# Patient Record
Sex: Female | Born: 1965 | Race: Black or African American | Hispanic: No | Marital: Married | Smoking: Never smoker
Health system: Southern US, Community
[De-identification: ages and names within clinical notes are randomized; demographics above are authoritative.]

## PROBLEM LIST (undated history)

## (undated) DIAGNOSIS — E7800 Pure hypercholesterolemia, unspecified: Secondary | ICD-10-CM

## (undated) DIAGNOSIS — I10 Essential (primary) hypertension: Secondary | ICD-10-CM

## (undated) DIAGNOSIS — E119 Type 2 diabetes mellitus without complications: Secondary | ICD-10-CM

## (undated) HISTORY — PX: TUBAL LIGATION: SHX77

## (undated) HISTORY — DX: Essential (primary) hypertension: I10

---
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
## 2005-11-16 ENCOUNTER — Ambulatory Visit: Payer: Self-pay | Admitting: Unknown Physician Specialty

## 2005-11-24 ENCOUNTER — Other Ambulatory Visit: Payer: Self-pay

## 2005-11-24 ENCOUNTER — Emergency Department: Payer: Self-pay | Admitting: Emergency Medicine

## 2006-01-23 ENCOUNTER — Ambulatory Visit: Payer: Self-pay | Admitting: Family Medicine

## 2008-03-24 ENCOUNTER — Ambulatory Visit: Payer: Self-pay | Admitting: Family Medicine

## 2008-04-27 ENCOUNTER — Ambulatory Visit: Payer: Self-pay | Admitting: Gastroenterology

## 2008-11-25 ENCOUNTER — Ambulatory Visit: Payer: Self-pay | Admitting: Family Medicine

## 2009-06-03 ENCOUNTER — Emergency Department: Payer: Self-pay | Admitting: Emergency Medicine

## 2009-09-16 ENCOUNTER — Ambulatory Visit: Payer: Self-pay | Admitting: Unknown Physician Specialty

## 2010-03-11 ENCOUNTER — Ambulatory Visit: Payer: Self-pay | Admitting: Emergency Medicine

## 2010-03-16 ENCOUNTER — Ambulatory Visit: Payer: Self-pay | Admitting: Emergency Medicine

## 2010-12-08 ENCOUNTER — Ambulatory Visit: Payer: Self-pay | Admitting: Family Medicine

## 2011-04-19 ENCOUNTER — Emergency Department: Payer: Self-pay | Admitting: Emergency Medicine

## 2011-05-01 ENCOUNTER — Ambulatory Visit: Payer: Self-pay | Admitting: Internal Medicine

## 2012-06-09 ENCOUNTER — Emergency Department: Payer: Self-pay | Admitting: Internal Medicine

## 2012-06-12 HISTORY — PX: BREAST CYST ASPIRATION: SHX578

## 2012-07-16 ENCOUNTER — Ambulatory Visit: Payer: Self-pay | Admitting: Physician Assistant

## 2012-07-30 ENCOUNTER — Ambulatory Visit: Payer: Self-pay | Admitting: Internal Medicine

## 2012-08-10 ENCOUNTER — Ambulatory Visit: Payer: Self-pay | Admitting: Internal Medicine

## 2012-09-10 ENCOUNTER — Ambulatory Visit: Payer: Self-pay | Admitting: Internal Medicine

## 2012-09-16 ENCOUNTER — Emergency Department: Payer: Self-pay | Admitting: Emergency Medicine

## 2013-08-28 ENCOUNTER — Ambulatory Visit: Payer: Self-pay

## 2014-01-14 ENCOUNTER — Emergency Department: Payer: Self-pay | Admitting: Emergency Medicine

## 2014-03-15 ENCOUNTER — Emergency Department: Payer: Self-pay | Admitting: Emergency Medicine

## 2014-11-17 ENCOUNTER — Other Ambulatory Visit: Payer: Self-pay | Admitting: Nurse Practitioner

## 2014-11-17 DIAGNOSIS — Z1231 Encounter for screening mammogram for malignant neoplasm of breast: Secondary | ICD-10-CM

## 2014-12-07 ENCOUNTER — Ambulatory Visit: Payer: Self-pay

## 2014-12-10 ENCOUNTER — Ambulatory Visit: Payer: Self-pay | Attending: Nurse Practitioner

## 2015-03-25 ENCOUNTER — Ambulatory Visit
Admission: RE | Admit: 2015-03-25 | Discharge: 2015-03-25 | Disposition: A | Discharge status: Home / Self Care | Payer: 59 | Source: Ambulatory Visit | Attending: Nurse Practitioner | Admitting: Nurse Practitioner

## 2015-03-25 DIAGNOSIS — Z1231 Encounter for screening mammogram for malignant neoplasm of breast: Secondary | ICD-10-CM

## 2015-06-30 ENCOUNTER — Ambulatory Visit
Admission: EM | Admit: 2015-06-30 | Discharge: 2015-06-30 | Disposition: A | Discharge status: Home / Self Care | Payer: 59 | Attending: Family Medicine | Admitting: Family Medicine

## 2015-06-30 DIAGNOSIS — H6593 Unspecified nonsuppurative otitis media, bilateral: Secondary | ICD-10-CM

## 2015-06-30 DIAGNOSIS — B349 Viral infection, unspecified: Secondary | ICD-10-CM | POA: Diagnosis not present

## 2015-06-30 DIAGNOSIS — J0100 Acute maxillary sinusitis, unspecified: Secondary | ICD-10-CM

## 2015-06-30 LAB — RAPID INFLUENZA A&B ANTIGENS
Influenza A (ARMC): NOT DETECTED
Influenza B (ARMC): NOT DETECTED

## 2015-06-30 MED ORDER — SALINE SPRAY 0.65 % NA SOLN: 2.0000 | NASAL | Status: DC

## 2015-06-30 MED ORDER — FLUTICASONE PROPIONATE 50 MCG/ACT NA SUSP: 1.0000 | Freq: Two times a day (BID) | NASAL | Status: DC

## 2015-06-30 NOTE — ED Notes (Signed)
And dry cough, general malaise x 1 week. Afebrile. Pt requesting flu test today.

## 2015-06-30 NOTE — Discharge Instructions (Signed)
Sinusitis, Adult °Sinusitis is redness, soreness, and inflammation of the paranasal sinuses. Paranasal sinuses are air pockets within the bones of your face. They are located beneath your eyes, in the middle of your forehead, and above your eyes. In healthy paranasal sinuses, mucus is able to drain out, and air is able to circulate through them by way of your nose. However, when your paranasal sinuses are inflamed, mucus and air can become trapped. This can allow bacteria and other germs to grow and cause infection. °Sinusitis can develop quickly and last only a short time (acute) or continue over a long period (chronic). Sinusitis that lasts for more than 12 weeks is considered chronic. °CAUSES °Causes of sinusitis include: °· Allergies. °· Structural abnormalities, such as displacement of the cartilage that separates your nostrils (deviated septum), which can decrease the air flow through your nose and sinuses and affect sinus drainage. °· Functional abnormalities, such as when the small hairs (cilia) that line your sinuses and help remove mucus do not work properly or are not present. °SIGNS AND SYMPTOMS °Symptoms of acute and chronic sinusitis are the same. The primary symptoms are pain and pressure around the affected sinuses. Other symptoms include: °· Upper toothache. °· Earache. °· Headache. °· Bad breath. °· Decreased sense of smell and taste. °· A cough, which worsens when you are lying flat. °· Fatigue. °· Fever. °· Thick drainage from your nose, which often is green and may contain pus (purulent). °· Swelling and warmth over the affected sinuses. °DIAGNOSIS °Your health care provider will perform a physical exam. During your exam, your health care provider may perform any of the following to help determine if you have acute sinusitis or chronic sinusitis: °· Look in your nose for signs of abnormal growths in your nostrils (nasal polyps). °· Tap over the affected sinus to check for signs of  infection. °· View the inside of your sinuses using an imaging device that has a light attached (endoscope). °If your health care provider suspects that you have chronic sinusitis, one or more of the following tests may be recommended: °· Allergy tests. °· Nasal culture. A sample of mucus is taken from your nose, sent to a lab, and screened for bacteria. °· Nasal cytology. A sample of mucus is taken from your nose and examined by your health care provider to determine if your sinusitis is related to an allergy. °TREATMENT °Most cases of acute sinusitis are related to a viral infection and will resolve on their own within 10 days. Sometimes, medicines are prescribed to help relieve symptoms of both acute and chronic sinusitis. These may include pain medicines, decongestants, nasal steroid sprays, or saline sprays. °However, for sinusitis related to a bacterial infection, your health care provider will prescribe antibiotic medicines. These are medicines that will help kill the bacteria causing the infection. °Rarely, sinusitis is caused by a fungal infection. In these cases, your health care provider will prescribe antifungal medicine. °For some cases of chronic sinusitis, surgery is needed. Generally, these are cases in which sinusitis recurs more than 3 times per year, despite other treatments. °HOME CARE INSTRUCTIONS °· Drink plenty of water. Water helps thin the mucus so your sinuses can drain more easily. °· Use a humidifier. °· Inhale steam 3-4 times a day (for example, sit in the bathroom with the shower running). °· Apply a warm, moist washcloth to your face 3-4 times a day, or as directed by your health care provider. °· Use saline nasal sprays to help   moisten and clean your sinuses.  Take medicines only as directed by your health care provider.  If you were prescribed either an antibiotic or antifungal medicine, finish it all even if you start to feel better. SEEK IMMEDIATE MEDICAL CARE IF:  You have  increasing pain or severe headaches.  You have nausea, vomiting, or drowsiness.  You have swelling around your face.  You have vision problems.  You have a stiff neck.  You have difficulty breathing.   This information is not intended to replace advice given to you by your health care provider. Make sure you discuss any questions you have with your health care provider.   Document Released: 05/29/2005 Document Revised: 06/19/2014 Document Reviewed: 06/13/2011 Elsevier Interactive Patient Education 2016 [employer]. Otitis Media With Effusion Otitis media with effusion is the presence of fluid in the middle ear. This is a common problem in children, which often follows ear infections. It may be present for weeks or longer after the infection. Unlike an acute ear infection, otitis media with effusion refers only to fluid behind the ear drum and not infection. Children with repeated ear and sinus infections and allergy problems are the most likely to get otitis media with effusion. CAUSES  The most frequent cause of the fluid buildup is dysfunction of the eustachian tubes. These are the tubes that drain fluid in the ears to the back of the nose (nasopharynx). SYMPTOMS   The main symptom of this condition is hearing loss. As a result, you or your child may:  Listen to the TV at a loud volume.  Not respond to questions.  Ask "what" often when spoken to.  Mistake or confuse one sound or word for another.  There may be a sensation of fullness or pressure but usually not pain. DIAGNOSIS   Your health care provider will diagnose this condition by examining you or your child's ears.  Your health care provider may test the pressure in you or your child's ear with a tympanometer.  A hearing test may be conducted if the problem persists. TREATMENT   Treatment depends on the duration and the effects of the effusion.  Antibiotics, decongestants, nose drops, and cortisone-type drugs  (tablets or nasal spray) may not be helpful.  Children with persistent ear effusions may have delayed language or behavioral problems. Children at risk for developmental delays in hearing, learning, and speech may require referral to a specialist earlier than children not at risk.  You or your child's health care provider may suggest a referral to an ear, nose, and throat surgeon for treatment. The following may help restore normal hearing:  Drainage of fluid.  Placement of ear tubes (tympanostomy tubes).  Removal of adenoids (adenoidectomy). HOME CARE INSTRUCTIONS   Avoid secondhand smoke.  Infants who are breastfed are less likely to have this condition.  Avoid feeding infants while they are lying flat.  Avoid known environmental allergens.  Avoid people who are sick. SEEK MEDICAL CARE IF:   Hearing is not better in 3 months.  Hearing is worse.  Ear pain.  Drainage from the ear.  Dizziness. MAKE SURE YOU:   Understand these instructions.  Will watch your condition.  Will get help right away if you are not doing well or get worse.   This information is not intended to replace advice given to you by your health care provider. Make sure you discuss any questions you have with your health care provider.   Document Released: 07/06/2004 Document Revised: 06/19/2014  Document Reviewed: 12/24/2012 Elsevier Interactive Patient Education 2016 [employer]. Viral Gastroenteritis Viral gastroenteritis is also known as stomach flu. This condition affects the stomach and intestinal tract. It can cause sudden diarrhea and vomiting. The illness typically lasts 3 to 8 days. Most people develop an immune response that eventually gets rid of the virus. While this natural response develops, the virus can make you quite ill. CAUSES  Many different viruses can cause gastroenteritis, such as rotavirus or noroviruses. You can catch one of these viruses by consuming contaminated food or  water. You may also catch a virus by sharing utensils or other personal items with an infected person or by touching a contaminated surface. SYMPTOMS  The most common symptoms are diarrhea and vomiting. These problems can cause a severe loss of body fluids (dehydration) and a body salt (electrolyte) imbalance. Other symptoms may include:  Fever.  Headache.  Fatigue.  Abdominal pain. DIAGNOSIS  Your caregiver can usually diagnose viral gastroenteritis based on your symptoms and a physical exam. A stool sample may also be taken to test for the presence of viruses or other infections. TREATMENT  This illness typically goes away on its own. Treatments are aimed at rehydration. The most serious cases of viral gastroenteritis involve vomiting so severely that you are not able to keep fluids down. In these cases, fluids must be given through an intravenous line (IV). HOME CARE INSTRUCTIONS   Drink enough fluids to keep your urine clear or pale yellow. Drink small amounts of fluids frequently and increase the amounts as tolerated.  Ask your caregiver for specific rehydration instructions.  Avoid:  Foods high in sugar.  Alcohol.  Carbonated drinks.  Tobacco.  Juice.  Caffeine drinks.  Extremely hot or cold fluids.  Fatty, greasy foods.  Too much intake of anything at one time.  Dairy products until 24 to 48 hours after diarrhea stops.  You may consume probiotics. Probiotics are active cultures of beneficial bacteria. They may lessen the amount and number of diarrheal stools in adults. Probiotics can be found in yogurt with active cultures and in supplements.  Wash your hands well to avoid spreading the virus.  Only take over-the-counter or prescription medicines for pain, discomfort, or fever as directed by your caregiver. Do not give aspirin to children. Antidiarrheal medicines are not recommended.  Ask your caregiver if you should continue to take your regular prescribed and  over-the-counter medicines.  Keep all follow-up appointments as directed by your caregiver. SEEK IMMEDIATE MEDICAL CARE IF:   You are unable to keep fluids down.  You do not urinate at least once every 6 to 8 hours.  You develop shortness of breath.  You notice blood in your stool or vomit. This may look like coffee grounds.  You have abdominal pain that increases or is concentrated in one small area (localized).  You have persistent vomiting or diarrhea.  You have a fever.  The patient is a child younger than 3 months, and he or she has a fever.  The patient is a child older than 3 months, and he or she has a fever and persistent symptoms.  The patient is a child older than 3 months, and he or she has a fever and symptoms suddenly get worse.  The patient is a baby, and he or she has no tears when crying. MAKE SURE YOU:   Understand these instructions.  Will watch your condition.  Will get help right away if you are not doing well or  get worse.   This information is not intended to replace advice given to you by your health care provider. Make sure you discuss any questions you have with your health care provider.   Document Released: 05/29/2005 Document Revised: 08/21/2011 Document Reviewed: 03/15/2011 Elsevier Interactive Patient Education 2016 [employer] Viral Infections A virus is a type of germ. Viruses can cause:  Minor sore throats.  Aches and pains.  Headaches.  Runny nose.  Rashes.  Watery eyes.  Tiredness.  Coughs.  Loss of appetite.  Feeling sick to your stomach (nausea).  Throwing up (vomiting).  Watery poop (diarrhea). HOME CARE   Only take medicines as told by your doctor.  Drink enough water and fluids to keep your pee (urine) clear or pale yellow. Sports drinks are a good choice.  Get plenty of rest and eat healthy. Soups and broths with crackers or rice are fine. GET HELP RIGHT AWAY IF:   You have a very bad  headache.  You have shortness of breath.  You have chest pain or neck pain.  You have an unusual rash.  You cannot stop throwing up.  You have watery poop that does not stop.  You cannot keep fluids down.  You or your child has a temperature by mouth above 102 F (38.9 C), not controlled by medicine.  Your baby is older than 3 months with a rectal temperature of 102 F (38.9 C) or higher.  Your baby is 283 months old or younger with a rectal temperature of 100.4 F (38 C) or higher. MAKE SURE YOU:   Understand these instructions.  Will watch this condition.  Will get help right away if you are not doing well or get worse.   This information is not intended to replace advice given to you by your health care provider. Make sure you discuss any questions you have with your health care provider.   Document Released: 05/11/2008 Document Revised: 08/21/2011 Document Reviewed: 11/04/2014 Elsevier Interactive Patient Education [employer]

## 2015-06-30 NOTE — ED Provider Notes (Signed)
CSN: [id number]     Arrival date & time 06/30/15  1901 History   First MD Initiated Contact with Patient 06/30/15 1941     Chief Complaint  Patient presents with  . Generalized Body Aches   (Consider location/radiation/quality/duration/timing/severity/associated sxs/prior Treatment) HPI Comments: Married african [place] female here for evaluation of nonproductive cough, body aches, frontal headache x 1 week.  Has tried motrin.  [facility] with [facility] infection started on amoxicillin 500mg  po BID ear pain and teeth pain resolved last dose tomorrow.  Has noticed bad taste in her mouth.  Emesis x 1 over the weekend.  Wants flu testing.  Worked today but feeling bad.  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  FHx Hypertension  PMHx diabetes PSHx denied  The history is provided by the patient.    No past medical history on file. Past Surgical History  Procedure Laterality Date  . Breast biopsy Right 1983    neg  . Breast cyst aspiration Right 2014    neg   No family history on file. Social History  Substance Use Topics  . Smoking status: Never Smoker   . Smokeless tobacco: None  . Alcohol Use: Yes     Comment: occasional   OB History    No data available     Review of Systems  Constitutional: Positive for fatigue. Negative for fever, chills, diaphoresis, activity change, appetite change and unexpected weight change.  HENT: Positive for congestion, ear pain, postnasal drip and sinus pressure. Negative for dental problem, drooling, ear discharge, facial swelling, hearing loss, mouth sores, nosebleeds, rhinorrhea, sneezing, sore throat, tinnitus, trouble swallowing and voice change.   Eyes: Negative for photophobia, pain, discharge, redness, itching and visual disturbance.  Respiratory: Positive for cough. Negative for choking, chest tightness, shortness of breath, wheezing and stridor.   Cardiovascular: Negative for chest pain, palpitations and leg  swelling.  Gastrointestinal: Positive for vomiting. Negative for nausea, abdominal pain, diarrhea, constipation, blood in stool and abdominal distention.  Endocrine: Negative for cold intolerance and heat intolerance.  Genitourinary: Negative for dysuria, hematuria and difficulty urinating.  Musculoskeletal: Positive for myalgias. Negative for back pain, joint swelling, arthralgias, gait problem, neck pain and neck stiffness.  Skin: Negative for color change, pallor, rash and wound.  Allergic/Immunologic: Positive for environmental allergies. Negative for food allergies.  Neurological: Positive for headaches. Negative for dizziness, tremors, seizures, syncope, facial asymmetry, speech difficulty, weakness, light-headedness and numbness.  Hematological: Negative for adenopathy. Does not bruise/bleed easily.  Psychiatric/Behavioral: Negative for behavioral problems, confusion, sleep disturbance and agitation.    Allergies  Review of patient's allergies indicates no known allergies.  Home Medications   Prior to Admission medications   Medication Sig Start Date End Date Taking? Authorizing Provider  atorvastatin (LIPITOR) 10 MG tablet Take 10 mg by mouth daily.   Yes Historical Provider, MD  fluticasone (FLONASE) 50 MCG/ACT nasal spray Place 1 spray into both nostrils 2 (two) times daily. 06/30/15 07/31/15  Barbaraann Barthel, NP  sodium chloride (OCEAN) 0.65 % SOLN nasal spray Place 2 sprays into both nostrils every 2 (two) hours while awake. 06/30/15   Barbaraann Barthel, NP   Meds Ordered and Administered this Visit  Medications - No data to display  BP 136/75 mmHg  Pulse 70  Temp(Src) 96.8 F (36 C)  Resp 16  Ht 5\' 6"  (1.676 m)  Wt 235 lb (106.595 kg)  BMI 37.95 kg/m2  SpO2 100%  LMP 06/17/2015 No data  found.   Physical Exam  Constitutional: She is oriented to person, place, and time. She appears well-developed and well-nourished. She is active and cooperative.  Non-toxic  appearance. She does not have a sickly appearance. She appears ill. No distress.  HENT:  Head: Normocephalic and atraumatic.  Right Ear: Hearing, external ear and ear canal normal. A middle ear effusion is present.  Left Ear: Hearing, external ear and ear canal normal. A middle ear effusion is present.  Nose: Mucosal edema and rhinorrhea present. No nose lacerations, sinus tenderness, nasal deformity, septal deviation or nasal septal hematoma. No epistaxis.  No foreign bodies. Right sinus exhibits maxillary sinus tenderness. Right sinus exhibits no frontal sinus tenderness. Left sinus exhibits maxillary sinus tenderness. Left sinus exhibits no frontal sinus tenderness.  Mouth/Throat: Uvula is midline and mucous membranes are normal. Mucous membranes are not pale, not dry and not cyanotic. She does not have dentures. No oral lesions. No trismus in the jaw. Normal dentition. No dental abscesses, uvula swelling, lacerations or dental caries. Posterior oropharyngeal edema and posterior oropharyngeal erythema present. No oropharyngeal exudate or tonsillar abscesses.  Bilateral TMs with air fluid level clear; vasculature inflamed bilaterally external canal slight erythema; cobblestoning posterior pharynx; bilateral nasal turbinates with edema erythema clear discharge, nasal congestion hoarse voice  Eyes: Conjunctivae, EOM and lids are normal. Pupils are equal, round, and reactive to light. Right eye exhibits no chemosis, no discharge, no exudate and no hordeolum. No foreign body present in the right eye. Left eye exhibits no chemosis, no discharge, no exudate and no hordeolum. No foreign body present in the left eye. Right conjunctiva is not injected. Right conjunctiva has no hemorrhage. Left conjunctiva is not injected. Left conjunctiva has no hemorrhage. No scleral icterus. Right eye exhibits normal extraocular motion and no nystagmus. Left eye exhibits normal extraocular motion and no nystagmus. Right pupil is  round and reactive. Left pupil is round and reactive. Pupils are equal.  Neck: Trachea normal and normal range of motion. Neck supple. No tracheal tenderness, no spinous process tenderness and no muscular tenderness present. No rigidity. No tracheal deviation, no edema, no erythema and normal range of motion present. No thyroid mass and no thyromegaly present.  Cardiovascular: Normal rate, regular rhythm, S1 normal, S2 normal, normal heart sounds and intact distal pulses.  PMI is not displaced.  Exam reveals no gallop and no friction rub.   No murmur heard. Pulmonary/Chest: Effort normal and breath sounds normal. No accessory muscle usage or stridor. No respiratory distress. She has no decreased breath sounds. She has no wheezes. She has no rhonchi. She has no rales. She exhibits no tenderness.  Abdominal: Soft. Normal appearance. She exhibits no shifting dullness, no distension, no pulsatile liver, no fluid wave, no abdominal bruit, no ascites, no pulsatile midline mass and no mass. Bowel sounds are decreased. There is no hepatosplenomegaly. There is no tenderness. There is no rigidity, no rebound, no guarding, no tenderness at McBurney's point and negative Murphy's sign. Hernia confirmed negative in the ventral area.  Musculoskeletal: Normal range of motion. She exhibits no edema or tenderness.       Right shoulder: Normal.       Left shoulder: Normal.       Right hip: Normal.       Left hip: Normal.       Right knee: Normal.       Left knee: Normal.       Cervical back: Normal.       Right  hand: Normal.       Left hand: Normal.  Lymphadenopathy:       Head (right side): No submental, no submandibular, no tonsillar, no preauricular, no posterior auricular and no occipital adenopathy present.       Head (left side): No submental, no submandibular, no tonsillar, no preauricular, no posterior auricular and no occipital adenopathy present.    She has no cervical adenopathy.       Right cervical: No  superficial cervical, no deep cervical and no posterior cervical adenopathy present.      Left cervical: No superficial cervical, no deep cervical and no posterior cervical adenopathy present.  Neurological: She is alert and oriented to person, place, and time. She has normal strength. She is not disoriented. She displays no atrophy and no tremor. No cranial nerve deficit or sensory deficit. She exhibits normal muscle tone. She displays no seizure activity. Coordination and gait normal. GCS eye subscore is 4. GCS verbal subscore is 5. GCS motor subscore is 6.  Skin: Skin is warm, dry and intact. No abrasion, no bruising, no burn, no ecchymosis, no laceration, no lesion, no petechiae and no rash noted. She is not diaphoretic. No cyanosis or erythema. No pallor. Nails show no clubbing.  Psychiatric: She has a normal mood and affect. Her speech is normal and behavior is normal. Judgment and thought content normal. Cognition and memory are normal.  Nursing note and vitals reviewed.   ED Course  Procedures (including critical care time)  Labs Review Labs Reviewed  RAPID INFLUENZA A&B ANTIGENS [facility] ONLY)    Imaging Review No results found.  2015 Discussed with patient rapid flu testing negative.  Other viral illness common in community.  48 hours work excuse given to patient.  Supportive care.  Patient verbalized understanding of information/instructions, agreed with plan of care and had no further questions at this time.    MDM   1. Viral illness   2. Acute maxillary sinusitis, recurrence not specified   3. Otitis media with effusion, bilateral    Supportive treatment.   No evidence of invasive bacterial infection, non toxic and well hydrated.  This is most likely self limiting viral infection.  I do not see where any further testing or imaging is necessary at this time.   I will suggest supportive care, rest, good hygiene and encourage the patient to take adequate fluids.  The patient is to  return to clinic or EMERGENCY ROOM if symptoms worsen or change significantly e.g. ear pain, fever, purulent discharge from ears or bleeding.  Exitcare handout on otitis media with effusion given to patient.  Patient verbalized agreement and understanding of treatment plan.    Patient notified rapid strep negative.  Suspect Viral illness: no evidence of invasive bacterial infection, non toxic and well hydrated.  This is most likely self limiting viral infection.  I do not see where any further testing or imaging is necessary at this time.   I will suggest supportive care, rest, good hygiene and encourage the patient to take adequate fluids.  48 hour work excuse given.  Notified patient staff will call with culture results once available next 48+ hours.  Sudafed  po q4-6h prn max  per 24 hours discussed may cause drowsiness avoid alcohol and driving until side effects known; flonase 1 spray each nostril BID prn, nasal saline 1-2 sprays each nostril prn q2h, motrin  po TID prn.  Discussed honey with lemon and salt water gargles for comfort also.  The patient is to return to clinic or EMERGENCY ROOM if symptoms worsen or change significantly e.g. fever, lethargy, SOB, wheezing.  Exitcare handout on viral illness given to patient.  Patient verbalized agreement and understanding of treatment plan.    Start flonase and saline. Rx given. Work excuse x 48 hours.  No evidence of systemic bacterial infection, non toxic and well hydrated.  I do not see where any further testing or imaging is necessary at this time.   I will suggest supportive care, rest, good hygiene and encourage the patient to take adequate fluids.  The patient is to return to clinic or EMERGENCY ROOM if symptoms worsen or change significantly.  Exitcare handout on sinusitis given to patient.  Patient verbalized agreement and understanding of treatment plan and had no further questions at this time.   P2:  Hand washing and cover  cough    Barbaraann Barthel, NP 06/30/15 2056

## 2016-01-16 ENCOUNTER — Ambulatory Visit
Admission: EM | Admit: 2016-01-16 | Discharge: 2016-01-16 | Disposition: A | Discharge status: Home / Self Care | Payer: 59 | Attending: Family Medicine | Admitting: Family Medicine

## 2016-01-16 DIAGNOSIS — H6981 Other specified disorders of Eustachian tube, right ear: Secondary | ICD-10-CM | POA: Diagnosis not present

## 2016-01-16 DIAGNOSIS — H9201 Otalgia, right ear: Secondary | ICD-10-CM

## 2016-01-16 MED ORDER — AMOXICILLIN-POT CLAVULANATE 875-125 MG PO TABS: 1.0000 | ORAL_TABLET | Freq: Two times a day (BID) | ORAL | 0 refills | Status: AC

## 2016-01-16 MED ORDER — FLUTICASONE PROPIONATE 50 MCG/ACT NA SUSP: 2.0000 | Freq: Every day | NASAL | 0 refills | Status: DC

## 2016-01-16 MED ORDER — FEXOFENADINE-PSEUDOEPHED ER 180-240 MG PO TB24
1.0000 | ORAL_TABLET | Freq: Every day | ORAL | 0 refills | Status: DC
Start: 2016-01-16 — End: 2021-01-14

## 2016-01-16 NOTE — ED Triage Notes (Signed)
Pt reports sx started on Friday with ears hurting and sore, bilateral neck soreness, drainage in throat, and mild headache. Pain 3/10. No fever, no Sore throat. No known sick contacts.

## 2016-01-16 NOTE — ED Provider Notes (Signed)
[facility]    CSN: [id number] Arrival date & time: 01/16/16  [id number]  First Provider Contact:  First MD Initiated Contact with Patient 01/16/16 [phone]        History   Chief Complaint Chief Complaint  Patient presents with  . Otalgia    HPI Lindsey Cruz is a 50 y.o. female.   Patient's here because of pain in the left ear. She states the pain started Friday night she reports pressure behind the left ear and pain going down her throat as well. She denies any dizziness and nausea. No fever either.    She does not smoke. She does not take any chronic medication she's had breast biopsies before otherwise no surgeries. No pertinent family medical history taking to today's visit and no known drug allergies.   The history is provided by the patient. No language interpreter was used.  Otalgia  Location:  Left Behind ear:  Swelling Quality:  Aching and pressure Severity:  Moderate Onset quality:  Sudden Timing:  Constant Progression:  Worsening Chronicity:  New Context: not direct blow, not elevation change, not foreign body in ear, not loud noise, not recent URI and not water in ear   Relieved by:  Nothing Ineffective treatments:  None tried Associated symptoms: congestion and headaches   Associated symptoms: no fever, no rash, no rhinorrhea and no sore throat   Risk factors: no recent travel, no chronic ear infection and no prior ear surgery     History reviewed. No pertinent past medical history.  There are no active problems to display for this patient.   Past Surgical History:  Procedure Laterality Date  . BREAST BIOPSY Right 1983   neg  . BREAST CYST ASPIRATION Right 2014   neg    OB History    No data available       Home Medications    Prior to Admission medications   Medication Sig Start Date End Date Taking? Authorizing Provider  atorvastatin (LIPITOR) 10 MG tablet Take 10 mg by mouth daily.   Yes Historical Provider, MD    amoxicillin-clavulanate (AUGMENTIN) 875-125 MG tablet Take 1 tablet by mouth 2 (two) times daily. May fill on 8/9 if needed until 9/1 01/19/16 02/11/16  Hassan RowanEugene Sanae Willetts, MD  fexofenadine-pseudoephedrine (ALLEGRA-D ALLERGY & CONGESTION) 180-240 MG 24 hr tablet Take 1 tablet by mouth daily. 01/16/16   Hassan RowanEugene Daniell Mancinas, MD  fluticasone (FLONASE) 50 MCG/ACT nasal spray Place 1 spray into both nostrils 2 (two) times daily. 06/30/15 07/31/15  Barbaraann Barthelina A Betancourt, NP  fluticasone (FLONASE) 50 MCG/ACT nasal spray Place 2 sprays into both nostrils daily. 01/16/16   Hassan RowanEugene Jevaughn Degollado, MD  sodium chloride (OCEAN) 0.65 % SOLN nasal spray Place 2 sprays into both nostrils every 2 (two) hours while awake. 06/30/15   Barbaraann Barthelina A Betancourt, NP    Family History History reviewed. No pertinent family history.  Social History Social History  Substance Use Topics  . Smoking status: Never Smoker  . Smokeless tobacco: Never Used  . Alcohol use Yes     Comment: occasional     Allergies   Review of patient's allergies indicates no known allergies.   Review of Systems Review of Systems  Constitutional: Negative for fever.  HENT: Positive for congestion and ear pain. Negative for rhinorrhea and sore throat.   Skin: Negative for rash.  Neurological: Positive for headaches.  All other systems reviewed and are negative.    Physical Exam Triage Vital Signs ED Triage Vitals  Enc Vitals Group     BP 01/16/16 0905 140/69     Pulse Rate 01/16/16 0905 70     Resp 01/16/16 0905 20     Temp 01/16/16 0905 98.1 F (36.7 C)     Temp Source 01/16/16 0905 Oral     SpO2 01/16/16 0905 99 %     Weight 01/16/16 0905 240 lb (108.9 kg)     Height 01/16/16 0905  (1.676 m)     Head Circumference --      Peak Flow --      Pain Score 01/16/16 0909 3     Pain Loc --      Pain Edu? --      Excl. in GC? --    No data found.   Updated Vital Signs BP 140/69 (BP Location: Left Arm)   Pulse 70   Temp 98.1 F (36.7 C) (Oral)   Resp 20    Ht  (1.676 m)   Wt 240 lb (108.9 kg)   LMP 12/26/2015   SpO2 99%   BMI 38.74 kg/m   Visual Acuity Right Eye Distance:   Left Eye Distance:   Bilateral Distance:    Right Eye Near:   Left Eye Near:    Bilateral Near:     Physical Exam  Constitutional: She is oriented to person, place, and time. She appears well-developed and well-nourished.  HENT:  Head: Normocephalic and atraumatic.  Nose: Nose normal.  Mouth/Throat: Oropharynx is clear and moist.  Eyes: Pupils are equal, round, and reactive to light.  Neck: Normal range of motion.  Cardiovascular: Normal rate and regular rhythm.   Pulmonary/Chest: Effort normal.  Musculoskeletal: Normal range of motion.  Neurological: She is alert and oriented to person, place, and time.  Skin: Skin is warm.  Psychiatric: She has a normal mood and affect.     UC Treatments / Results  Labs (all labs ordered are listed, but only abnormal results are displayed) Labs Reviewed - No data to display  EKG  EKG Interpretation None       Radiology No results found.  Procedures Procedures (including critical care time)  Medications Ordered in UC Medications - No data to display   Initial Impression / Assessment and Plan / UC Course  I have reviewed the triage vital signs and the nursing notes.  Pertinent labs & imaging results that were available during my care of the patient were reviewed by me and considered in my medical decision making (see chart for details).  Clinical Course    Patient instructed to do the  the Valsalva maneuver and  had improvement of her pressure and pain behind the left ear. Feel this consistent with eustachian tube dysfunction. We'll place on Allegra-D 1 tablet day Flonase nasal spray 2 puffs each nostril daily. If she is not better by Wednesday she can start Augmentin 875 twice a day for 10 days. Work note was given for today and tomorrow case it was needed.  Final Clinical Impressions(s) / UC  Diagnoses   Final diagnoses:  Eustachian tube dysfunction, right  Otalgia of right ear    New Prescriptions Discharge Medication List as of 01/16/2016  9:47 AM    START taking these medications   Details  amoxicillin-clavulanate (AUGMENTIN) 875-125 MG tablet Take 1 tablet by mouth 2 (two) times daily. May fill on 8/9 if needed until 9/1, Starting Wed 01/19/2016, Until Fri 02/11/2016, Normal    fexofenadine-pseudoephedrine (ALLEGRA-D ALLERGY & CONGESTION) 180-240  MG 24 hr tablet Take 1 tablet by mouth daily., Starting Sun 01/16/2016, Normal        Note: This dictation was prepared with Dragon dictation along with smaller phrase technology. Any transcriptional errors that result from this process are unintentional.   Hassan Rowan, MD 01/16/16 1039

## 2016-02-29 ENCOUNTER — Other Ambulatory Visit: Payer: Self-pay | Admitting: Nurse Practitioner

## 2016-02-29 DIAGNOSIS — Z1231 Encounter for screening mammogram for malignant neoplasm of breast: Secondary | ICD-10-CM

## 2016-03-27 ENCOUNTER — Ambulatory Visit
Admission: RE | Admit: 2016-03-27 | Discharge: 2016-03-27 | Disposition: A | Discharge status: Home / Self Care | Payer: 59 | Source: Ambulatory Visit | Attending: Nurse Practitioner | Admitting: Nurse Practitioner

## 2016-03-27 DIAGNOSIS — Z1231 Encounter for screening mammogram for malignant neoplasm of breast: Secondary | ICD-10-CM | POA: Diagnosis not present

## 2016-05-01 ENCOUNTER — Other Ambulatory Visit: Payer: Self-pay | Admitting: Internal Medicine

## 2016-05-01 DIAGNOSIS — N63 Unspecified lump in unspecified breast: Secondary | ICD-10-CM

## 2016-05-19 ENCOUNTER — Ambulatory Visit
Admission: RE | Admit: 2016-05-19 | Discharge: 2016-05-19 | Disposition: A | Discharge status: Home / Self Care | Payer: 59 | Source: Ambulatory Visit | Attending: Internal Medicine | Admitting: Internal Medicine

## 2016-05-19 DIAGNOSIS — N63 Unspecified lump in unspecified breast: Secondary | ICD-10-CM

## 2016-05-19 DIAGNOSIS — N6322 Unspecified lump in the left breast, upper inner quadrant: Secondary | ICD-10-CM | POA: Diagnosis not present

## 2016-05-19 DIAGNOSIS — N6320 Unspecified lump in the left breast, unspecified quadrant: Secondary | ICD-10-CM | POA: Diagnosis present

## 2016-07-28 ENCOUNTER — Encounter: Payer: Self-pay | Admitting: Emergency Medicine

## 2016-07-28 ENCOUNTER — Ambulatory Visit
Admission: EM | Admit: 2016-07-28 | Discharge: 2016-07-28 | Disposition: A | Discharge status: Home / Self Care | Payer: 59 | Attending: Family Medicine | Admitting: Family Medicine

## 2016-07-28 DIAGNOSIS — R69 Illness, unspecified: Secondary | ICD-10-CM

## 2016-07-28 DIAGNOSIS — J111 Influenza due to unidentified influenza virus with other respiratory manifestations: Secondary | ICD-10-CM

## 2016-07-28 HISTORY — DX: Type 2 diabetes mellitus without complications: E11.9

## 2016-07-28 HISTORY — DX: Pure hypercholesterolemia, unspecified: E78.00

## 2016-07-28 MED ORDER — OSELTAMIVIR PHOSPHATE 75 MG PO CAPS: 75.0000 mg | ORAL_CAPSULE | Freq: Two times a day (BID) | ORAL | 0 refills | Status: DC

## 2016-07-28 NOTE — ED Provider Notes (Signed)
[facility]    CSN: [id number] Arrival date & time: 07/28/16  1608     History   Chief Complaint Chief Complaint  Patient presents with  . Cough  . Generalized Body Aches    HPI Lindsey Cruz is a 51 y.o. female.   The history is provided by the patient.  Cough  Associated symptoms: fever and myalgias   Associated symptoms: no wheezing   URI  Presenting symptoms: congestion, cough, fatigue and fever   Onset quality:  Sudden Duration:  2 days Timing:  Constant Progression:  Worsening Chronicity:  New Relieved by:  None tried Ineffective treatments:  None tried Associated symptoms: myalgias   Associated symptoms: no wheezing   Risk factors: diabetes mellitus   Risk factors: not elderly, no chronic cardiac disease, no chronic kidney disease, no chronic respiratory disease, no immunosuppression, no recent illness, no recent travel and no sick contacts     Past Medical History:  Diagnosis Date  . Diabetes mellitus without complication (HCC)   . Hypercholesteremia     There are no active problems to display for this patient.   Past Surgical History:  Procedure Laterality Date  . BREAST CYST ASPIRATION Right 2014   neg  . BREAST EXCISIONAL BIOPSY Right 1983   neg  . TUBAL LIGATION      OB History    No data available       Home Medications    Prior to Admission medications   Medication Sig Start Date End Date Taking? Authorizing Provider  hydrochlorothiazide (MICROZIDE) 12.5 MG capsule Take 12.5 mg by mouth daily.   Yes Historical Provider, MD  atorvastatin (LIPITOR) 10 MG tablet Take 10 mg by mouth daily.    Historical Provider, MD  fexofenadine-pseudoephedrine (ALLEGRA-D ALLERGY & CONGESTION) 180-240 MG 24 hr tablet Take 1 tablet by mouth daily. 01/16/16   Hassan RowanEugene Wade, MD  fluticasone (FLONASE) 50 MCG/ACT nasal spray Place 1 spray into both nostrils 2 (two) times daily. 06/30/15 07/31/15  Barbaraann Barthelina A Betancourt, NP  oseltamivir (TAMIFLU) 75 MG  capsule Take 1 capsule (75 mg total) by mouth 2 (two) times daily. 07/28/16   Payton Mccallumrlando Jayshaun Phillips, MD  sodium chloride (OCEAN) 0.65 % SOLN nasal spray Place 2 sprays into both nostrils every 2 (two) hours while awake. 06/30/15   Barbaraann Barthelina A Betancourt, NP    Family History Family History  Problem Relation Age of Onset  . Breast cancer Neg Hx     Social History Social History  Substance Use Topics  . Smoking status: Never Smoker  . Smokeless tobacco: Never Used  . Alcohol use Yes     Comment: occasional     Allergies   Patient has no known allergies.   Review of Systems Review of Systems  Constitutional: Positive for fatigue and fever.  HENT: Positive for congestion.   Respiratory: Positive for cough. Negative for wheezing.   Musculoskeletal: Positive for myalgias.     Physical Exam Triage Vital Signs ED Triage Vitals  Enc Vitals Group     BP 07/28/16 1745 (!) 147/63     Pulse Rate 07/28/16 1745 66     Resp 07/28/16 1745 16     Temp 07/28/16 1745 98.3 F (36.8 C)     Temp Source 07/28/16 1745 Oral     SpO2 07/28/16 1745 99 %     Weight 07/28/16 1743 240 lb (108.9 kg)     Height 07/28/16 1743 5\' 6"  (1.676 m)     Head  Circumference --      Peak Flow --      Pain Score 07/28/16 1745 7     Pain Loc --      Pain Edu? --      Excl. in GC? --    No data found.   Updated Vital Signs BP (!) 147/63 (BP Location: Left Arm)   Pulse 66   Temp 98.3 F (36.8 C) (Oral)   Resp 16   Ht 5\' 6"  (1.676 m)   Wt 240 lb (108.9 kg)   LMP 07/14/2016 (Approximate)   SpO2 99%   BMI 38.74 kg/m   Visual Acuity Right Eye Distance:   Left Eye Distance:   Bilateral Distance:    Right Eye Near:   Left Eye Near:    Bilateral Near:     Physical Exam  Constitutional: She appears well-developed and well-nourished. No distress.  HENT:  Head: Normocephalic and atraumatic.  Right Ear: Tympanic membrane, external ear and ear canal normal.  Left Ear: Tympanic membrane, external ear and ear  canal normal.  Nose: Mucosal edema and rhinorrhea present. No nose lacerations, sinus tenderness, nasal deformity, septal deviation or nasal septal hematoma. No epistaxis.  No foreign bodies. Right sinus exhibits maxillary sinus tenderness and frontal sinus tenderness. Left sinus exhibits maxillary sinus tenderness and frontal sinus tenderness.  Mouth/Throat: Uvula is midline, oropharynx is clear and moist and mucous membranes are normal. No oropharyngeal exudate.  Eyes: Conjunctivae and EOM are normal. Pupils are equal, round, and reactive to light. Right eye exhibits no discharge. Left eye exhibits no discharge. No scleral icterus.  Neck: Normal range of motion. Neck supple. No thyromegaly present.  Cardiovascular: Normal rate, regular rhythm and normal heart sounds.   Pulmonary/Chest: Effort normal and breath sounds normal. No respiratory distress. She has no wheezes. She has no rales.  Lymphadenopathy:    She has no cervical adenopathy.  Skin: She is not diaphoretic.  Nursing note and vitals reviewed.    UC Treatments / Results  Labs (all labs ordered are listed, but only abnormal results are displayed) Labs Reviewed - No data to display  EKG  EKG Interpretation None       Radiology No results found.  Procedures Procedures (including critical care time)  Medications Ordered in UC Medications - No data to display   Initial Impression / Assessment and Plan / UC Course  I have reviewed the triage vital signs and the nursing notes.  Pertinent labs & imaging results that were available during my care of the patient were reviewed by me and considered in my medical decision making (see chart for details).      Final Clinical Impressions(s) / UC Diagnoses   Final diagnoses:  Influenza-like illness    New Prescriptions New Prescriptions   OSELTAMIVIR (TAMIFLU) 75 MG CAPSULE    Take 1 capsule (75 mg total) by mouth 2 (two) times daily.   1. diagnosis reviewed with  patient 2. rx as per orders above; reviewed possible side effects, interactions, risks and benefits  3. Recommend supportive treatment with rest, fluids  4. Follow-up prn if symptoms worsen or don't improve   Payton Mccallum, MD 07/28/16 1906

## 2016-07-28 NOTE — ED Triage Notes (Signed)
Patient c/o cough, bodyaches, congestion, chills since yesterday.

## 2016-12-20 ENCOUNTER — Emergency Department: Payer: 59

## 2016-12-20 ENCOUNTER — Emergency Department
Admission: EM | Admit: 2016-12-20 | Discharge: 2016-12-21 | Disposition: A | Discharge status: Home / Self Care | Payer: 59 | Attending: Emergency Medicine | Admitting: Emergency Medicine

## 2016-12-20 DIAGNOSIS — G43809 Other migraine, not intractable, without status migrainosus: Secondary | ICD-10-CM | POA: Diagnosis not present

## 2016-12-20 DIAGNOSIS — M79601 Pain in right arm: Secondary | ICD-10-CM | POA: Diagnosis not present

## 2016-12-20 DIAGNOSIS — M79605 Pain in left leg: Secondary | ICD-10-CM | POA: Insufficient documentation

## 2016-12-20 DIAGNOSIS — R51 Headache: Secondary | ICD-10-CM | POA: Diagnosis present

## 2016-12-20 LAB — COMPREHENSIVE METABOLIC PANEL
ALBUMIN: 3.7 g/dL (ref 3.5–5.0)
ALK PHOS: 43 U/L (ref 38–126)
ALT: 17 U/L (ref 14–54)
ANION GAP: 9 (ref 5–15)
AST: 25 U/L (ref 15–41)
BILIRUBIN TOTAL: 0.5 mg/dL (ref 0.3–1.2)
BUN: 15 mg/dL (ref 6–20)
CALCIUM: 9 mg/dL (ref 8.9–10.3)
CO2: 24 mmol/L (ref 22–32)
CREATININE: 0.89 mg/dL (ref 0.44–1.00)
Chloride: 103 mmol/L (ref 101–111)
GFR calc Af Amer: >60 mL/min (ref >60)
GFR calc non Af Amer: >60 mL/min (ref >60)
Glucose, Bld: 132 mg/dL — ABNORMAL HIGH (ref 65–99)
Potassium: 3.2 mmol/L — ABNORMAL LOW (ref 3.5–5.1)
Sodium: 136 mmol/L (ref 135–145)
TOTAL PROTEIN: 7 g/dL (ref 6.5–8.1)

## 2016-12-20 LAB — CBC
HEMATOCRIT: 35.6 % (ref 35.0–47.0)
HEMOGLOBIN: 12.2 g/dL (ref 12.0–16.0)
MCH: 31.5 pg (ref 26.0–34.0)
MCHC: 34.2 g/dL (ref 32.0–36.0)
MCV: 92 fL (ref 80.0–100.0)
Platelets: 194 10*3/uL (ref 150–440)
RBC: 3.87 MIL/uL (ref 3.80–5.20)
RDW: 13.2 % (ref 11.5–14.5)
WBC: 5.5 10*3/uL (ref 3.6–11.0)

## 2016-12-20 LAB — TROPONIN I: Troponin I: <0.03 ng/mL (ref <0.03)

## 2016-12-20 MED ORDER — DIPHENHYDRAMINE HCL 50 MG/ML IJ SOLN
12.5000 mg | INTRAMUSCULAR | Status: AC
  Administered 2016-12-21: 12.5 mg via INTRAVENOUS
  Filled 2016-12-20: qty 1

## 2016-12-20 MED ORDER — KETOROLAC TROMETHAMINE 30 MG/ML IJ SOLN: 15.0000 mg | Freq: Once | INTRAMUSCULAR | Status: DC

## 2016-12-20 MED ORDER — DEXAMETHASONE SODIUM PHOSPHATE 10 MG/ML IJ SOLN
10.0000 mg | Freq: Once | INTRAMUSCULAR | Status: AC
  Administered 2016-12-21: 10 mg via INTRAVENOUS
  Filled 2016-12-20: qty 1

## 2016-12-20 MED ORDER — POTASSIUM CHLORIDE 20 MEQ PO PACK
40.0000 meq | PACK | ORAL | Status: AC
Start: 2016-12-20 — End: 2016-12-21
  Administered 2016-12-21: 40 meq via ORAL
  Filled 2016-12-20: qty 2

## 2016-12-20 MED ORDER — SODIUM CHLORIDE 0.9 % IV BOLUS (SEPSIS)
500.0000 mL | INTRAVENOUS | Status: AC
  Administered 2016-12-21: 500 mL via INTRAVENOUS

## 2016-12-20 MED ORDER — METOCLOPRAMIDE HCL 5 MG/ML IJ SOLN
10.0000 mg | INTRAMUSCULAR | Status: AC
  Administered 2016-12-21: 10 mg via INTRAVENOUS
  Filled 2016-12-20: qty 2

## 2016-12-20 NOTE — ED Triage Notes (Signed)
Pt in with co right sided pain from right arm radiated to right leg, and now entire right side. Now headache, denies any numbness or hx of the same. Pt also between scapula, no chest pain.

## 2016-12-20 NOTE — ED Notes (Signed)
Pt went to CT

## 2016-12-20 NOTE — ED Provider Notes (Signed)
[facility] Emergency Department Provider Note  ____________________________________________   First MD Initiated Contact with Patient 12/20/16 2303     (approximate)  I have reviewed the triage vital signs and the nursing notes.   HISTORY  Chief Complaint Headache    HPI Lindsey Cruz is a 51 y.o. female who presents for evaluation of a variety of seemingly neurological complaints that began about 4 hours ago.  The symptoms started at around 7:30 PM and began as a sensation of "tingling pain and heaviness" in her right arm.  Gradually this developed into a right-sided headache felt most obviously in the posterior part of her head and her neck.  She then developed the similar sensation of heaviness and tingling in her left leg but not the left arm.  She feels no weakness  In her face and has observed no facial droop.  She has had no word finding difficulties.  She has had no difficulty with ambulation.  In spite of the sensation of heaviness in her right arm and left leg she has not had any difficulty with coordination and has not felt that appreciable loss of strength.  The symptoms started gradually and have gradually worsened and she currently describes them as moderate to severe.  She initially told triage about pain between her scapula but she described it to me more that it seems to be in her head and neck and then skipping her left arm and back and going down into her left leg.  She denies recent illnesses.  She denies fever/chills, chest pain, shortness of breath, nausea, vomiting, abdominal pain, dysuria.  Denies visual changes and photophobia.  Past Medical History:  Diagnosis Date  . Diabetes mellitus without complication (HCC)   . Hypercholesteremia     There are no active problems to display for this patient.   Past Surgical History:  Procedure Laterality Date  . BREAST CYST ASPIRATION Right 2014   neg  . BREAST EXCISIONAL BIOPSY Right 1983   neg  . TUBAL LIGATION      Prior to Admission medications   Medication Sig Start Date End Date Taking? Authorizing Provider  atorvastatin (LIPITOR) 10 MG tablet Take 10 mg by mouth daily.    [provider]  fexofenadine-pseudoephedrine (ALLEGRA-D ALLERGY & CONGESTION) 180-240 MG 24 hr tablet Take 1 tablet by mouth daily. 01/16/16   Hassan Rowan, MD  fluticasone (FLONASE) 50 MCG/ACT nasal spray Place 1 spray into both nostrils 2 (two) times daily. 06/30/15 07/31/15  Betancourt, Jarold Song, NP  hydrochlorothiazide (MICROZIDE) 12.5 MG capsule Take 12.5 mg by mouth daily.    [provider]  oseltamivir (TAMIFLU) 75 MG capsule Take 1 capsule (75 mg total) by mouth 2 (two) times daily. 07/28/16   Payton Mccallum, MD  sodium chloride (OCEAN) 0.65 % SOLN nasal spray Place 2 sprays into both nostrils every 2 (two) hours while awake. 06/30/15   Betancourt, Jarold Song, NP    Allergies Patient has no known allergies.  Family History  Problem Relation Age of Onset  . Breast cancer Neg Hx     Social History Social History  Substance Use Topics  . Smoking status: Never Smoker  . Smokeless tobacco: Never Used  . Alcohol use Yes     Comment: occasional    Review of Systems Constitutional: No fever/chills Eyes: No visual changes. ENT: No sore throat. Cardiovascular: Denies chest pain. Respiratory: Denies shortness of breath. Gastrointestinal: No abdominal pain.  No nausea, no vomiting.  No diarrhea.  No constipation. Genitourinary: Negative for dysuria. Musculoskeletal: Negative for neck pain.  Negative for back pain. Integumentary: Negative for rash. Neurological: Heaviness and tingling in RUE & LLE.  right sided posterior headache radiating into right side of neck   ____________________________________________   PHYSICAL EXAM:  VITAL SIGNS: ED Triage Vitals  Enc Vitals Group     BP 12/20/16 2215 (!) 157/86     Pulse Rate 12/20/16 2215 90     Resp 12/20/16 2215 20     Temp  12/20/16 2215 99.1 F (37.3 C)     Temp Source 12/20/16 2215 Oral     SpO2 12/20/16 2215 100 %     Weight 12/20/16 2212 108.9 kg (240 lb)     Height 12/20/16 2212 1.676 m (5\' 6" )     Head Circumference --      Peak Flow --      Pain Score 12/20/16 2212 8     Pain Loc --      Pain Edu? --      Excl. in GC? --     Constitutional: Alert and oriented. Well appearing and in no acute distress. Eyes: Conjunctivae are normal. PERRL. EOMI. Patient has no evidence of ptosis or Horner syndrome.  No nystagmus Head: Atraumatic. Nose: No congestion/rhinnorhea. Neck: No stridor.  No meningeal signs.  No carotid bruit  Cardiovascular: Normal rate, regular rhythm. Good peripheral circulation. Grossly normal heart sounds. Respiratory: Normal respiratory effort.  No retractions. Lungs CTAB. Gastrointestinal: Soft and nontender. No distention.  Musculoskeletal: No lower extremity tenderness nor edema. No gross deformities of extremities. Neurologic:  Normal speech and language. No gross focal neurologic deficits are appreciated; the patient has normal and equal grip strength and strength in major muscle groups of arms and legs.  She has no dysmetria on finger to nose testing.  She has no gross deficits of her cranial nerves Skin:  Skin is warm, dry and intact. No rash noted. Psychiatric: Mood and affect are normal. Speech and behavior are normal.  ____________________________________________   LABS (all labs ordered are listed, but only abnormal results are displayed)  Labs Reviewed  COMPREHENSIVE METABOLIC PANEL - Abnormal; Notable for the following:       Result Value   Potassium 3.2 (*)    Glucose, Bld 132 (*)    All other components within normal limits  CBC  TROPONIN I   ____________________________________________  EKG  ED ECG REPORT I, Tesia Lybrand, the attending physician, personally viewed and interpreted this ECG.  Date: 12/20/2016 EKG Time: 22:!5 Rate: 84 Rhythm: normal  sinus rhythm QRS Axis: normal Intervals: normal ST/T Wave abnormalities: normal Narrative Interpretation: unremarkable  ____________________________________________  RADIOLOGY   Ct Angio Head W Or Wo Contrast  Result Date: 12/21/2016 CLINICAL DATA:  Initial evaluation for right-sided head and neck pain, headache. EXAM: CT ANGIOGRAPHY HEAD AND NECK TECHNIQUE: Multidetector CT imaging of the head and neck was performed using the standard protocol during bolus administration of intravenous contrast. Multiplanar CT image reconstructions and MIPs were obtained to evaluate the vascular anatomy. Carotid stenosis measurements (when applicable) are obtained utilizing NASCET criteria, using the distal internal carotid diameter as the denominator. CONTRAST:  75 cc of Isovue 370. COMPARISON:  Prior noncontrast CT from 12/20/2016. FINDINGS: CTA NECK FINDINGS Aortic arch: Visualized aortic arch of normal caliber with normal 3 vessel morphology. No flow-limiting stenosis about the origin of the great vessels. Visualized subclavian artery is widely patent. Right carotid system: Right common and internal  carotid arteries are widely patent without stenosis, dissection or occlusion. No significant atheromatous narrowing about the right carotid bifurcation. Left carotid system: Left common and internal carotid arteries are widely patent without stenosis, dissection, or occlusion. No significant atheromatous narrowing about the left carotid bifurcation. Vertebral arteries: Both of the vertebral artery is arise from the subclavian arteries. Vertebral arteries widely patent within the neck without stenosis, dissection, or occlusion. Skeleton: No acute osseous abnormality. No worrisome lytic or blastic osseous lesions. Other neck: Soft tissues of the neck demonstrate no acute abnormality. Salivary glands within normal limits. Thyroid normal. No adenopathy. Upper chest: Visualized upper chest demonstrates no acute abnormality.  Partially visualized lungs are clear. Review of the MIP images confirms the above findings CTA HEAD FINDINGS Anterior circulation: Petrous, cavernous, supraclinoid segments of the internal carotid arteries are widely patent without flow-limiting stenosis. ICA termini widely patent. A1 segments patent bilaterally. Anterior communicating artery normal. Anterior cerebral artery is widely patent to their distal aspects. M1 segments patent without stenosis or occlusion. MCA bifurcations normal. No proximal M2 occlusion. Distal MCA branches well opacified and symmetric. Posterior circulation: Vertebral artery's widely patent to the vertebrobasilar junction. Left vertebral artery slightly dominant. Patent left PICA. Right PICA not well visualized. Basilar artery widely patent to its distal aspect. Superior cerebral arteries patent bilaterally. Posterior cerebral artery is widely patent to their distal aspects. Small bilateral posterior communicating arteries noted. Venous sinuses: Patent. Anatomic variants: No significant anatomic variant. No aneurysm or vascular malformation. Delayed phase: No pathologic enhancement. Review of the MIP images confirms the above findings IMPRESSION: Normal CTA of the head and neck. No acute vascular abnormality identified. No significant atheromatous disease or stenosis identified. Electronically Signed   By: Rise MuBenjamin  McClintock M.D.   On: 12/21/2016 01:24   Ct Head Wo Contrast  Result Date: 12/20/2016 CLINICAL DATA:  Right-sided pain from arm to leg, headache EXAM: CT HEAD WITHOUT CONTRAST TECHNIQUE: Contiguous axial images were obtained from the base of the skull through the vertex without intravenous contrast. COMPARISON:  03/15/2014 FINDINGS: Brain: No evidence of acute infarction, hemorrhage, hydrocephalus, extra-axial collection or mass lesion/mass effect. Vascular: No hyperdense vessel or unexpected calcification. Skull: Normal. Negative for fracture or focal lesion.  Sinuses/Orbits: Mucosal thickening in the sphenoid and ethmoid sinuses. No acute orbital abnormality. Other: None IMPRESSION: No CT evidence for acute intracranial abnormality. Electronically Signed   By: Jasmine PangKim  Fujinaga M.D.   On: 12/20/2016 22:34   Ct Angio Neck W And/or Wo Contrast  Result Date: 12/21/2016 CLINICAL DATA:  Initial evaluation for right-sided head and neck pain, headache. EXAM: CT ANGIOGRAPHY HEAD AND NECK TECHNIQUE: Multidetector CT imaging of the head and neck was performed using the standard protocol during bolus administration of intravenous contrast. Multiplanar CT image reconstructions and MIPs were obtained to evaluate the vascular anatomy. Carotid stenosis measurements (when applicable) are obtained utilizing NASCET criteria, using the distal internal carotid diameter as the denominator. CONTRAST:  75 cc of Isovue 370. COMPARISON:  Prior noncontrast CT from 12/20/2016. FINDINGS: CTA NECK FINDINGS Aortic arch: Visualized aortic arch of normal caliber with normal 3 vessel morphology. No flow-limiting stenosis about the origin of the great vessels. Visualized subclavian artery is widely patent. Right carotid system: Right common and internal carotid arteries are widely patent without stenosis, dissection or occlusion. No significant atheromatous narrowing about the right carotid bifurcation. Left carotid system: Left common and internal carotid arteries are widely patent without stenosis, dissection, or occlusion. No significant atheromatous narrowing about the left carotid bifurcation.  Vertebral arteries: Both of the vertebral artery is arise from the subclavian arteries. Vertebral arteries widely patent within the neck without stenosis, dissection, or occlusion. Skeleton: No acute osseous abnormality. No worrisome lytic or blastic osseous lesions. Other neck: Soft tissues of the neck demonstrate no acute abnormality. Salivary glands within normal limits. Thyroid normal. No adenopathy. Upper  chest: Visualized upper chest demonstrates no acute abnormality. Partially visualized lungs are clear. Review of the MIP images confirms the above findings CTA HEAD FINDINGS Anterior circulation: Petrous, cavernous, supraclinoid segments of the internal carotid arteries are widely patent without flow-limiting stenosis. ICA termini widely patent. A1 segments patent bilaterally. Anterior communicating artery normal. Anterior cerebral artery is widely patent to their distal aspects. M1 segments patent without stenosis or occlusion. MCA bifurcations normal. No proximal M2 occlusion. Distal MCA branches well opacified and symmetric. Posterior circulation: Vertebral artery's widely patent to the vertebrobasilar junction. Left vertebral artery slightly dominant. Patent left PICA. Right PICA not well visualized. Basilar artery widely patent to its distal aspect. Superior cerebral arteries patent bilaterally. Posterior cerebral artery is widely patent to their distal aspects. Small bilateral posterior communicating arteries noted. Venous sinuses: Patent. Anatomic variants: No significant anatomic variant. No aneurysm or vascular malformation. Delayed phase: No pathologic enhancement. Review of the MIP images confirms the above findings IMPRESSION: Normal CTA of the head and neck. No acute vascular abnormality identified. No significant atheromatous disease or stenosis identified. Electronically Signed   By: Rise Mu M.D.   On: 12/21/2016 01:24    ____________________________________________   PROCEDURES  Critical Care performed: No   Procedure(s) performed:   Procedures   ____________________________________________   INITIAL IMPRESSION / ASSESSMENT AND PLAN / ED COURSE  Pertinent labs & imaging results that were available during my care of the patient were reviewed by me and considered in my medical decision making (see chart for details).  The patient had a noncontrast CT scan of her  head before I arrived and it is normal.  Her neurological exam and physical exam in general is very reassuring, but as I told her and her family, her symptoms may be the result of a complicated migraine but it would be unusual for the symptoms to start in her right upper extremity, then moved to the head and neck, then end up in her left leg but without the involvement of her left arm.  Aneurysm, carotid dissection, migraine, and all usual and customary causes of emergency department presentations for headache as be considered.  We decided to treat the patient as a migraine but also obtain CTA head and neck to rule out to the best of my ability any acute intracranial and carotid pathology.  I will then reassess and we will decide how else to proceed.  I considered MRI but I feel that CTA is higher yield particularly for the carotid issue.  It would be unusual for a CVA to present  with "positive symptoms" such as pain and tingling, as opposed to "negative symptoms" such as weakness and numbness.  I will reassess after imaging and the patient and family are comfortable with this plan.   Clinical Course as of Dec 22 451  Thu Dec 21, 2016  0244 The patient's CT angiogram of head and neck were both reassuring and negative for acute abnormalities.  She has been sleeping comfortably after migraine treatment.  I awoke her and she says she feels much better with only some slight residual head pressure but that it is very manageable and  she is ready to go home.  We discussed the results of her imaging and encouraged close outpatient follow-up.  I gave my usual and customary return precautions.  She understands and agrees with the plan.  [CF]    Clinical Course User Index [CF] Loleta Rose, MD    ____________________________________________  FINAL CLINICAL IMPRESSION(S) / ED DIAGNOSES  Final diagnoses:  Other migraine without status migrainosus, not intractable  Right arm pain  Left leg pain      MEDICATIONS GIVEN DURING THIS VISIT:  Medications  potassium chloride (KLOR-CON) packet 40 mEq (40 mEq Oral Given 12/21/16 0046)  metoCLOPramide (REGLAN) injection 10 mg (10 mg Intravenous Given 12/21/16 0045)  dexamethasone (DECADRON) injection 10 mg (10 mg Intravenous Given 12/21/16 0041)  diphenhydrAMINE (BENADRYL) injection 12.5 mg (12.5 mg Intravenous Given 12/21/16 0043)  sodium chloride 0.9 % bolus 500 mL (0 mLs Intravenous Stopped 12/21/16 0239)  iopamidol (ISOVUE-370) 76 % injection 75 mL (75 mLs Intravenous Contrast Given 12/21/16 0010)     NEW OUTPATIENT MEDICATIONS STARTED DURING THIS VISIT:  Discharge Medication List as of 12/21/2016  2:46 AM      Discharge Medication List as of 12/21/2016  2:46 AM      Discharge Medication List as of 12/21/2016  2:46 AM       Note:  This document was prepared using Dragon voice recognition software and may include unintentional dictation errors.    Loleta Rose, MD 12/21/16 [phone]

## 2016-12-21 ENCOUNTER — Encounter: Payer: Self-pay | Admitting: Radiology

## 2016-12-21 ENCOUNTER — Emergency Department: Payer: 59

## 2016-12-21 MED ORDER — IOPAMIDOL (ISOVUE-370) INJECTION 76%
75.0000 mL | Freq: Once | INTRAVENOUS | Status: AC | PRN
  Administered 2016-12-21: 75 mL via INTRAVENOUS

## 2016-12-21 NOTE — Discharge Instructions (Signed)
You have been seen in the Emergency Department (ED) for what we believe was a complicated migraine.  Your workup was reassuring, including CT angiograms (and CT head without contrast) of your head and neck.  Please use Tylenol or Motrin as needed for symptoms, but only as written on the box, and take any regular medications that have been prescribed for you.  As we have discussed, please follow up with your doctor as soon as possible regarding today?s ED visit and your headache symptoms.    Call your doctor or return to the Emergency Department (ED) if you have a worsening headache, sudden and severe headache, confusion, slurred speech, facial droop, weakness or numbness in any arm or leg, extreme fatigue, or other symptoms that concern you.

## 2017-03-27 ENCOUNTER — Other Ambulatory Visit: Payer: Self-pay | Admitting: Nurse Practitioner

## 2017-03-27 DIAGNOSIS — Z1231 Encounter for screening mammogram for malignant neoplasm of breast: Secondary | ICD-10-CM

## 2017-04-10 ENCOUNTER — Ambulatory Visit
Admission: RE | Admit: 2017-04-10 | Discharge: 2017-04-10 | Disposition: A | Discharge status: Home / Self Care | Payer: 59 | Source: Ambulatory Visit | Attending: Nurse Practitioner | Admitting: Nurse Practitioner

## 2017-04-10 DIAGNOSIS — Z1231 Encounter for screening mammogram for malignant neoplasm of breast: Secondary | ICD-10-CM | POA: Insufficient documentation

## 2017-07-20 ENCOUNTER — Encounter: Payer: Self-pay | Admitting: Nurse Practitioner

## 2017-07-20 DIAGNOSIS — Z20828 Contact with and (suspected) exposure to other viral communicable diseases: Secondary | ICD-10-CM | POA: Diagnosis not present

## 2017-07-20 DIAGNOSIS — R6889 Other general symptoms and signs: Secondary | ICD-10-CM | POA: Diagnosis not present

## 2017-08-23 ENCOUNTER — Encounter: Payer: Self-pay | Admitting: Nurse Practitioner

## 2017-08-23 ENCOUNTER — Other Ambulatory Visit: Payer: Self-pay

## 2017-08-23 MED ORDER — HYDROCHLOROTHIAZIDE 12.5 MG PO CAPS: 12.5000 mg | ORAL_CAPSULE | Freq: Every day | ORAL | 5 refills | Status: DC

## 2017-09-12 ENCOUNTER — Encounter: Payer: Self-pay | Admitting: Internal Medicine

## 2017-09-26 ENCOUNTER — Encounter: Payer: Self-pay | Admitting: Internal Medicine

## 2017-09-26 ENCOUNTER — Ambulatory Visit: Payer: BLUE CROSS/BLUE SHIELD | Admitting: Internal Medicine

## 2017-09-26 ENCOUNTER — Other Ambulatory Visit: Payer: Self-pay | Admitting: Internal Medicine

## 2017-09-26 VITALS — BP 134/75 | HR 85 | Resp 16 | Ht 66.0 in | Wt 248.8 lb

## 2017-09-26 DIAGNOSIS — R300 Dysuria: Secondary | ICD-10-CM

## 2017-09-26 DIAGNOSIS — E782 Mixed hyperlipidemia: Secondary | ICD-10-CM

## 2017-09-26 DIAGNOSIS — Z124 Encounter for screening for malignant neoplasm of cervix: Secondary | ICD-10-CM

## 2017-09-26 DIAGNOSIS — I10 Essential (primary) hypertension: Secondary | ICD-10-CM

## 2017-09-26 DIAGNOSIS — Z0001 Encounter for general adult medical examination with abnormal findings: Secondary | ICD-10-CM

## 2017-09-26 DIAGNOSIS — E1165 Type 2 diabetes mellitus with hyperglycemia: Secondary | ICD-10-CM

## 2017-09-26 LAB — POCT GLYCOSYLATED HEMOGLOBIN (HGB A1C): HEMOGLOBIN A1C: 6.9

## 2017-09-26 MED ORDER — OMEPRAZOLE 20 MG PO CPDR: 20.0000 mg | DELAYED_RELEASE_CAPSULE | Freq: Every day | ORAL | 3 refills | Status: DC

## 2017-09-26 MED ORDER — BISOPROLOL-HYDROCHLOROTHIAZIDE 2.5-6.25 MG PO TABS: 1.0000 | ORAL_TABLET | Freq: Every day | ORAL | 3 refills | Status: DC

## 2017-09-26 MED ORDER — ATORVASTATIN CALCIUM 10 MG PO TABS: 10.0000 mg | ORAL_TABLET | Freq: Every day | ORAL | 3 refills | Status: DC

## 2017-09-26 NOTE — Progress Notes (Signed)
[facility] [address] [place], [place] [address]  Internal MEDICINE  Office Visit Note  Patient Name: Lindsey Cruz  [id number]  [id number]  Date of Service: 09/26/2017  Chief Complaint  Patient presents with  . Annual Exam  . Diabetes  . Hypertension     HPI Pt is here for routine health maintenance examination  Current Medication: Outpatient Encounter Medications as of 09/26/2017  Medication Sig  . fexofenadine-pseudoephedrine (ALLEGRA-D ALLERGY & CONGESTION) 180-240 MG 24 hr tablet Take 1 tablet by mouth daily.  . hydrochlorothiazide (MICROZIDE) 12.5 MG capsule Take 1 capsule (12.5 mg total) by mouth daily.  . [DISCONTINUED] atorvastatin (LIPITOR) 10 MG tablet Take 10 mg by mouth daily.  Marland Kitchen atorvastatin (LIPITOR) 10 MG tablet Take 1 tablet (10 mg total) by mouth daily.  . bisoprolol-hydrochlorothiazide (ZIAC) 2.5-6.25 MG tablet Take 1 tablet by mouth daily.  . fluticasone (FLONASE) 50 MCG/ACT nasal spray Place 1 spray into both nostrils 2 (two) times daily.  Marland Kitchen omeprazole (PRILOSEC) 20 MG capsule Take 1 capsule (20 mg total) by mouth daily.  . sodium chloride (OCEAN) 0.65 % SOLN nasal spray Place 2 sprays into both nostrils every 2 (two) hours while awake.  . [DISCONTINUED] bisoprolol-hydrochlorothiazide (ZIAC) 2.5-6.25 MG tablet   . [DISCONTINUED] oseltamivir (TAMIFLU) 75 MG capsule Take 1 capsule (75 mg total) by mouth 2 (two) times daily. (Patient not taking: Reported on 09/26/2017)   No facility-administered encounter medications on file as of 09/26/2017.     Surgical History: Past Surgical History:  Procedure Laterality Date  . BREAST CYST ASPIRATION Right 2014   neg  . BREAST EXCISIONAL BIOPSY Right 1983   neg  . TUBAL LIGATION      Medical History: Past Medical History:  Diagnosis Date  . Diabetes mellitus without complication (HCC)   . Hypercholesteremia   . Hypertension     Family History: Family History  Problem Relation Age of Onset   . Hypertension Mother   . Hyperlipidemia Mother   . Diabetes Mother   . Diabetes Maternal Grandmother       Review of Systems  Constitutional: Negative for chills, fatigue and unexpected weight change.  HENT: Positive for postnasal drip. Negative for congestion, rhinorrhea, sneezing and sore throat.   Eyes: Negative for redness.  Respiratory: Negative for cough, chest tightness and shortness of breath.   Cardiovascular: Negative for chest pain and palpitations.  Gastrointestinal: Negative for abdominal pain, constipation, diarrhea, nausea and vomiting.  Genitourinary: Negative for dysuria and frequency.  Musculoskeletal: Negative for arthralgias, back pain, joint swelling and neck pain.  Skin: Negative for rash.  Neurological: Negative.  Negative for tremors and numbness.  Hematological: Negative for adenopathy. Does not bruise/bleed easily.  Psychiatric/Behavioral: Negative for behavioral problems (Depression), sleep disturbance and suicidal ideas. The patient is not nervous/anxious.    Vital Signs: BP 134/75   Pulse 85   Resp 16   Ht 5\' 6"  (1.676 m)   Wt 248 lb 12.8 oz (112.9 kg)   SpO2 97%   BMI 40.16 kg/m    Physical Exam  Constitutional: She appears well-developed and well-nourished. No distress.  HENT:  Head: Normocephalic and atraumatic.  Right Ear: External ear normal.  Left Ear: External ear normal.  Nose: Nose normal.  Mouth/Throat: Oropharynx is clear and moist. No oropharyngeal exudate.  Eyes: Pupils are equal, round, and reactive to light. Conjunctivae and EOM are normal. Right eye exhibits no discharge. Left eye exhibits no discharge. No scleral icterus.  Neck: Normal  range of motion. Neck supple. No JVD present. No tracheal deviation present. No thyromegaly present.  Cardiovascular: Normal rate, regular rhythm, normal heart sounds and intact distal pulses. Exam reveals no gallop and no friction rub.  No murmur heard. Pulmonary/Chest: Effort normal and  breath sounds normal. No stridor. No respiratory distress. She has no wheezes. She has no rales. She exhibits no tenderness.  Abdominal: Soft. Bowel sounds are normal. She exhibits no distension and no mass. There is no tenderness. There is no rebound and no guarding.  Musculoskeletal: Normal range of motion. She exhibits no edema, tenderness or deformity.  Lymphadenopathy:    She has no cervical adenopathy.  Neurological: She is alert. She has normal reflexes. No cranial nerve deficit. She exhibits normal muscle tone. Coordination normal.  Skin: Skin is warm and dry. No rash noted. She is not diaphoretic. No erythema. No pallor.  Psychiatric: She has a normal mood and affect. Her behavior is normal. Judgment and thought content normal.   LABS: Recent Results (from the past 2160 hour(s))  POCT HgB A1C     Status: None   Collection Time: 09/26/17 11:12 AM  Result Value Ref Range   Hemoglobin A1C 6.9    Assessment/Plan: 1. Encounter for general adult medical examination with abnormal findings - CBC with Differential/Platelet - FSH/LH  2. Routine cervical smear - Pap IG and HPV (high risk) DNA detection  3. Uncontrolled type 2 diabetes mellitus with hyperglycemia (HCC) - Continue to monitor  - POCT HgB A1C - TSH - T4, free  4. Essential hypertension, benign - bisoprolol-hydrochlorothiazide (ZIAC) 2.5-6.25 MG tablet; Take 1 tablet by mouth daily.  Dispense: 90 tablet; Refill: 3 - Comprehensive metabolic panel  5. Dysuria - Urinalysis, Routine w reflex microscopic/ urine for microalbumine  6. Mixed hyperlipidemia - Continue  - atorvastatin (LIPITOR) 10 MG tablet; Take 1 tablet (10 mg total) by mouth daily.  Dispense: 90 tablet; Refill: 3 - Lipid Panel With LDL/HDL Ratio  General Counseling: Lindsey Cruz verbalizes understanding of the findings of todays visit and agrees with plan of treatment. I have discussed any further diagnostic evaluation that may be needed or ordered today. We  also reviewed her medications today. she has been encouraged to call the office with any questions or concerns that should arise related to todays visit. Diabetes Counseling:  1. Addition of ACE inh/ ARB'S for nephroprotection. 2. Diabetic foot care, prevention of complications.  3.Exercise and lose weight.  4. Diabetic eye examination, 5. Monitor blood sugar closlely. nutrition counseling.  6.Sign and symptoms of hypoglycemia including shaking sweating,confusion and headaches.    Orders Placed This Encounter  Procedures  . Urinalysis, Routine w reflex microscopic  . CBC with Differential/Platelet  . Lipid Panel With LDL/HDL Ratio  . TSH  . T4, free  . Comprehensive metabolic panel  . POCT HgB A1C    Meds ordered this encounter  Medications  . bisoprolol-hydrochlorothiazide (ZIAC) 2.5-6.25 MG tablet    Sig: Take 1 tablet by mouth daily.    Dispense:  90 tablet    Refill:  3  . atorvastatin (LIPITOR) 10 MG tablet    Sig: Take 1 tablet (10 mg total) by mouth daily.    Dispense:  90 tablet    Refill:  3  . omeprazole (PRILOSEC) 20 MG capsule    Sig: Take 1 capsule (20 mg total) by mouth daily.    Dispense:  90 capsule    Refill:  3    Time spent:25 Minutes  Lavera Guise, MD  Internal Medicine

## 2017-09-27 LAB — URINALYSIS, ROUTINE W REFLEX MICROSCOPIC
Bilirubin, UA: NEGATIVE
GLUCOSE, UA: NEGATIVE
Leukocytes, UA: NEGATIVE
NITRITE UA: NEGATIVE
Protein, UA: NEGATIVE
RBC UA: NEGATIVE
SPEC GRAV UA: 1.024 (ref 1.005–1.030)
UUROB: 1 mg/dL (ref 0.2–1.0)
pH, UA: 6.5 (ref 5.0–7.5)

## 2017-09-29 LAB — PAP IG AND HPV HIGH-RISK
HPV, HIGH-RISK: NEGATIVE
PAP SMEAR COMMENT: 0

## 2017-10-12 ENCOUNTER — Other Ambulatory Visit: Payer: Self-pay

## 2017-10-12 MED ORDER — HYDROCHLOROTHIAZIDE 12.5 MG PO CAPS: 12.5000 mg | ORAL_CAPSULE | Freq: Every day | ORAL | 5 refills | Status: DC

## 2017-10-16 DIAGNOSIS — E782 Mixed hyperlipidemia: Secondary | ICD-10-CM | POA: Diagnosis not present

## 2017-10-16 DIAGNOSIS — E1165 Type 2 diabetes mellitus with hyperglycemia: Secondary | ICD-10-CM | POA: Diagnosis not present

## 2017-10-16 DIAGNOSIS — Z0001 Encounter for general adult medical examination with abnormal findings: Secondary | ICD-10-CM | POA: Diagnosis not present

## 2017-10-16 DIAGNOSIS — I10 Essential (primary) hypertension: Secondary | ICD-10-CM | POA: Diagnosis not present

## 2017-10-17 ENCOUNTER — Other Ambulatory Visit: Payer: Self-pay

## 2017-10-17 LAB — LIPID PANEL WITH LDL/HDL RATIO
CHOLESTEROL TOTAL: 160 mg/dL (ref 100–199)
HDL: 32 mg/dL — AB (ref >39)
LDL Calculated: 104 mg/dL — ABNORMAL HIGH (ref 0–99)
LDl/HDL Ratio: 3.3 ratio — ABNORMAL HIGH (ref 0.0–3.2)
Triglycerides: 120 mg/dL (ref 0–149)
VLDL CHOLESTEROL CAL: 24 mg/dL (ref 5–40)

## 2017-10-17 LAB — COMPREHENSIVE METABOLIC PANEL
A/G RATIO: 1.6 (ref 1.2–2.2)
ALT: 14 IU/L (ref 0–32)
AST: 12 IU/L (ref 0–40)
Albumin: 4.2 g/dL (ref 3.5–5.5)
Alkaline Phosphatase: 51 IU/L (ref 39–117)
BUN/Creatinine Ratio: 14 (ref 9–23)
BUN: 12 mg/dL (ref 6–24)
Bilirubin Total: 0.3 mg/dL (ref 0.0–1.2)
CALCIUM: 9.4 mg/dL (ref 8.7–10.2)
CO2: 24 mmol/L (ref 20–29)
CREATININE: 0.86 mg/dL (ref 0.57–1.00)
Chloride: 104 mmol/L (ref 96–106)
GFR, EST AFRICAN AMERICAN: 91 mL/min/{1.73_m2} (ref >59)
GFR, EST NON AFRICAN AMERICAN: 79 mL/min/{1.73_m2} (ref >59)
GLOBULIN, TOTAL: 2.7 g/dL (ref 1.5–4.5)
Glucose: 121 mg/dL — ABNORMAL HIGH (ref 65–99)
POTASSIUM: 4.7 mmol/L (ref 3.5–5.2)
Sodium: 141 mmol/L (ref 134–144)
TOTAL PROTEIN: 6.9 g/dL (ref 6.0–8.5)

## 2017-10-17 LAB — CBC WITH DIFFERENTIAL/PLATELET
BASOS ABS: 0 10*3/uL (ref 0.0–0.2)
Basos: 0 %
EOS (ABSOLUTE): 0.1 10*3/uL (ref 0.0–0.4)
Eos: 3 %
Hematocrit: 38 % (ref 34.0–46.6)
Hemoglobin: 12.6 g/dL (ref 11.1–15.9)
IMMATURE GRANS (ABS): 0 10*3/uL (ref 0.0–0.1)
IMMATURE GRANULOCYTES: 0 %
LYMPHS: 37 %
Lymphocytes Absolute: 2 10*3/uL (ref 0.7–3.1)
MCH: 30.3 pg (ref 26.6–33.0)
MCHC: 33.2 g/dL (ref 31.5–35.7)
MCV: 91 fL (ref 79–97)
Monocytes Absolute: 0.6 10*3/uL (ref 0.1–0.9)
Monocytes: 11 %
NEUTROS PCT: 49 %
Neutrophils Absolute: 2.7 10*3/uL (ref 1.4–7.0)
PLATELETS: 247 10*3/uL (ref 150–379)
RBC: 4.16 x10E6/uL (ref 3.77–5.28)
RDW: 13.5 % (ref 12.3–15.4)
WBC: 5.5 10*3/uL (ref 3.4–10.8)

## 2017-10-17 LAB — TSH: TSH: 1.05 u[IU]/mL (ref 0.450–4.500)

## 2017-10-17 LAB — FSH/LH
FSH: 7.5 m[IU]/mL
LH: 10.4 m[IU]/mL

## 2017-10-17 LAB — T4, FREE: Free T4: 1.27 ng/dL (ref 0.82–1.77)

## 2017-10-19 ENCOUNTER — Other Ambulatory Visit: Payer: Self-pay

## 2017-10-24 ENCOUNTER — Telehealth: Payer: Self-pay

## 2017-10-24 NOTE — Telephone Encounter (Signed)
Sherwin from [facility] called in regards to pt taking trulicity. I checked last visit note and both medication lists on here and IMS. Trulicity was not listed anywhere. He was notified of this.

## 2017-10-25 ENCOUNTER — Other Ambulatory Visit: Payer: Self-pay

## 2017-10-25 ENCOUNTER — Telehealth: Payer: Self-pay

## 2017-10-25 DIAGNOSIS — I10 Essential (primary) hypertension: Secondary | ICD-10-CM

## 2017-10-25 MED ORDER — BISOPROLOL-HYDROCHLOROTHIAZIDE 2.5-6.25 MG PO TABS: 1.0000 | ORAL_TABLET | Freq: Every day | ORAL | 3 refills | Status: DC

## 2017-10-25 MED ORDER — HYDROCHLOROTHIAZIDE 12.5 MG PO CAPS: 12.5000 mg | ORAL_CAPSULE | Freq: Every day | ORAL | 3 refills | Status: DC

## 2017-10-25 MED ORDER — DULAGLUTIDE 0.75 MG/0.5ML ~~LOC~~ SOAJ: 0.7500 mg | SUBCUTANEOUS | 1 refills | Status: DC

## 2017-10-25 MED ORDER — DULAGLUTIDE 0.75 MG/0.5ML ~~LOC~~ SOAJ: 0.7500 mg | SUBCUTANEOUS | 3 refills | Status: DC

## 2017-10-25 NOTE — Telephone Encounter (Signed)
Left vm on both pt's phone numbers.  Need clarification if pt is taking MICROZIDE 12.5 MG and the BISO FUM/HCT TAB 2.5-6.25MG  or just one.  dbs

## 2017-11-30 ENCOUNTER — Other Ambulatory Visit: Payer: Self-pay | Admitting: Internal Medicine

## 2017-12-03 ENCOUNTER — Ambulatory Visit
Admission: EM | Admit: 2017-12-03 | Discharge: 2017-12-03 | Disposition: A | Discharge status: Home / Self Care | Payer: BLUE CROSS/BLUE SHIELD | Attending: Internal Medicine | Admitting: Internal Medicine

## 2017-12-03 DIAGNOSIS — J069 Acute upper respiratory infection, unspecified: Secondary | ICD-10-CM

## 2017-12-03 MED ORDER — BENZONATATE 200 MG PO CAPS: 200.0000 mg | ORAL_CAPSULE | Freq: Three times a day (TID) | ORAL | 1 refills | Status: DC | PRN

## 2017-12-03 NOTE — ED Triage Notes (Addendum)
Pt here for productive cough, eyes itching, nasal drainage and lack of energy since Thursday. Has been taking mucinex without relief except breaking up the mucus. No fever reported. Also reports she think she has pulled a muscle in her lower abdomen from coughing.

## 2017-12-03 NOTE — Discharge Instructions (Addendum)
No danger signs on exam today.  Symptoms of cough, achiness, headache, seem most likely to be due to a summer virus.  Anticipate gradual improvement in cough and well being over the next several days.  Ibuprofen as needed for achiness.  Prescription for tessalon (benzonatate, for cough) was sent to the pharmacy; coricidin is also a good choice.  Heat for 5-10 minutes several times daily may help decrease abdominal wall pain from coughing.  Ice is ok too, see what feels the best.

## 2017-12-04 NOTE — ED Provider Notes (Signed)
[facility]    CSN: [id number] Arrival date & time: 12/03/17  1825     History   Chief Complaint Chief Complaint  Patient presents with  . Cough    HPI Lindsey Cruz is a 52 y.o. female. Had the onset of cough on 6/20 and malaise/achiness on 6/21.  Continued to cough, some headache starting 6/23.  Would like to be well before trip out of town on 6/28.  No fever.  Some runny/congested nose.  No GI upset.      HPI  Past Medical History:  Diagnosis Date  . Diabetes mellitus without complication (HCC)   . Hypercholesteremia   . Hypertension     There are no active problems to display for this patient.   Past Surgical History:  Procedure Laterality Date  . BREAST CYST ASPIRATION Right 2014   neg  . BREAST EXCISIONAL BIOPSY Right 1983   neg  . TUBAL LIGATION      OB History   None      Home Medications    Prior to Admission medications   Medication Sig Start Date End Date Taking? Authorizing Provider  atorvastatin (LIPITOR) 20 MG tablet TAKE 1 TABLET BY MOUTH  EVERY EVENING FOR  CHOLESTEROL 11/30/17  Yes Boscia, Heather E, NP  bisoprolol-hydrochlorothiazide (ZIAC) 2.5-6.25 MG tablet Take 1 tablet by mouth daily. 10/25/17  Yes Lyndon CodeKhan, Fozia M, MD  Dulaglutide (TRULICITY) 0.75 MG/0.5ML SOPN Inject 0.75 mg into the skin once a week. 10/25/17  Yes Lyndon CodeKhan, Fozia M, MD  fexofenadine-pseudoephedrine (ALLEGRA-D ALLERGY & CONGESTION) 180-240 MG 24 hr tablet Take 1 tablet by mouth daily. 01/16/16  Yes Hassan RowanWade, Eugene, MD  sodium chloride (OCEAN) 0.65 % SOLN nasal spray Place 2 sprays into both nostrils every 2 (two) hours while awake. 06/30/15  Yes Betancourt, Jarold Songina A, NP  Vitamin D, Ergocalciferol, (DRISDOL) 50000 units CAPS capsule  06/04/15  Yes [provider]  atorvastatin (LIPITOR) 10 MG tablet Take 1 tablet (10 mg total) by mouth daily. 09/26/17   Lyndon CodeKhan, Fozia M, MD  benzonatate (TESSALON) 200 MG capsule Take 1 capsule (200 mg total) by mouth 3 (three) times  daily as needed for cough. 12/03/17   Isa RankinMurray, Walterine Amodei Wilson, MD  fluticasone [facility](FLONASE) 50 MCG/ACT nasal spray Place 1 spray into both nostrils 2 (two) times daily. 06/30/15 07/31/15  Betancourt, Jarold Songina A, NP  hydrochlorothiazide (MICROZIDE) 12.5 MG capsule Take 1 capsule (12.5 mg total) by mouth daily. 10/25/17   Lyndon CodeKhan, Fozia M, MD  ibuprofen (IBU) 600 MG tablet Take by mouth. 12/25/16   [provider]  meloxicam (MOBIC) 15 MG tablet Take by mouth.    [provider]  omeprazole (PRILOSEC) 20 MG capsule Take 1 capsule (20 mg total) by mouth daily. 09/26/17   Lyndon CodeKhan, Fozia M, MD    Family History Family History  Problem Relation Age of Onset  . Hypertension Mother   . Hyperlipidemia Mother   . Diabetes Mother   . Diabetes Maternal Grandmother     Social History Social History   Tobacco Use  . Smoking status: Never Smoker  . Smokeless tobacco: Never Used  Substance Use Topics  . Alcohol use: Yes    Comment: occasional  . Drug use: Never     Allergies   Patient has no known allergies.   Review of Systems Review of Systems  All other systems reviewed and are negative.    Physical Exam Triage Vital Signs ED Triage Vitals  Enc Vitals Group  BP 12/03/17 1914 112/76     Pulse Rate 12/03/17 1914 84     Resp 12/03/17 1914 18     Temp 12/03/17 1914 99 F (37.2 C)     Temp Source 12/03/17 1914 Oral     SpO2 12/03/17 1914 98 %     Weight --      Height --      Pain Score 12/03/17 1913 0     Pain Loc --    Updated Vital Signs BP 112/76 (BP Location: Left Arm)   Pulse 84   Temp 99 F (37.2 C) (Oral)   Resp 18   LMP 11/19/2017 (Exact Date)   SpO2 98%  Physical Exam  Constitutional: She is oriented to person, place, and time. No distress.  Alert, nicely groomed  HENT:  Head: Atraumatic.  B TMs mod dull, no erythema Mild-mod nasal congestion bilat Throat slightly injected  Eyes:  Conjugate gaze, no eye redness/drainage  Neck: Neck supple.    Cardiovascular: Normal rate and regular rhythm.  Pulmonary/Chest: No respiratory distress. She has no wheezes. She has no rales.  Lungs clear, symmetric breath sounds  Abdominal: She exhibits no distension.  Musculoskeletal: Normal range of motion.  No leg swelling  Neurological: She is alert and oriented to person, place, and time.  Skin: Skin is warm and dry.  No cyanosis  Nursing note and vitals reviewed.    UC Treatments / Results  Labs (all labs ordered are listed, but only abnormal results are displayed) Labs Reviewed - No data to display  EKG None  Radiology No results found.  Procedures Procedures (including critical care time)  Medications Ordered in UC Medications - No data to display  Final Clinical Impressions(s) / UC Diagnoses   Final diagnoses:  Upper respiratory tract infection, unspecified type     Discharge Instructions     No danger signs on exam today.  Symptoms of cough, achiness, headache, seem most likely to be due to a summer virus.  Anticipate gradual improvement in cough and well being over the next several days.  Ibuprofen as needed for achiness.  Prescription for tessalon (benzonatate, for cough) was sent to the pharmacy; coricidin is also a good choice.  Heat for 5-10 minutes several times daily may help decrease abdominal wall pain from coughing.  Ice is ok too, see what feels the best.   ED Prescriptions    Medication Sig Dispense Auth. Provider   benzonatate (TESSALON) 200 MG capsule Take 1 capsule (200 mg total) by mouth 3 (three) times daily as needed for cough. 30 capsule Isa Rankin, MD       Isa Rankin, MD 12/04/17 1350

## 2017-12-20 ENCOUNTER — Ambulatory Visit: Payer: BLUE CROSS/BLUE SHIELD | Admitting: Adult Health

## 2017-12-20 ENCOUNTER — Encounter: Payer: Self-pay | Admitting: Adult Health

## 2017-12-20 VITALS — BP 136/99 | HR 85 | Temp 98.3°F | Resp 16 | Ht 66.0 in | Wt 248.0 lb

## 2017-12-20 DIAGNOSIS — J019 Acute sinusitis, unspecified: Secondary | ICD-10-CM

## 2017-12-20 DIAGNOSIS — J302 Other seasonal allergic rhinitis: Secondary | ICD-10-CM | POA: Diagnosis not present

## 2017-12-20 NOTE — Progress Notes (Signed)
[facility] [address] [place], [place] [address]  Internal MEDICINE  Office Visit Note  Patient Name: Lindsey Cruz  [id number]  [id number]  Date of Service: 12/24/2017  Chief Complaint  Patient presents with  . Nasal Congestion   HPI  Pt here reporting head/ears/sinus congestion.  Pt has recently completed steroid burst pack for cough. She denies antibiotics recently.  She reports the cough is better, and now her head is full. She reports all over headache/ pressure.    Current Medication: Outpatient Encounter Medications as of 12/20/2017  Medication Sig  . atorvastatin (LIPITOR) 10 MG tablet Take 1 tablet (10 mg total) by mouth daily.  Marland Kitchen atorvastatin (LIPITOR) 20 MG tablet TAKE 1 TABLET BY MOUTH  EVERY EVENING FOR  CHOLESTEROL  . benzonatate (TESSALON) 200 MG capsule Take 1 capsule (200 mg total) by mouth 3 (three) times daily as needed for cough.  . bisoprolol-hydrochlorothiazide (ZIAC) 2.5-6.25 MG tablet Take 1 tablet by mouth daily.  . Dulaglutide (TRULICITY) 0.75 MG/0.5ML SOPN Inject 0.75 mg into the skin once a week.  . fexofenadine-pseudoephedrine (ALLEGRA-D ALLERGY & CONGESTION) 180-240 MG 24 hr tablet Take 1 tablet by mouth daily.  . hydrochlorothiazide (MICROZIDE) 12.5 MG capsule Take 1 capsule (12.5 mg total) by mouth daily.  Marland Kitchen ibuprofen (IBU) 600 MG tablet Take by mouth.  . meloxicam (MOBIC) 15 MG tablet Take by mouth.  Marland Kitchen omeprazole (PRILOSEC) 20 MG capsule Take 1 capsule (20 mg total) by mouth daily.  . sodium chloride (OCEAN) 0.65 % SOLN nasal spray Place 2 sprays into both nostrils every 2 (two) hours while awake.  . Vitamin D, Ergocalciferol, (DRISDOL) 50000 units CAPS capsule   . fluticasone (FLONASE) 50 MCG/ACT nasal spray Place 1 spray into both nostrils 2 (two) times daily.   No facility-administered encounter medications on file as of 12/20/2017.     Surgical History: Past Surgical History:  Procedure Laterality Date  . BREAST CYST  ASPIRATION Right 2014   neg  . BREAST EXCISIONAL BIOPSY Right 1983   neg  . TUBAL LIGATION      Medical History: Past Medical History:  Diagnosis Date  . Diabetes mellitus without complication (HCC)   . Hypercholesteremia   . Hypertension     Family History: Family History  Problem Relation Age of Onset  . Hypertension Mother   . Hyperlipidemia Mother   . Diabetes Mother   . Diabetes Maternal Grandmother     Social History   Socioeconomic History  . Marital status: Married    Spouse name: Not on file  . Number of children: Not on file  . Years of education: Not on file  . Highest education level: Not on file  Occupational History  . Not on file  Social Needs  . Financial resource strain: Not on file  . Food insecurity:    Worry: Not on file    Inability: Not on file  . Transportation needs:    Medical: Not on file    Non-medical: Not on file  Tobacco Use  . Smoking status: Never Smoker  . Smokeless tobacco: Never Used  Substance and Sexual Activity  . Alcohol use: Yes    Comment: occasional  . Drug use: Never  . Sexual activity: Not on file  Lifestyle  . Physical activity:    Days per week: Not on file    Minutes per session: Not on file  . Stress: Not on file  Relationships  . Social connections:  Talks on phone: Not on file    Gets together: Not on file    Attends religious service: Not on file    Active member of club or organization: Not on file    Attends meetings of clubs or organizations: Not on file    Relationship status: Not on file  . Intimate partner violence:    Fear of current or ex partner: Not on file    Emotionally abused: Not on file    Physically abused: Not on file    Forced sexual activity: Not on file  Other Topics Concern  . Not on file  Social History Narrative  . Not on file      Review of Systems  Constitutional: Negative.   HENT: Positive for congestion, postnasal drip and sinus pressure.   Eyes: Positive for  redness.  Respiratory: Negative.   Cardiovascular: Negative.   Gastrointestinal: Negative.   Endocrine: Negative.   Genitourinary: Negative.   Musculoskeletal: Negative.   Skin: Negative.   Allergic/Immunologic: Positive for environmental allergies.  Neurological: Negative.   Hematological: Negative.   Psychiatric/Behavioral: Negative.    Vital Signs: BP (!) 136/99   Pulse 85   Temp 98.3 F (36.8 C)   Resp 16   Ht 5\' 6"  (1.676 m)   Wt 248 lb (112.5 kg)   SpO2 96%   BMI 40.03 kg/m   Physical Exam  Constitutional: She is oriented to person, place, and time. She appears well-developed and well-nourished.  HENT:  Head: Normocephalic.  Eyes: Pupils are equal, round, and reactive to light.  Neck: Normal range of motion. Neck supple.  Cardiovascular: Normal rate.  Pulmonary/Chest: Effort normal and breath sounds normal.  Musculoskeletal: Normal range of motion.  Neurological: She is alert and oriented to person, place, and time.  Skin: Skin is warm and dry.  Psychiatric: She has a normal mood and affect.  Nursing note and vitals reviewed.  Assessment/Plan: 1. Seasonal allergies Take RX Singulair 10mg  po daily for allergies. RX Flonase 2 sprays each nostril twice daily for 14 days. If symptoms continue might need allergy testting   2. Acute non-recurrent sinusitis, unspecified location Discussed antibiotics with patient. No signs of infection noted on exam.  Agreed to try medications and OTC decongestant for a few days.  Will send a message or call for antibiotics if signs of infection become present.    Monitor blood pressure since it is elevated today   General Counseling: Lindsey Cruz verbalizes understanding of the findings of todays visit and agrees with plan of treatment. I have discussed any further diagnostic evaluation that may be needed or ordered today. We also reviewed her medications today. she has been encouraged to call the office with any questions or concerns that  should arise related to todays visit. Pt was seen By Blima LedgerAdam Kairy Folsom FNP  Time spent:25 Minutes  Dr Lyndon CodeFozia M Khan Internal medicine

## 2017-12-26 ENCOUNTER — Encounter: Payer: Self-pay | Admitting: Adult Health

## 2017-12-26 ENCOUNTER — Other Ambulatory Visit: Payer: Self-pay | Admitting: Adult Health

## 2017-12-26 MED ORDER — AMOXICILLIN-POT CLAVULANATE 875-125 MG PO TABS: 1.0000 | ORAL_TABLET | Freq: Two times a day (BID) | ORAL | 0 refills | Status: AC

## 2017-12-26 NOTE — Telephone Encounter (Signed)
Pt called still having bad cough and congestion as per adam send augmentin

## 2017-12-27 ENCOUNTER — Ambulatory Visit: Payer: Self-pay | Admitting: Nurse Practitioner

## 2018-02-23 DIAGNOSIS — R002 Palpitations: Secondary | ICD-10-CM | POA: Diagnosis not present

## 2018-02-23 DIAGNOSIS — M79601 Pain in right arm: Secondary | ICD-10-CM | POA: Diagnosis not present

## 2018-02-27 ENCOUNTER — Ambulatory Visit: Payer: BLUE CROSS/BLUE SHIELD | Admitting: Adult Health

## 2018-02-27 ENCOUNTER — Encounter: Payer: Self-pay | Admitting: Adult Health

## 2018-02-27 VITALS — BP 125/78 | HR 73 | Temp 97.8°F | Resp 16 | Ht 66.0 in | Wt 249.0 lb

## 2018-02-27 DIAGNOSIS — I10 Essential (primary) hypertension: Secondary | ICD-10-CM | POA: Diagnosis not present

## 2018-02-27 DIAGNOSIS — R5383 Other fatigue: Secondary | ICD-10-CM

## 2018-02-27 DIAGNOSIS — E119 Type 2 diabetes mellitus without complications: Secondary | ICD-10-CM

## 2018-02-27 NOTE — Progress Notes (Signed)
[facility] [address] [place], [place] [address]  Internal MEDICINE  Office Visit Note  Patient Name: Lindsey Cruz  [id number]  [id number]  Date of Service: 03/05/2018  Chief Complaint  Patient presents with  . Hyperlipidemia  . Hypertension  . Diabetes  . Fatigue  . Chills  . Hot Flashes     HPI Pt is here for a sick visit. Last weekend, she reports feeling fatigued,and lightheaded. She went to urgent care and they did and EKG, which was normal.  No prescriptions were provided to her. She is also reporting intermittent Hot flashes.  She feels like it could be menopause.  She has a family history of anemia.  She works for [employer], and would like her labs checked for this.         Current Medication:  Outpatient Encounter Medications as of 02/27/2018  Medication Sig  . atorvastatin (LIPITOR) 10 MG tablet Take 1 tablet (10 mg total) by mouth daily.  Marland Kitchen. atorvastatin (LIPITOR) 20 MG tablet TAKE 1 TABLET BY MOUTH  EVERY EVENING FOR  CHOLESTEROL  . benzonatate (TESSALON) 200 MG capsule Take 1 capsule (200 mg total) by mouth 3 (three) times daily as needed for cough.  . bisoprolol-hydrochlorothiazide (ZIAC) 2.5-6.25 MG tablet Take 1 tablet by mouth daily.  . Dulaglutide (TRULICITY) 0.75 MG/0.5ML SOPN Inject 0.75 mg into the skin once a week.  . fexofenadine-pseudoephedrine (ALLEGRA-D ALLERGY & CONGESTION) 180-240 MG 24 hr tablet Take 1 tablet by mouth daily.  . hydrochlorothiazide (MICROZIDE) 12.5 MG capsule Take 1 capsule (12.5 mg total) by mouth daily.  Marland Kitchen. ibuprofen (IBU) 600 MG tablet Take by mouth.  . meloxicam (MOBIC) 15 MG tablet Take by mouth.  Marland Kitchen. omeprazole (PRILOSEC) 20 MG capsule Take 1 capsule (20 mg total) by mouth daily.  . sodium chloride (OCEAN) 0.65 % SOLN nasal spray Place 2 sprays into both nostrils every 2 (two) hours while awake.  . [DISCONTINUED] Vitamin D, Ergocalciferol, (DRISDOL) 50000 units CAPS capsule   . fluticasone (FLONASE) 50 MCG/ACT  nasal spray Place 1 spray into both nostrils 2 (two) times daily.   No facility-administered encounter medications on file as of 02/27/2018.       Medical History: Past Medical History:  Diagnosis Date  . Diabetes mellitus without complication (HCC)   . Hypercholesteremia   . Hypertension      Vital Signs: BP 125/78   Pulse 73   Temp 97.8 F (36.6 C)   Resp 16   Ht 5\' 6"  (1.676 m)   Wt 249 lb (112.9 kg)   SpO2 95%   BMI 40.19 kg/m    Review of Systems  Constitutional: Negative for chills, fatigue and unexpected weight change.  HENT: Negative for congestion, rhinorrhea, sneezing and sore throat.   Eyes: Negative for photophobia, pain and redness.  Respiratory: Negative for cough, chest tightness and shortness of breath.   Cardiovascular: Negative for chest pain and palpitations.  Gastrointestinal: Negative for abdominal pain, constipation, diarrhea, nausea and vomiting.  Endocrine: Negative.   Genitourinary: Negative for dysuria and frequency.  Musculoskeletal: Negative for arthralgias, back pain, joint swelling and neck pain.  Skin: Negative for rash.  Allergic/Immunologic: Negative.   Neurological: Negative for tremors and numbness.  Hematological: Negative for adenopathy. Does not bruise/bleed easily.  Psychiatric/Behavioral: Negative for behavioral problems and sleep disturbance. The patient is not nervous/anxious.     Physical Exam  Constitutional: She is oriented to person, place, and time. She appears well-developed and well-nourished. No distress.  HENT:  Head: Normocephalic and atraumatic.  Mouth/Throat: Oropharynx is clear and moist. No oropharyngeal exudate.  Eyes: Pupils are equal, round, and reactive to light. EOM are normal.  Neck: Normal range of motion. Neck supple. No JVD present. No tracheal deviation present. No thyromegaly present.  Cardiovascular: Normal rate, regular rhythm and normal heart sounds. Exam reveals no gallop and no friction rub.   No murmur heard. Pulmonary/Chest: Effort normal and breath sounds normal. No respiratory distress. She has no wheezes. She has no rales. She exhibits no tenderness.  Abdominal: Soft. There is no tenderness. There is no guarding.  Musculoskeletal: Normal range of motion.  Lymphadenopathy:    She has no cervical adenopathy.  Neurological: She is alert and oriented to person, place, and time. No cranial nerve deficit.  Skin: Skin is warm and dry. She is not diaphoretic.  Psychiatric: She has a normal mood and affect. Her behavior is normal. Judgment and thought content normal.  Nursing note and vitals reviewed.   Assessment/Plan: 1. Fatigue, unspecified type Fatigue lab workup, will follow up with results.  - Comprehensive metabolic panel - Magnesium - B12 and Folate Panel - Fe+TIBC+Fer - Vitamin D (25 hydroxy) - CBC w/Diff/Platelet  2. Essential hypertension Stable, continue to take medications as directed.   3. Diabetes mellitus without complication (HCC) Controlled, continue current medications.   General Counseling: aubrea meixner understanding of the findings of todays visit and agrees with plan of treatment. I have discussed any further diagnostic evaluation that may be needed or ordered today. We also reviewed her medications today. she has been encouraged to call the office with any questions or concerns that should arise related to todays visit.   Orders Placed This Encounter  Procedures  . Comprehensive metabolic panel  . Magnesium  . B12 and Folate Panel  . Fe+TIBC+Fer  . Vitamin D (25 hydroxy)  . CBC w/Diff/Platelet    No orders of the defined types were placed in this encounter.   Time spent: 25 Minutes  This patient was seen by Blima Ledger AGNP-C in Collaboration with Dr Lyndon Code as a part of collaborative care agreement

## 2018-02-28 LAB — COMPREHENSIVE METABOLIC PANEL
ALBUMIN: 4.6 g/dL (ref 3.5–5.5)
ALT: 12 IU/L (ref 0–32)
AST: 15 IU/L (ref 0–40)
Albumin/Globulin Ratio: 1.6 (ref 1.2–2.2)
Alkaline Phosphatase: 58 IU/L (ref 39–117)
BUN / CREAT RATIO: 15 (ref 9–23)
BUN: 14 mg/dL (ref 6–24)
Bilirubin Total: 0.3 mg/dL (ref 0.0–1.2)
CALCIUM: 9.8 mg/dL (ref 8.7–10.2)
CO2: 27 mmol/L (ref 20–29)
CREATININE: 0.91 mg/dL (ref 0.57–1.00)
Chloride: 98 mmol/L (ref 96–106)
GFR, EST AFRICAN AMERICAN: 85 mL/min/{1.73_m2} (ref >59)
GFR, EST NON AFRICAN AMERICAN: 74 mL/min/{1.73_m2} (ref >59)
GLUCOSE: 85 mg/dL (ref 65–99)
Globulin, Total: 2.9 g/dL (ref 1.5–4.5)
Potassium: 4.3 mmol/L (ref 3.5–5.2)
Sodium: 136 mmol/L (ref 134–144)
Total Protein: 7.5 g/dL (ref 6.0–8.5)

## 2018-02-28 LAB — MAGNESIUM: Magnesium: 2 mg/dL (ref 1.6–2.3)

## 2018-02-28 LAB — IRON,TIBC AND FERRITIN PANEL
FERRITIN: 300 ng/mL — AB (ref 15–150)
IRON SATURATION: 15 % (ref 15–55)
IRON: 53 ug/dL (ref 27–159)
TIBC: 345 ug/dL (ref 250–450)
UIBC: 292 ug/dL (ref 131–425)

## 2018-02-28 LAB — B12 AND FOLATE PANEL
FOLATE: 18.3 ng/mL (ref >3.0)
Vitamin B-12: 513 pg/mL (ref 232–1245)

## 2018-02-28 LAB — VITAMIN D 25 HYDROXY (VIT D DEFICIENCY, FRACTURES): Vit D, 25-Hydroxy: 18.6 ng/mL — ABNORMAL LOW (ref 30.0–100.0)

## 2018-03-01 ENCOUNTER — Telehealth: Payer: Self-pay

## 2018-03-01 ENCOUNTER — Other Ambulatory Visit: Payer: Self-pay

## 2018-03-01 MED ORDER — VITAMIN D (ERGOCALCIFEROL) 1.25 MG (50000 UNIT) PO CAPS: ORAL_CAPSULE | ORAL | 1 refills | Status: DC

## 2018-03-01 NOTE — Telephone Encounter (Signed)
Tell her Her Iron level is elevated.  And her Vit D level is low.  I am sending a once a week vitamin D supplement, that she will take for 4 months, and then she can take and over the counter Vit D supplement after that.  We will recheck it at her next physical.   As far as iron, make sure she isn't taking an iron supplement, and also that she does not eat a lot of red meat or organ meat like kidney, liver and such.  The rest of the labs look good, i'm still waiting for two things, but I anticipate they will be normal.

## 2018-03-04 ENCOUNTER — Telehealth: Payer: Self-pay

## 2018-03-04 NOTE — Telephone Encounter (Signed)
lmom several message to call us back for refills

## 2018-03-04 NOTE — Telephone Encounter (Signed)
Pt advised for labs see note

## 2018-03-13 ENCOUNTER — Ambulatory Visit: Payer: Self-pay | Admitting: Adult Health

## 2018-03-13 ENCOUNTER — Ambulatory Visit: Payer: BLUE CROSS/BLUE SHIELD | Admitting: Adult Health

## 2018-03-13 ENCOUNTER — Encounter: Payer: Self-pay | Admitting: Adult Health

## 2018-03-13 VITALS — BP 124/72 | HR 77 | Resp 16 | Ht 66.0 in | Wt 250.4 lb

## 2018-03-13 DIAGNOSIS — E1165 Type 2 diabetes mellitus with hyperglycemia: Secondary | ICD-10-CM | POA: Diagnosis not present

## 2018-03-13 DIAGNOSIS — R5383 Other fatigue: Secondary | ICD-10-CM

## 2018-03-13 DIAGNOSIS — I10 Essential (primary) hypertension: Secondary | ICD-10-CM | POA: Diagnosis not present

## 2018-03-13 NOTE — Progress Notes (Addendum)
[facility] [address] [place], [place] [address]  Internal MEDICINE  Office Visit Note  Patient Name: Lindsey Cruz  [id number]  [id number]  Date of Service: 03/18/2018  Chief Complaint  Patient presents with  . Labs Only     2wk follow up lab results    HPI Pt here for follow up on labs.  Her lab works shows Vit D deficiently. She has started taking her Vit D supplement weekly.  She is generally doing well. Denies further needs at this time.  We discussed her Iron level, and we will redraw it in a few months.   Current Medication: Outpatient Encounter Medications as of 03/13/2018  Medication Sig  . atorvastatin (LIPITOR) 10 MG tablet Take 1 tablet (10 mg total) by mouth daily.  Marland Kitchen atorvastatin (LIPITOR) 20 MG tablet TAKE 1 TABLET BY MOUTH  EVERY EVENING FOR  CHOLESTEROL  . benzonatate (TESSALON) 200 MG capsule Take 1 capsule (200 mg total) by mouth 3 (three) times daily as needed for cough.  . bisoprolol-hydrochlorothiazide (ZIAC) 2.5-6.25 MG tablet Take 1 tablet by mouth daily.  . Dulaglutide (TRULICITY) 0.75 MG/0.5ML SOPN Inject 0.75 mg into the skin once a week.  . fexofenadine-pseudoephedrine (ALLEGRA-D ALLERGY & CONGESTION) 180-240 MG 24 hr tablet Take 1 tablet by mouth daily.  . hydrochlorothiazide (MICROZIDE) 12.5 MG capsule Take 1 capsule (12.5 mg total) by mouth daily.  Marland Kitchen ibuprofen (IBU) 600 MG tablet Take by mouth.  . meloxicam (MOBIC) 15 MG tablet Take by mouth.  Marland Kitchen omeprazole (PRILOSEC) 20 MG capsule Take 1 capsule (20 mg total) by mouth daily.  . sodium chloride (OCEAN) 0.65 % SOLN nasal spray Place 2 sprays into both nostrils every 2 (two) hours while awake.  . Vitamin D, Ergocalciferol, (DRISDOL) 50000 units CAPS capsule Take 1 tab po once a week  . fluticasone (FLONASE) 50 MCG/ACT nasal spray Place 1 spray into both nostrils 2 (two) times daily.   No facility-administered encounter medications on file as of 03/13/2018.     Surgical History: Past  Surgical History:  Procedure Laterality Date  . BREAST CYST ASPIRATION Right 2014   neg  . BREAST EXCISIONAL BIOPSY Right 1983   neg  . TUBAL LIGATION      Medical History: Past Medical History:  Diagnosis Date  . Diabetes mellitus without complication (HCC)   . Hypercholesteremia   . Hypertension     Family History: Family History  Problem Relation Age of Onset  . Hypertension Mother   . Hyperlipidemia Mother   . Diabetes Mother   . Diabetes Maternal Grandmother     Social History   Socioeconomic History  . Marital status: Married    Spouse name: Not on file  . Number of children: Not on file  . Years of education: Not on file  . Highest education level: Not on file  Occupational History  . Not on file  Social Needs  . Financial resource strain: Not on file  . Food insecurity:    Worry: Not on file    Inability: Not on file  . Transportation needs:    Medical: Not on file    Non-medical: Not on file  Tobacco Use  . Smoking status: Never Smoker  . Smokeless tobacco: Never Used  Substance and Sexual Activity  . Alcohol use: Yes    Comment: occasional  . Drug use: Never  . Sexual activity: Not on file  Lifestyle  . Physical activity:    Days per week:  Not on file    Minutes per session: Not on file  . Stress: Not on file  Relationships  . Social connections:    Talks on phone: Not on file    Gets together: Not on file    Attends religious service: Not on file    Active member of club or organization: Not on file    Attends meetings of clubs or organizations: Not on file    Relationship status: Not on file  . Intimate partner violence:    Fear of current or ex partner: Not on file    Emotionally abused: Not on file    Physically abused: Not on file    Forced sexual activity: Not on file  Other Topics Concern  . Not on file  Social History Narrative  . Not on file      Review of Systems  Constitutional: Negative for chills, fatigue and  unexpected weight change.  HENT: Negative for congestion, rhinorrhea, sneezing and sore throat.   Eyes: Negative for photophobia, pain and redness.  Respiratory: Negative for cough, chest tightness and shortness of breath.   Cardiovascular: Negative for chest pain and palpitations.  Gastrointestinal: Negative for abdominal pain, constipation, diarrhea, nausea and vomiting.  Endocrine: Negative.   Genitourinary: Negative for dysuria and frequency.  Musculoskeletal: Negative for arthralgias, back pain, joint swelling and neck pain.  Skin: Negative for rash.  Allergic/Immunologic: Negative.   Neurological: Negative for tremors and numbness.  Hematological: Negative for adenopathy. Does not bruise/bleed easily.  Psychiatric/Behavioral: Negative for behavioral problems and sleep disturbance. The patient is not nervous/anxious.     Vital Signs: BP 124/72 (BP Location: Right Arm, Patient Position: Sitting, Cuff Size: Large)   Pulse 77   Resp 16   Ht 5\' 6"  (1.676 m)   Wt 250 lb 6.4 oz (113.6 kg)   SpO2 94%   BMI 40.42 kg/m    Physical Exam  Constitutional: She is oriented to person, place, and time. She appears well-developed and well-nourished. No distress.  HENT:  Head: Normocephalic and atraumatic.  Mouth/Throat: Oropharynx is clear and moist. No oropharyngeal exudate.  Eyes: Pupils are equal, round, and reactive to light. EOM are normal.  Neck: Normal range of motion. Neck supple. No JVD present. No tracheal deviation present. No thyromegaly present.  Cardiovascular: Normal rate, regular rhythm and normal heart sounds. Exam reveals no gallop and no friction rub.  No murmur heard. Pulmonary/Chest: Effort normal and breath sounds normal. No respiratory distress. She has no wheezes. She has no rales. She exhibits no tenderness.  Abdominal: Soft. There is no tenderness. There is no guarding.  Musculoskeletal: Normal range of motion.  Lymphadenopathy:    She has no cervical adenopathy.   Neurological: She is alert and oriented to person, place, and time. No cranial nerve deficit.  Skin: Skin is warm and dry. She is not diaphoretic.  Psychiatric: She has a normal mood and affect. Her behavior is normal. Judgment and thought content normal.  Nursing note and vitals reviewed.  Assessment/Plan: 1. Fatigue, unspecified type Appears to be resolving.  Will recheck labs after a few months with vit D supplementation.   2. Essential hypertension Controlled at this time. Continue current therapy.   3. Uncontrolled type 2 diabetes mellitus with hyperglycemia (HCC) Stable, continue current regimen.   General Counseling: gusta marksberry understanding of the findings of todays visit and agrees with plan of treatment. I have discussed any further diagnostic evaluation that may be needed or ordered today. We  also reviewed her medications today. she has been encouraged to call the office with any questions or concerns that should arise related to todays visit.   Time spent: 25 Minutes   This patient was seen by Blima Ledger AGNP-C in Collaboration with Dr Lyndon Code as a part of collaborative care agreement    Blima Ledger [facility] Internal medicine

## 2018-03-13 NOTE — Patient Instructions (Signed)

## 2018-03-22 ENCOUNTER — Encounter: Payer: Self-pay | Admitting: Nurse Practitioner

## 2018-03-22 NOTE — Progress Notes (Signed)
SCANNED IN SCREENING SUMMARY REPORTED ON 01/28/18.

## 2018-06-10 ENCOUNTER — Other Ambulatory Visit: Payer: Self-pay

## 2018-06-10 MED ORDER — ATORVASTATIN CALCIUM 20 MG PO TABS: 20.0000 mg | ORAL_TABLET | Freq: Every evening | ORAL | 1 refills | Status: DC

## 2018-07-18 ENCOUNTER — Ambulatory Visit: Payer: Self-pay | Admitting: Adult Health

## 2018-07-24 ENCOUNTER — Ambulatory Visit: Payer: Self-pay | Admitting: Adult Health

## 2018-07-29 ENCOUNTER — Encounter: Payer: Self-pay | Admitting: Adult Health

## 2018-07-29 ENCOUNTER — Other Ambulatory Visit: Payer: Self-pay

## 2018-07-29 ENCOUNTER — Ambulatory Visit: Payer: BLUE CROSS/BLUE SHIELD | Admitting: Adult Health

## 2018-07-29 VITALS — BP 142/101 | HR 61 | Resp 16 | Ht 66.0 in | Wt 255.0 lb

## 2018-07-29 DIAGNOSIS — E119 Type 2 diabetes mellitus without complications: Secondary | ICD-10-CM | POA: Diagnosis not present

## 2018-07-29 DIAGNOSIS — I10 Essential (primary) hypertension: Secondary | ICD-10-CM | POA: Diagnosis not present

## 2018-07-29 DIAGNOSIS — Z1211 Encounter for screening for malignant neoplasm of colon: Secondary | ICD-10-CM

## 2018-07-29 DIAGNOSIS — E559 Vitamin D deficiency, unspecified: Secondary | ICD-10-CM | POA: Diagnosis not present

## 2018-07-29 DIAGNOSIS — E66813 Obesity, class 3: Secondary | ICD-10-CM

## 2018-07-29 DIAGNOSIS — E1165 Type 2 diabetes mellitus with hyperglycemia: Secondary | ICD-10-CM

## 2018-07-29 DIAGNOSIS — Z6841 Body Mass Index (BMI) 40.0 and over, adult: Secondary | ICD-10-CM

## 2018-07-29 DIAGNOSIS — E611 Iron deficiency: Secondary | ICD-10-CM | POA: Diagnosis not present

## 2018-07-29 DIAGNOSIS — R5383 Other fatigue: Secondary | ICD-10-CM

## 2018-07-29 LAB — POCT GLYCOSYLATED HEMOGLOBIN (HGB A1C): Hemoglobin A1C: 6.9 % — AB (ref 4.0–5.6)

## 2018-07-29 NOTE — Patient Instructions (Signed)

## 2018-07-29 NOTE — Progress Notes (Signed)
[facility] [address] [place], [place] [address]  Internal MEDICINE  Office Visit Note  Patient Name: Lindsey Cruz  [id number]  [id number]  Date of Service: 07/29/2018  Chief Complaint  Patient presents with  . Fatigue    since vit d it is better   . Hypertension    bp elevated   . Diabetes  . Quality Metric Gaps    colonoscopy, foot exam     HPI   Patient is here for follow-up on her fatigue, hypertension, and diabetes.  Patient was her fatigue has improved since beginning her vitamin D supplement.  She would like a vitamin D level checked to see where they are at this time.  She denies any issues with her blood pressure however it was found to be high today at 142/101 initially.  We will recheck blood pressure at the end of visit.  Patient denies any headache, chest pain, shortness of breath or palpitations.  She reports that her diabetes has been doing well and she denies any issues with such at this time.  She is in need of a foot exam as well as colonoscopy to close her quality metric gaps.  Refer patient to podiatry at her request and to GI for colonoscopy.   Current Medication: Outpatient Encounter Medications as of 07/29/2018  Medication Sig  . atorvastatin (LIPITOR) 20 MG tablet Take 1 tablet (20 mg total) by mouth every evening. for cholesterol  . bisoprolol-hydrochlorothiazide (ZIAC) 2.5-6.25 MG tablet Take 1 tablet by mouth daily.  . cholecalciferol (VITAMIN D3) 25 MCG (1000 UT) tablet Take 1,000 Units by mouth daily.  . Dulaglutide (TRULICITY) 0.75 MG/0.5ML SOPN Inject 0.75 mg into the skin once a week.  . fexofenadine-pseudoephedrine (ALLEGRA-D ALLERGY & CONGESTION) 180-240 MG 24 hr tablet Take 1 tablet by mouth daily.  . hydrochlorothiazide (MICROZIDE) 12.5 MG capsule Take 1 capsule (12.5 mg total) by mouth daily.  Marland Kitchen. ibuprofen (IBU) 600 MG tablet Take by mouth.  Marland Kitchen. omeprazole (PRILOSEC) 20 MG capsule Take 1 capsule (20 mg total) by mouth daily.  .  sodium chloride (OCEAN) 0.65 % SOLN nasal spray Place 2 sprays into both nostrils every 2 (two) hours while awake.  . fluticasone (FLONASE) 50 MCG/ACT nasal spray Place 1 spray into both nostrils 2 (two) times daily.  . [DISCONTINUED] benzonatate (TESSALON) 200 MG capsule Take 1 capsule (200 mg total) by mouth 3 (three) times daily as needed for cough. (Patient not taking: Reported on 07/29/2018)  . [DISCONTINUED] meloxicam (MOBIC) 15 MG tablet Take by mouth.  . [DISCONTINUED] Vitamin D, Ergocalciferol, (DRISDOL) 50000 units CAPS capsule Take 1 tab po once a week (Patient not taking: Reported on 07/29/2018)   No facility-administered encounter medications on file as of 07/29/2018.     Surgical History: Past Surgical History:  Procedure Laterality Date  . BREAST CYST ASPIRATION Right 2014   neg  . BREAST EXCISIONAL BIOPSY Right 1983   neg  . TUBAL LIGATION      Medical History: Past Medical History:  Diagnosis Date  . Diabetes mellitus without complication (HCC)   . Hypercholesteremia   . Hypertension     Family History: Family History  Problem Relation Age of Onset  . Hypertension Mother   . Hyperlipidemia Mother   . Diabetes Mother   . Diabetes Maternal Grandmother     Social History   Socioeconomic History  . Marital status: Married    Spouse name: Not on file  . Number of children: Not  on file  . Years of education: Not on file  . Highest education level: Not on file  Occupational History  . Not on file  Social Needs  . Financial resource strain: Not on file  . Food insecurity:    Worry: Not on file    Inability: Not on file  . Transportation needs:    Medical: Not on file    Non-medical: Not on file  Tobacco Use  . Smoking status: Never Smoker  . Smokeless tobacco: Never Used  Substance and Sexual Activity  . Alcohol use: Yes    Comment: occasional  . Drug use: Never  . Sexual activity: Not on file  Lifestyle  . Physical activity:    Days per week: Not  on file    Minutes per session: Not on file  . Stress: Not on file  Relationships  . Social connections:    Talks on phone: Not on file    Gets together: Not on file    Attends religious service: Not on file    Active member of club or organization: Not on file    Attends meetings of clubs or organizations: Not on file    Relationship status: Not on file  . Intimate partner violence:    Fear of current or ex partner: Not on file    Emotionally abused: Not on file    Physically abused: Not on file    Forced sexual activity: Not on file  Other Topics Concern  . Not on file  Social History Narrative  . Not on file      Review of Systems  Constitutional: Negative for chills, fatigue and unexpected weight change.  HENT: Negative for congestion, rhinorrhea, sneezing and sore throat.   Eyes: Negative for photophobia, pain and redness.  Respiratory: Negative for cough, chest tightness and shortness of breath.   Cardiovascular: Negative for chest pain and palpitations.  Gastrointestinal: Negative for abdominal pain, constipation, diarrhea, nausea and vomiting.  Endocrine: Negative.   Genitourinary: Negative for dysuria and frequency.  Musculoskeletal: Negative for arthralgias, back pain, joint swelling and neck pain.  Skin: Negative for rash.  Allergic/Immunologic: Negative.   Neurological: Negative for tremors and numbness.  Hematological: Negative for adenopathy. Does not bruise/bleed easily.  Psychiatric/Behavioral: Negative for behavioral problems and sleep disturbance. The patient is not nervous/anxious.     Vital Signs: BP (!) 142/101 (BP Location: Left Arm, Patient Position: Sitting, Cuff Size: Normal)   Pulse 61   Resp 16   Ht 5\' 6"  (1.676 m)   Wt 255 lb (115.7 kg)   SpO2 97%   BMI 41.16 kg/m    Physical Exam Vitals signs and nursing note reviewed.  Constitutional:      General: She is not in acute distress.    Appearance: She is well-developed. She is not  diaphoretic.  HENT:     Head: Normocephalic and atraumatic.     Mouth/Throat:     Pharynx: No oropharyngeal exudate.  Eyes:     Pupils: Pupils are equal, round, and reactive to light.  Neck:     Musculoskeletal: Normal range of motion and neck supple.     Thyroid: No thyromegaly.     Vascular: No JVD.     Trachea: No tracheal deviation.  Cardiovascular:     Rate and Rhythm: Normal rate and regular rhythm.     Heart sounds: Normal heart sounds. No murmur. No friction rub. No gallop.   Pulmonary:     Effort: Pulmonary  effort is normal. No respiratory distress.     Breath sounds: Normal breath sounds. No wheezing or rales.  Chest:     Chest wall: No tenderness.  Abdominal:     Palpations: Abdomen is soft.     Tenderness: There is no abdominal tenderness. There is no guarding.  Musculoskeletal: Normal range of motion.  Lymphadenopathy:     Cervical: No cervical adenopathy.  Skin:    General: Skin is warm and dry.  Neurological:     Mental Status: She is alert and oriented to person, place, and time.     Cranial Nerves: No cranial nerve deficit.  Psychiatric:        Behavior: Behavior normal.        Thought Content: Thought content normal.        Judgment: Judgment normal.        Assessment/Plan: 1. Diabetes mellitus without complication (HCC) Patient's hemoglobin A1c remains at 6.9 at this visit.  She appears to be doing very well however she has gained 5 pounds and she is concerned about this.  We had a discussion about diet and eliminating any carbohydrates as possible.  She verbalized understanding and reports she will work on that before next visit. - POCT HgB A1C - Ambulatory referral to Podiatry  2. Essential hypertension Patient's blood pressure elevated today.  However once rechecked in room for to be 128/92.  I have encouraged patient to pressure at home over the next few days and call me with results.  If she is consistently getting a diastolic blood pressure  greater than 90 we discussed increasing her Ziac or adding another medication.  3. Vitamin D insufficiency Patient will continue to take vitamin D supplement and will recheck Monday level before next visit.  4. Iron deficiency Patient TIBC was low at last blood draw.  Fatigue has improved with vitamin D supplementation so we will recheck iron levels at this time also.  5. Class 3 severe obesity due to excess calories with serious comorbidity and body mass index (BMI) of 40.0 to 44.9 in adult [facility]) Obesity Counseling: Risk Assessment: An assessment of behavioral risk factors was made today and includes lack of exercise sedentary lifestyle, lack of portion control and poor dietary habits.  Risk Modification Advice: She was counseled on portion control guidelines. Restricting daily caloric intake to. . The detrimental long term effects of obesity on her health and ongoing poor compliance was also discussed with the patient.  6. Fatigue, unspecified type Patient reports improved fatigue since darting about the supplement.  Will monitor vitamin D level.   7. Screen for colon cancer - Ambulatory referral to Gastroenterology-colonoscopy  General Counseling: Lynnea Maizes understanding of the findings of todays visit and agrees with plan of treatment. I have discussed any further diagnostic evaluation that may be needed or ordered today. We also reviewed her medications today. she has been encouraged to call the office with any questions or concerns that should arise related to todays visit.    Orders Placed This Encounter  Procedures  . Ambulatory referral to Podiatry  . Ambulatory referral to Gastroenterology  . POCT HgB A1C    No orders of the defined types were placed in this encounter.   Time spent: 25 Minutes   This patient was seen by Blima Ledger AGNP-C in Collaboration with Dr Lyndon Code as a part of collaborative care agreement     Johnna Acosta AGNP-C Internal  medicine

## 2018-07-30 DIAGNOSIS — M791 Myalgia, unspecified site: Secondary | ICD-10-CM | POA: Diagnosis not present

## 2018-07-30 DIAGNOSIS — R5383 Other fatigue: Secondary | ICD-10-CM | POA: Diagnosis not present

## 2018-07-30 DIAGNOSIS — R6889 Other general symptoms and signs: Secondary | ICD-10-CM | POA: Diagnosis not present

## 2018-08-02 ENCOUNTER — Telehealth: Payer: Self-pay | Admitting: Gastroenterology

## 2018-08-02 NOTE — Telephone Encounter (Signed)
Patient called & L/M on V/M to reschedule colonoscopy with Dr Maximino Greenland on 08-09-2018.

## 2018-08-05 NOTE — Telephone Encounter (Signed)
Returned patient's call.  Her colonoscopy has been rescheduled due to other obligations from 08/09/18 with Tahiliani at [facility] to 08/23/18 with Tahiliani at [facility].  Trish in Endoscopy has been informed.  Referral has been updated.  Thanks [employer]

## 2018-08-16 ENCOUNTER — Telehealth: Payer: Self-pay

## 2018-08-16 NOTE — Telephone Encounter (Signed)
PT CALLED SAYING THAT THERE IS A COLON CANCER SCREENING TEST THROUGH HER JOB AT [facility] AND WANTED TO KNOW IF SHE COULD DO THAT INSTEAD OF HER COLONOSCOPY.   TALKED WITH ADAM AND HE SAID THAT SHE CAN DO A COLOGUARD, BUT IF SHE PREFERRED THE SCREENING THROUGH [facility] SHE CAN DO THAT.  CALLED PT AND LET HER KNOW OF THE ABOVE. ADVISED PT TO HAVE THE RESULTS SENT TO OUR OFFICE SO WE HAVE IT IN HER RECORDS.

## 2018-08-23 ENCOUNTER — Encounter: Admission: RE | Payer: Self-pay | Source: Home / Self Care

## 2018-08-23 ENCOUNTER — Ambulatory Visit
Admission: RE | Admit: 2018-08-23 | Payer: BLUE CROSS/BLUE SHIELD | Source: Home / Self Care | Admitting: Gastroenterology

## 2018-08-23 SURGERY — COLONOSCOPY WITH PROPOFOL
Anesthesia: General

## 2018-09-18 ENCOUNTER — Ambulatory Visit: Payer: Self-pay | Admitting: Podiatry

## 2018-09-19 ENCOUNTER — Encounter: Payer: Self-pay | Admitting: Nurse Practitioner

## 2018-09-19 ENCOUNTER — Ambulatory Visit: Payer: BLUE CROSS/BLUE SHIELD | Admitting: Nurse Practitioner

## 2018-09-19 ENCOUNTER — Other Ambulatory Visit: Payer: Self-pay

## 2018-09-19 ENCOUNTER — Other Ambulatory Visit: Payer: Self-pay | Admitting: Internal Medicine

## 2018-09-19 VITALS — BP 120/80 | HR 81 | Ht 66.0 in | Wt 255.0 lb

## 2018-09-19 DIAGNOSIS — B3731 Acute candidiasis of vulva and vagina: Secondary | ICD-10-CM

## 2018-09-19 DIAGNOSIS — B373 Candidiasis of vulva and vagina: Secondary | ICD-10-CM | POA: Diagnosis not present

## 2018-09-19 MED ORDER — NYSTATIN 100000 UNIT/GM EX OINT: 1.0000 "application " | TOPICAL_OINTMENT | Freq: Three times a day (TID) | CUTANEOUS | 1 refills | Status: DC

## 2018-09-19 MED ORDER — FLUCONAZOLE 150 MG PO TABS: ORAL_TABLET | ORAL | 0 refills | Status: DC

## 2018-09-19 NOTE — Progress Notes (Signed)
[facility] [address] [facility] [facility]  Telephone Visit  Patient Name: Lindsey Cruz  [id number]  [id number]  Date of Service: 09/22/2018  I connected with the patient at 3:52pm by webcam and verified the patients identity using two identifiers.   I discussed the limitations, risks, security and privacy concerns of performing an evaluation and management service by webcam and the availability of in person appointments. I also discussed with the patient that there may be a patient responsible charge related to the service.  The patient expressed understanding and agrees to proceed.    Chief Complaint  Patient presents with  . Telephone Assessment  . Telephone Screen  . Vaginitis    The patient has been contacted via webcam for follow up visit due to concerns for spread of novel coronavirus. Today, she is complaining of vaginal itching and irritation. She has had these symptoms for a little over a week. States that she has tried OTC AZO to help treat the yeast infection and this has really provided no relief. She states that she has little vaginal discharge and denies lower abdominal pain, nausea, vomiting, or diarrhea.       Current Medication: Outpatient Encounter Medications as of 09/19/2018  Medication Sig  . atorvastatin (LIPITOR) 20 MG tablet Take 1 tablet (20 mg total) by mouth every evening. for cholesterol  . bisoprolol-hydrochlorothiazide (ZIAC) 2.5-6.25 MG tablet Take 1 tablet by mouth daily.  . cholecalciferol (VITAMIN D3) 25 MCG (1000 UT) tablet Take 1,000 Units by mouth daily.  . Dulaglutide (TRULICITY) 0.75 MG/0.5ML SOPN Inject 0.75 mg into the skin once a week.  . fexofenadine-pseudoephedrine (ALLEGRA-D ALLERGY & CONGESTION) 180-240 MG 24 hr tablet Take 1 tablet by mouth daily.  . hydrochlorothiazide (MICROZIDE) 12.5 MG capsule Take 1 capsule (12.5 mg total) by mouth daily.  Marland Kitchen ibuprofen (IBU) 600 MG tablet Take by mouth.  Marland Kitchen  omeprazole (PRILOSEC) 20 MG capsule Take 1 capsule (20 mg total) by mouth daily.  . sodium chloride (OCEAN) 0.65 % SOLN nasal spray Place 2 sprays into both nostrils every 2 (two) hours while awake.  . fluconazole (DIFLUCAN) 150 MG tablet Take 1 tablet po once. May repeat dose in 3 days as needed for persistent symptoms.  . fluticasone (FLONASE) 50 MCG/ACT nasal spray Place 1 spray into both nostrils 2 (two) times daily.  Marland Kitchen nystatin ointment (MYCOSTATIN) Apply 1 application topically 3 (three) times daily.   No facility-administered encounter medications on file as of 09/19/2018.     Surgical History: Past Surgical History:  Procedure Laterality Date  . BREAST CYST ASPIRATION Right 2014   neg  . BREAST EXCISIONAL BIOPSY Right 1983   neg  . TUBAL LIGATION      Medical History: Past Medical History:  Diagnosis Date  . Diabetes mellitus without complication (HCC)   . Hypercholesteremia   . Hypertension     Family History: Family History  Problem Relation Age of Onset  . Hypertension Mother   . Hyperlipidemia Mother   . Diabetes Mother   . Diabetes Maternal Grandmother     Social History   Socioeconomic History  . Marital status: Married    Spouse name: Not on file  . Number of children: Not on file  . Years of education: Not on file  . Highest education level: Not on file  Occupational History  . Not on file  Social Needs  . Financial resource strain: Not on file  . Food insecurity:  Worry: Not on file    Inability: Not on file  . Transportation needs:    Medical: Not on file    Non-medical: Not on file  Tobacco Use  . Smoking status: Never Smoker  . Smokeless tobacco: Never Used  Substance and Sexual Activity  . Alcohol use: Yes    Comment: occasional  . Drug use: Never  . Sexual activity: Not on file  Lifestyle  . Physical activity:    Days per week: Not on file    Minutes per session: Not on file  . Stress: Not on file  Relationships  . Social  connections:    Talks on phone: Not on file    Gets together: Not on file    Attends religious service: Not on file    Active member of club or organization: Not on file    Attends meetings of clubs or organizations: Not on file    Relationship status: Not on file  . Intimate partner violence:    Fear of current or ex partner: Not on file    Emotionally abused: Not on file    Physically abused: Not on file    Forced sexual activity: Not on file  Other Topics Concern  . Not on file  Social History Narrative  . Not on file      Review of Systems  Constitutional: Negative for chills, fatigue and unexpected weight change.  HENT: Negative for congestion, postnasal drip, rhinorrhea, sneezing and sore throat.   Respiratory: Negative for cough, chest tightness, shortness of breath and wheezing.   Cardiovascular: Negative for chest pain and palpitations.  Gastrointestinal: Negative for abdominal pain, constipation, diarrhea, nausea and vomiting.  Genitourinary: Positive for vaginal discharge. Negative for dysuria, frequency and urgency.       Vaginal itching and irritation.   Musculoskeletal: Negative for arthralgias, back pain, joint swelling and neck pain.  Skin: Negative for rash.  Hematological: Negative for adenopathy. Does not bruise/bleed easily.  Psychiatric/Behavioral: Behavioral problem: Depression.    Today's Vitals   09/19/18 1542  BP: 120/80  Pulse: 81  Weight: 255 lb (115.7 kg)  Height: 5\' 6"  (1.676 m)   Body mass index is 41.16 kg/m.   Observation/Objective:  The patient is alert and oriented. She is in no acute distress.    Assessment/Plan:  1. Vaginal candidiasis Diflucan 150mg  may be taken once to treat yeast infection. May repeat dose in three days for persistent symptoms. Add nystatin ointment externally up to three times daily to reduce external vaginal symptoms.  - fluconazole (DIFLUCAN) 150 MG tablet; Take 1 tablet po once. May repeat dose in 3 days  as needed for persistent symptoms.  Dispense: 3 tablet; Refill: 0 - nystatin ointment (MYCOSTATIN); Apply 1 application topically 3 (three) times daily.  Dispense: 30 g; Refill: 1  General Counseling: Kaylonnie verbalizes understanding of the findings of today's phone visit and agrees with plan of treatment. I have discussed any further diagnostic evaluation that may be needed or ordered today. We also reviewed her medications today. she has been encouraged to call the office with any questions or concerns that should arise related to todays visit.  This patient was seen by Vincent Gros FNP Collaboration with Dr Lyndon Code as a part of collaborative care agreement  Meds ordered this encounter  Medications  . fluconazole (DIFLUCAN) 150 MG tablet    Sig: Take 1 tablet po once. May repeat dose in 3 days as needed for persistent symptoms.  Dispense:  3 tablet    Refill:  0    Order Specific Question:   Supervising Provider    Answer:   Lyndon CodeKHAN, FOZIA M [[id number]]  . nystatin ointment (MYCOSTATIN)    Sig: Apply 1 application topically 3 (three) times daily.    Dispense:  30 g    Refill:  1    Order Specific Question:   Supervising Provider    Answer:   Lyndon CodeKHAN, FOZIA M [[id number]]    Time spent: 7625 Minutes    Dr Lyndon CodeFozia M Khan Internal medicine

## 2018-09-22 DIAGNOSIS — B373 Candidiasis of vulva and vagina: Secondary | ICD-10-CM | POA: Insufficient documentation

## 2018-09-22 DIAGNOSIS — B3731 Acute candidiasis of vulva and vagina: Secondary | ICD-10-CM | POA: Insufficient documentation

## 2018-10-03 ENCOUNTER — Ambulatory Visit: Payer: BLUE CROSS/BLUE SHIELD | Admitting: Podiatry

## 2018-10-03 ENCOUNTER — Encounter: Payer: Self-pay | Admitting: Podiatry

## 2018-10-03 ENCOUNTER — Ambulatory Visit (INDEPENDENT_AMBULATORY_CARE_PROVIDER_SITE_OTHER): Payer: BLUE CROSS/BLUE SHIELD

## 2018-10-03 ENCOUNTER — Other Ambulatory Visit: Payer: Self-pay

## 2018-10-03 VITALS — Temp 97.3°F

## 2018-10-03 DIAGNOSIS — M722 Plantar fascial fibromatosis: Secondary | ICD-10-CM | POA: Diagnosis not present

## 2018-10-03 MED ORDER — METHYLPREDNISOLONE 4 MG PO TBPK: ORAL_TABLET | ORAL | 0 refills | Status: DC

## 2018-10-03 MED ORDER — MELOXICAM 15 MG PO TABS: 15.0000 mg | ORAL_TABLET | Freq: Every day | ORAL | 3 refills | Status: DC

## 2018-10-03 NOTE — Patient Instructions (Signed)

## 2018-10-03 NOTE — Progress Notes (Signed)
Subjective:  Patient ID: Lindsey Cruz, female    DOB: [birth year],  MRN: [id number] HPI Chief Complaint  Patient presents with  . Foot Pain    Plantar heel right - aching x few months, AM pain, [facility] evaluated-dispensed walking boot-"didn't help", tried OTC inserts-"helped some", now feels pulling in achilles area  . Diabetes    Last a1c was 6.9 - diet controlled  . New Patient (Initial Visit)    53 y.o. female presents with the above complaint.   ROS: Denies fever chills nausea vomiting muscle aches pains calf pain back pain chest pain shortness of breath  Past Medical History:  Diagnosis Date  . Diabetes mellitus without complication (HCC)   . Hypercholesteremia   . Hypertension    Past Surgical History:  Procedure Laterality Date  . BREAST CYST ASPIRATION Right 2014   neg  . BREAST EXCISIONAL BIOPSY Right 1983   neg  . TUBAL LIGATION      Current Outpatient Medications:  .  fluticasone (FLONASE) 50 MCG/ACT nasal spray, Place 1 spray into both nostrils 2 (two) times daily., Disp: 16 g, Rfl: 0 .  Vitamin D, Ergocalciferol, (DRISDOL) 1.25 MG (50000 UT) CAPS capsule, , Disp: , Rfl:  .  atorvastatin (LIPITOR) 20 MG tablet, Take 1 tablet (20 mg total) by mouth every evening. for cholesterol, Disp: 90 tablet, Rfl: 1 .  bisoprolol-hydrochlorothiazide (ZIAC) 2.5-6.25 MG tablet, Take 1 tablet by mouth daily., Disp: 90 tablet, Rfl: 3 .  cholecalciferol (VITAMIN D3) 25 MCG (1000 UT) tablet, Take 1,000 Units by mouth daily., Disp: , Rfl:  .  Dulaglutide (TRULICITY) 0.75 MG/0.5ML SOPN, Inject 0.75 mg into the skin once a week., Disp: 12 pen, Rfl: 3 .  fexofenadine-pseudoephedrine (ALLEGRA-D ALLERGY & CONGESTION) 180-240 MG 24 hr tablet, Take 1 tablet by mouth daily., Disp: 30 tablet, Rfl: 0 .  hydrochlorothiazide (MICROZIDE) 12.5 MG capsule, TAKE 1 CAPSULE BY MOUTH  DAILY, Disp: 90 capsule, Rfl: 3 .  ibuprofen (IBU) 600 MG tablet, Take by mouth., Disp: , Rfl:  .  meloxicam  (MOBIC) 15 MG tablet, Take 1 tablet (15 mg total) by mouth daily., Disp: 30 tablet, Rfl: 3 .  methylPREDNISolone (MEDROL DOSEPAK) 4 MG TBPK tablet, 6 day dose pack - take as directed, Disp: 21 tablet, Rfl: 0 .  nystatin ointment (MYCOSTATIN), Apply 1 application topically 3 (three) times daily., Disp: 30 g, Rfl: 1 .  omeprazole (PRILOSEC) 20 MG capsule, Take 1 capsule (20 mg total) by mouth daily., Disp: 90 capsule, Rfl: 3 .  sodium chloride (OCEAN) 0.65 % SOLN nasal spray, Place 2 sprays into both nostrils every 2 (two) hours while awake., Disp: , Rfl: 0  No Known Allergies Review of Systems Objective:   Vitals:   10/03/18 1054  Temp: (!) 97.3 F (36.3 C)    General: Well developed, nourished, in no acute distress, alert and oriented x3   Dermatological: Skin is warm, dry and supple bilateral. Nails x 10 are well maintained; remaining integument appears unremarkable at this time. There are no open sores, no preulcerative lesions, no rash or signs of infection present.  Vascular: Dorsalis Pedis artery and Posterior Tibial artery pedal pulses are 2/4 bilateral with immedate capillary fill time. Pedal hair growth present. No varicosities and no lower extremity edema present bilateral.   Neruologic: Grossly intact via light touch bilateral. Vibratory intact via tuning fork bilateral. Protective threshold with Semmes Wienstein monofilament intact to all pedal sites bilateral. Patellar and Achilles deep tendon reflexes 2+ bilateral.  No Babinski or clonus noted bilateral.   Musculoskeletal: No gross boney pedal deformities bilateral. No pain, crepitus, or limitation noted with foot and ankle range of motion bilateral. Muscular strength 5/5 in all groups tested bilateral.  She has pain on palpation medial calcaneal tubercle of the right heel.  No pain on medial and lateral compression of the calcaneus.  Gait: Unassisted, Nonantalgic.    Radiographs:  Radiographs bilateral foot demonstrates  osseous architecture appears to be normal other than mild pes planus.  Soft tissue increase in density plantar fashion calcaneal insertion site with small distally oriented calcaneal heel spurs.  Assessment & Plan:   Assessment: Plantar fasciitis with pes planus right greater than left.    Plan: Discussed etiology pathology conservative surgical therapy start her on Medrol Dosepak to be followed by meloxicam.  Also injected the right heel with 20 mg Kenalog 5 mg Marcaine after Betadine skin prep.  Tolerated procedure well without complications.  Patient plantar fascial brace and a night splint.  Discussed appropriate shoe gear stretching exercise ice therapy and sugar modifications.     Ernesta Trabert T. [place] HazletonHyatt, [place] DakotaDPM

## 2018-10-07 ENCOUNTER — Ambulatory Visit: Payer: Self-pay | Admitting: Podiatry

## 2018-10-29 ENCOUNTER — Encounter: Payer: Self-pay | Admitting: Internal Medicine

## 2018-11-06 ENCOUNTER — Other Ambulatory Visit: Payer: Self-pay | Admitting: Adult Health

## 2018-11-06 ENCOUNTER — Encounter: Payer: Self-pay | Admitting: Adult Health

## 2018-11-06 ENCOUNTER — Ambulatory Visit: Payer: BLUE CROSS/BLUE SHIELD | Admitting: Adult Health

## 2018-11-06 ENCOUNTER — Other Ambulatory Visit: Payer: Self-pay

## 2018-11-06 ENCOUNTER — Ambulatory Visit (INDEPENDENT_AMBULATORY_CARE_PROVIDER_SITE_OTHER): Payer: BLUE CROSS/BLUE SHIELD | Admitting: Podiatry

## 2018-11-06 ENCOUNTER — Encounter: Payer: Self-pay | Admitting: Podiatry

## 2018-11-06 VITALS — BP 138/86 | HR 70 | Resp 16 | Ht 66.0 in | Wt 251.0 lb

## 2018-11-06 VITALS — Temp 97.1°F

## 2018-11-06 DIAGNOSIS — R300 Dysuria: Secondary | ICD-10-CM | POA: Diagnosis not present

## 2018-11-06 DIAGNOSIS — R2231 Localized swelling, mass and lump, right upper limb: Secondary | ICD-10-CM

## 2018-11-06 DIAGNOSIS — E1165 Type 2 diabetes mellitus with hyperglycemia: Secondary | ICD-10-CM | POA: Diagnosis not present

## 2018-11-06 DIAGNOSIS — E559 Vitamin D deficiency, unspecified: Secondary | ICD-10-CM | POA: Diagnosis not present

## 2018-11-06 DIAGNOSIS — Z0001 Encounter for general adult medical examination with abnormal findings: Secondary | ICD-10-CM | POA: Diagnosis not present

## 2018-11-06 DIAGNOSIS — M722 Plantar fascial fibromatosis: Secondary | ICD-10-CM

## 2018-11-06 DIAGNOSIS — E611 Iron deficiency: Secondary | ICD-10-CM | POA: Diagnosis not present

## 2018-11-06 LAB — POCT GLYCOSYLATED HEMOGLOBIN (HGB A1C): Hemoglobin A1C: 7.6 % — AB (ref 4.0–5.6)

## 2018-11-06 MED ORDER — DULAGLUTIDE 1.5 MG/0.5ML ~~LOC~~ SOAJ: 1.5000 mg | SUBCUTANEOUS | 1 refills | Status: DC

## 2018-11-06 NOTE — Progress Notes (Signed)
She presents today states that her bilateral heels are doing so much better than they were.  The left one is starting to hurt a little bit.  Objective: Vital signs are stable she alert oriented x3.  Pulses are palpable.  She has pain on palpation medial calcaneal tubercle of the left but none on the right.  Assessment: Residual plantar fasciitis right is resolving and starting to develop on the left.  Plan: I injected left heel today with 20 mg Kenalog 5 mg Marcaine after Betadine skin prep tolerated procedure well without complications.  And dispensed another plantar fascial brace.

## 2018-11-06 NOTE — Progress Notes (Signed)
[facility] [address] [place], [place] [address]  Internal MEDICINE  Office Visit Note  Patient Name: Lindsey Cruz  [id number] 11, 2067  [id number]  Date of Service: 11/06/2018  Chief Complaint  Patient presents with  . Annual Exam  . Diabetes  . Hypertension  . Hyperlipidemia     HPI Pt is here for routine health maintenance examination.  She has a history of DM, HTn and HLD.  She had her labs drawn this morning for follow up.  She reports that her diabetes has been well controlled.  She denies any issues with hypertension or hyperlipidemia at this time.  She denies nicotine, alcohol, or illicit drug use.  Current Medication: Outpatient Encounter Medications as of 11/06/2018  Medication Sig  . atorvastatin (LIPITOR) 20 MG tablet Take 1 tablet (20 mg total) by mouth every evening. for cholesterol  . bisoprolol-hydrochlorothiazide (ZIAC) 2.5-6.25 MG tablet Take 1 tablet by mouth daily.  . Dulaglutide (TRULICITY) 0.75 MG/0.5ML SOPN Inject 0.75 mg into the skin once a week.  . fexofenadine-pseudoephedrine (ALLEGRA-D ALLERGY & CONGESTION) 180-240 MG 24 hr tablet Take 1 tablet by mouth daily.  . hydrochlorothiazide (MICROZIDE) 12.5 MG capsule TAKE 1 CAPSULE BY MOUTH  DAILY  . ibuprofen (IBU) 600 MG tablet Take by mouth.  . meloxicam (MOBIC) 15 MG tablet Take 1 tablet (15 mg total) by mouth daily.  . Multiple Vitamins-Minerals (EMERGEN-C VITAMIN D/CALCIUM PO) Take by mouth.  . nystatin ointment (MYCOSTATIN) Apply 1 application topically 3 (three) times daily.  . fluticasone (FLONASE) 50 MCG/ACT nasal spray Place 1 spray into both nostrils 2 (two) times daily.  . [DISCONTINUED] cholecalciferol (VITAMIN D3) 25 MCG (1000 UT) tablet Take 1,000 Units by mouth daily.  . [DISCONTINUED] omeprazole (PRILOSEC) 20 MG capsule Take 1 capsule (20 mg total) by mouth daily. (Patient not taking: Reported on 11/06/2018)  . [DISCONTINUED] sodium chloride (OCEAN) 0.65 % SOLN nasal spray Place 2  sprays into both nostrils every 2 (two) hours while awake. (Patient not taking: Reported on 11/06/2018)  . [DISCONTINUED] Vitamin D, Ergocalciferol, (DRISDOL) 1.25 MG (50000 UT) CAPS capsule    No facility-administered encounter medications on file as of 11/06/2018.     Surgical History: Past Surgical History:  Procedure Laterality Date  . BREAST CYST ASPIRATION Right 2014   neg  . BREAST EXCISIONAL BIOPSY Right 1983   neg  . TUBAL LIGATION      Medical History: Past Medical History:  Diagnosis Date  . Diabetes mellitus without complication (HCC)   . Hypercholesteremia   . Hypertension     Family History: Family History  Problem Relation Age of Onset  . Hypertension Mother   . Hyperlipidemia Mother   . Diabetes Mother   . Diabetes Maternal Grandmother       Review of Systems  Constitutional: Negative for chills, fatigue and unexpected weight change.  HENT: Negative for congestion, rhinorrhea, sneezing and sore throat.        Sensation of lump in throat.  Describes as "golfball"  Eyes: Negative for photophobia, pain and redness.  Respiratory: Negative for cough, chest tightness and shortness of breath.   Cardiovascular: Negative for chest pain and palpitations.  Gastrointestinal: Negative for abdominal pain, constipation, diarrhea, nausea and vomiting.  Endocrine: Negative.   Genitourinary: Negative for dysuria and frequency.  Musculoskeletal: Negative for arthralgias, back pain, joint swelling and neck pain.  Skin: Negative for rash.  Allergic/Immunologic: Negative.   Neurological: Negative for tremors and numbness.  Hematological: Negative for adenopathy. Does  not bruise/bleed easily.  Psychiatric/Behavioral: Negative for behavioral problems and sleep disturbance. The patient is not nervous/anxious.      Vital Signs: BP 138/86   Pulse 70   Resp 16   Ht 5\' 6"  (1.676 m)   Wt 251 lb (113.9 kg)   SpO2 98%   BMI 40.51 kg/m    Physical Exam Vitals signs and  nursing note reviewed.  Constitutional:      General: She is not in acute distress.    Appearance: She is well-developed. She is not diaphoretic.  HENT:     Head: Normocephalic and atraumatic.     Mouth/Throat:     Pharynx: No oropharyngeal exudate.  Eyes:     Pupils: Pupils are equal, round, and reactive to light.  Neck:     Musculoskeletal: Normal range of motion and neck supple.     Thyroid: No thyromegaly.     Vascular: No JVD.     Trachea: No tracheal deviation.  Cardiovascular:     Rate and Rhythm: Normal rate and regular rhythm.     Heart sounds: Normal heart sounds. No murmur. No friction rub. No gallop.   Pulmonary:     Effort: Pulmonary effort is normal. No respiratory distress.     Breath sounds: Normal breath sounds. No wheezing or rales.  Chest:     Chest wall: No tenderness.  Abdominal:     Palpations: Abdomen is soft.     Tenderness: There is no abdominal tenderness. There is no guarding.  Musculoskeletal: Normal range of motion.     Comments: Right axillary mass  Lymphadenopathy:     Cervical: No cervical adenopathy.  Skin:    General: Skin is warm and dry.  Neurological:     Mental Status: She is alert and oriented to person, place, and time.     Cranial Nerves: No cranial nerve deficit.  Psychiatric:        Behavior: Behavior normal.        Thought Content: Thought content normal.        Judgment: Judgment normal.      LABS: No results found for this or any previous visit (from the past 2160 hour(s)).   Assessment/Plan: 1. Encounter for general adult medical examination with abnormal findings Routine health maintenance is up-to-date at this time.  2. Uncontrolled type 2 diabetes mellitus with hyperglycemia (HCC) A1c 7.6 today.  We will continue to follow.  Refilled patient's Trulicity prescription at this time. - POCT HgB A1C - Dulaglutide (TRULICITY) 1.5 MG/0.5ML SOPN; Inject 1.5 mg into the skin once a week.  Dispense: 12 pen; Refill: 1  3.  Axillary mass, right Mammogram ordered for patient due to right axillary mass that is palpable on exam.  Also order ultrasound. - MM Digital Diagnostic Bilat; Future - US BREAST COMPLETE UNI RIGHT INC AXILLA; Future  4. Dysuria - UA/M w/rflx Culture, Routine  General Counseling: Wonder verbalizes understanding of the findings of todays visit and agrees with plan of treatment. I have discussed any further diagnostic evaluation that may be needed or ordered today. We also reviewed her medications today. she has been encouraged to call the office with any questions or concerns that should arise related to todays visit.   Orders Placed This Encounter  Procedures  . UA/M w/rflx Culture, Routine  . POCT HgB A1C    No orders of the defined types were placed in this encounter.   Time spent: 30 Minutes   This patient was seen  by Orson Gear AGNP-C in Collaboration with Dr Lavera Guise as a part of collaborative care agreement    Kendell Bane AGNP-C Internal Medicine

## 2018-11-07 LAB — MICROSCOPIC EXAMINATION: Casts: NONE SEEN /lpf

## 2018-11-07 LAB — UA/M W/RFLX CULTURE, ROUTINE
Bilirubin, UA: NEGATIVE
Glucose, UA: NEGATIVE
Ketones, UA: NEGATIVE
Leukocytes,UA: NEGATIVE
Nitrite, UA: NEGATIVE
Protein,UA: NEGATIVE
RBC, UA: NEGATIVE
Specific Gravity, UA: 1.018 (ref 1.005–1.030)
Urobilinogen, Ur: 0.2 mg/dL (ref 0.2–1.0)
pH, UA: 5 (ref 5.0–7.5)

## 2018-11-07 LAB — IRON AND TIBC
Iron Saturation: 23 % (ref 15–55)
Iron: 77 ug/dL (ref 27–159)
Total Iron Binding Capacity: 328 ug/dL (ref 250–450)
UIBC: 251 ug/dL (ref 131–425)

## 2018-11-07 LAB — VITAMIN D 25 HYDROXY (VIT D DEFICIENCY, FRACTURES): Vit D, 25-Hydroxy: 24.3 ng/mL — ABNORMAL LOW (ref 30.0–100.0)

## 2018-11-22 ENCOUNTER — Emergency Department: Payer: BC Managed Care – PPO

## 2018-11-22 ENCOUNTER — Emergency Department
Admission: EM | Admit: 2018-11-22 | Discharge: 2018-11-22 | Disposition: A | Discharge status: Home / Self Care | Payer: BC Managed Care – PPO | Attending: Emergency Medicine | Admitting: Emergency Medicine

## 2018-11-22 ENCOUNTER — Encounter: Payer: Self-pay | Admitting: Emergency Medicine

## 2018-11-22 DIAGNOSIS — I10 Essential (primary) hypertension: Secondary | ICD-10-CM | POA: Diagnosis not present

## 2018-11-22 DIAGNOSIS — Z79899 Other long term (current) drug therapy: Secondary | ICD-10-CM | POA: Diagnosis not present

## 2018-11-22 DIAGNOSIS — M940 Chondrocostal junction syndrome [Tietze]: Secondary | ICD-10-CM | POA: Diagnosis not present

## 2018-11-22 DIAGNOSIS — R0789 Other chest pain: Secondary | ICD-10-CM | POA: Diagnosis not present

## 2018-11-22 DIAGNOSIS — R079 Chest pain, unspecified: Secondary | ICD-10-CM | POA: Diagnosis not present

## 2018-11-22 DIAGNOSIS — E119 Type 2 diabetes mellitus without complications: Secondary | ICD-10-CM | POA: Diagnosis not present

## 2018-11-22 LAB — CBC
HCT: 39.9 % (ref 36.0–46.0)
Hemoglobin: 13.2 g/dL (ref 12.0–15.0)
MCH: 30.7 pg (ref 26.0–34.0)
MCHC: 33.1 g/dL (ref 30.0–36.0)
MCV: 92.8 fL (ref 80.0–100.0)
Platelets: 218 10*3/uL (ref 150–400)
RBC: 4.3 MIL/uL (ref 3.87–5.11)
RDW: 12.9 % (ref 11.5–15.5)
WBC: 8 10*3/uL (ref 4.0–10.5)
nRBC: 0 % (ref 0.0–0.2)

## 2018-11-22 LAB — BASIC METABOLIC PANEL
Anion gap: 6 (ref 5–15)
BUN: 12 mg/dL (ref 6–20)
CO2: 28 mmol/L (ref 22–32)
Calcium: 8.7 mg/dL — ABNORMAL LOW (ref 8.9–10.3)
Chloride: 103 mmol/L (ref 98–111)
Creatinine, Ser: 0.81 mg/dL (ref 0.44–1.00)
GFR calc Af Amer: >60 mL/min (ref >60)
GFR calc non Af Amer: >60 mL/min (ref >60)
Glucose, Bld: 135 mg/dL — ABNORMAL HIGH (ref 70–99)
Potassium: 3.2 mmol/L — ABNORMAL LOW (ref 3.5–5.1)
Sodium: 137 mmol/L (ref 135–145)

## 2018-11-22 LAB — FIBRIN DERIVATIVES D-DIMER (ARMC ONLY): Fibrin derivatives D-dimer (ARMC): 190.54 ng/mL (FEU) (ref 0.00–499.00)

## 2018-11-22 LAB — TROPONIN I
Troponin I: <0.03 ng/mL (ref <0.03)
Troponin I: <0.03 ng/mL (ref <0.03)

## 2018-11-22 MED ORDER — POTASSIUM CHLORIDE CRYS ER 20 MEQ PO TBCR
40.0000 meq | EXTENDED_RELEASE_TABLET | Freq: Once | ORAL | Status: AC
  Administered 2018-11-22: 40 meq via ORAL
  Filled 2018-11-22: qty 2

## 2018-11-22 MED ORDER — SODIUM CHLORIDE 0.9% FLUSH: 3.0000 mL | Freq: Once | INTRAVENOUS | Status: DC

## 2018-11-22 NOTE — ED Triage Notes (Signed)
Pt c/o left sided chest pain that started on Thursday, relieved by baby asprin. Pt sts pain started again this AM while trying to sleep. Pt denies cough and fever. Pain does not radiate. Pain is intermittent.

## 2018-11-22 NOTE — ED Provider Notes (Signed)
Repeat troponin is negative, she is cleared for outpatient follow-up.   Earleen Newport, MD 11/22/18 [phone]

## 2018-11-22 NOTE — ED Provider Notes (Signed)
[facility] Emergency Department Provider Note _   First MD Initiated Contact with Patient 11/22/18 [id number]     (approximate)  I have reviewed the triage vital signs and the nursing notes.   HISTORY  Chief Complaint Chest Pain    HPI Margaretmary Lombardnita E Vigilante is a 53 y.o. female with below list of previous medical conditions including hypertension diabetes and hypercholesterolemia resents to the emergency department secondary to acute onset of left-sided last central chest pain which patient first noted on Thursday which was completely resolved with aspirin but reoccurred tonight while at home going to sleep.  Patient states that the pain is worse with deep inspiration.  Patient denies any dyspnea.  Patient denies any diaphoresis         Past Medical History:  Diagnosis Date  . Diabetes mellitus without complication (HCC)   . Hypercholesteremia   . Hypertension     Patient Active Problem List   Diagnosis Date Noted  . Vaginal candidiasis 09/22/2018  . Diabetes mellitus without complication (HCC) 02/27/2018  . Essential hypertension 02/27/2018    Past Surgical History:  Procedure Laterality Date  . BREAST CYST ASPIRATION Right 2014   neg  . BREAST EXCISIONAL BIOPSY Right 1983   neg  . TUBAL LIGATION      Prior to Admission medications   Medication Sig Start Date End Date Taking? Authorizing Provider  atorvastatin (LIPITOR) 20 MG tablet Take 1 tablet (20 mg total) by mouth every evening. for cholesterol 06/10/18   Carlean JewsBoscia, Heather E, NP  bisoprolol-hydrochlorothiazide (ZIAC) 2.5-6.25 MG tablet Take 1 tablet by mouth daily. 10/25/17   Lyndon CodeKhan, Fozia M, MD  Dulaglutide (TRULICITY) 0.75 MG/0.5ML SOPN Inject 0.75 mg into the skin once a week. 10/25/17   Lyndon CodeKhan, Fozia M, MD  Dulaglutide (TRULICITY) 1.5 MG/0.5ML SOPN Inject 1.5 mg into the skin once a week. 11/06/18   Johnna AcostaScarboro, Adam J, NP  fexofenadine-pseudoephedrine (ALLEGRA-D ALLERGY & CONGESTION) 180-240 MG 24 hr  tablet Take 1 tablet by mouth daily. 01/16/16   Hassan RowanWade, Eugene, MD  fluticasone (FLONASE) 50 MCG/ACT nasal spray Place 1 spray into both nostrils 2 (two) times daily. 06/30/15 10/03/18  Betancourt, Jarold Songina A, NP  hydrochlorothiazide (MICROZIDE) 12.5 MG capsule TAKE 1 CAPSULE BY MOUTH  DAILY 09/23/18   Johnna AcostaScarboro, Adam J, NP  ibuprofen (IBU) 600 MG tablet Take by mouth. 12/25/16   [provider]  meloxicam (MOBIC) 15 MG tablet Take 1 tablet (15 mg total) by mouth daily. 10/03/18   Hyatt, Max T, DPM  Multiple Vitamins-Minerals (EMERGEN-C VITAMIN D/CALCIUM PO) Take by mouth.    [provider]  nystatin ointment (MYCOSTATIN) Apply 1 application topically 3 (three) times daily. 09/19/18   Carlean JewsBoscia, Heather E, NP    Allergies Patient has no known allergies.  Family History  Problem Relation Age of Onset  . Hypertension Mother   . Hyperlipidemia Mother   . Diabetes Mother   . Diabetes Maternal Grandmother     Social History Social History   Tobacco Use  . Smoking status: Never Smoker  . Smokeless tobacco: Never Used  Substance Use Topics  . Alcohol use: Yes    Comment: occasional  . Drug use: Never    Review of Systems Constitutional: No fever/chills Eyes: No visual changes. ENT: No sore throat. Cardiovascular: Denies chest pain. Respiratory: Denies shortness of breath. Gastrointestinal: No abdominal pain.  No nausea, no vomiting.  No diarrhea.  No constipation. Genitourinary: Negative for dysuria. Musculoskeletal: Negative for neck pain.  Negative for back pain. Integumentary: Negative for rash. Neurological: Negative for headaches, focal weakness or numbness.   ____________________________________________   PHYSICAL EXAM:  VITAL SIGNS: ED Triage Vitals [11/22/18 0302]  Enc Vitals Group     BP (!) 149/93     Pulse Rate 99     Resp 18     Temp 98.6 F (37 C)     Temp Source Oral     SpO2 100 %     Weight      Height      Head Circumference      Peak Flow       Pain Score      Pain Loc      Pain Edu?      Excl. in GC?     Constitutional: Alert and oriented. Well appearing and in no acute distress. Eyes: Conjunctivae are normal. PERRL. EOMI. Mouth/Throat: Mucous membranes are moist.  Oropharynx non-erythematous. Neck: No stridor.   Chest: Pain to palpation left costosternal area. Cardiovascular: Normal rate, regular rhythm. Good peripheral circulation. Grossly normal heart sounds. Respiratory: Normal respiratory effort.  No retractions. No audible wheezing. Gastrointestinal: Soft and nontender. No distention.  Musculoskeletal: No lower extremity tenderness nor edema. No gross deformities of extremities. Neurologic:  Normal speech and language. No gross focal neurologic deficits are appreciated.  Skin:  Skin is warm, dry and intact. No rash noted. Psychiatric: Mood and affect are normal. Speech and behavior are normal.  ____________________________________________   LABS (all labs ordered are listed, but only abnormal results are displayed)  Labs Reviewed  BASIC METABOLIC PANEL - Abnormal; Notable for the following components:      Result Value   Potassium 3.2 (*)    Glucose, Bld 135 (*)    Calcium 8.7 (*)    All other components within normal limits  CBC  TROPONIN I  FIBRIN DERIVATIVES D-DIMER ([facility] ONLY)  TROPONIN I  POC URINE PREG, ED   ____________________________________________  EKG  ED ECG REPORT I, [place] N BROWN, the attending physician, personally viewed and interpreted this ECG.   Date: 11/22/2018  EKG Time: 2:58 AM  Rate: 92  Rhythm: Normal sinus rhythm  Axis: Normal  Intervals: Normal  ST&T Change: None  ____________________________________________  RADIOLOGY I, [place] N BROWN, personally viewed and evaluated these images (plain radiographs) as part of my medical decision making, as well as reviewing the written report by the radiologist.  ED MD interpretation: Borderline cardiomegaly without failure  acute cardiopulmonary disease noted per radiologist  Official radiology report(s): Dg Chest 2 View  Result Date: 11/22/2018 CLINICAL DATA:  Left-sided chest pain beginning yesterday. Intermittent pain since. Initial encounter. EXAM: CHEST - 2 VIEW COMPARISON:  Two-view chest x-ray 04/19/2011 FINDINGS: The heart size is upper limits of normal. There is no edema or effusion. No focal airspace disease is present. The visualized soft tissues and bony thorax are unremarkable. IMPRESSION: 1. Borderline cardiomegaly without failure. 2. No acute cardiopulmonary disease. Electronically Signed   By: Marin Robertshristopher  Mattern M.D.   On: 11/22/2018 05:28     Procedures   ____________________________________________   INITIAL IMPRESSION / MDM / ASSESSMENT AND PLAN / ED COURSE  As part of my medical decision making, I reviewed the following data within the electronic MEDICAL RECORD NUMBER  53 year old female presenting with above-stated history and physical exam secondary to chest pain which is reproduced on exam.  Considered possibly of CAD/MI and as such EKG was performed which revealed no evidence of ischemia or infarction.  Troponin x1 is negative will obtain a second troponin.  Also consider possibility of PE and a low risk patient as such d-dimer was performed which was negative.  *JAMELLA GRAYER was evaluated in Emergency Department on 11/22/2018 for the symptoms described in the history of present illness. She was evaluated in the context of the global COVID-19 pandemic, which necessitated consideration that the patient might be at risk for infection with the SARS-CoV-2 virus that causes COVID-19. Institutional protocols and algorithms that pertain to the evaluation of patients at risk for COVID-19 are in a state of rapid change based on information released by regulatory bodies including the CDC and federal and state organizations. These policies and algorithms were followed during the patient's care in the ED.   Some ED evaluations and interventions may be delayed as a result of limited staffing during the pandemic.*    ____________________________________________  FINAL CLINICAL IMPRESSION(S) / ED DIAGNOSES  Final diagnoses:  Costochondritis, acute  Nonspecific chest pain     MEDICATIONS GIVEN DURING THIS VISIT:  Medications  sodium chloride flush (NS) 0.9 % injection 3 mL (has no administration in time range)  potassium chloride SA (K-DUR) CR tablet 40 mEq (40 mEq Oral Given 11/22/18 0548)     ED Discharge Orders    None       Note:  This document was prepared using Dragon voice recognition software and may include unintentional dictation errors.   Gregor Hams, MD 11/22/18 [id number]

## 2018-11-24 ENCOUNTER — Encounter: Payer: Self-pay | Admitting: Adult Health

## 2018-11-27 ENCOUNTER — Other Ambulatory Visit: Payer: Self-pay | Admitting: Internal Medicine

## 2018-11-27 DIAGNOSIS — I10 Essential (primary) hypertension: Secondary | ICD-10-CM

## 2018-11-28 ENCOUNTER — Other Ambulatory Visit: Payer: Self-pay | Admitting: Nurse Practitioner

## 2018-11-28 MED ORDER — ATORVASTATIN CALCIUM 20 MG PO TABS: 20.0000 mg | ORAL_TABLET | Freq: Every evening | ORAL | 1 refills | Status: DC

## 2018-12-04 ENCOUNTER — Ambulatory Visit: Payer: BC Managed Care – PPO | Admitting: Podiatry

## 2018-12-09 DIAGNOSIS — R070 Pain in throat: Secondary | ICD-10-CM | POA: Diagnosis not present

## 2018-12-09 DIAGNOSIS — K219 Gastro-esophageal reflux disease without esophagitis: Secondary | ICD-10-CM | POA: Diagnosis not present

## 2019-01-20 DIAGNOSIS — K219 Gastro-esophageal reflux disease without esophagitis: Secondary | ICD-10-CM | POA: Diagnosis not present

## 2019-01-24 DIAGNOSIS — R6889 Other general symptoms and signs: Secondary | ICD-10-CM | POA: Diagnosis not present

## 2019-01-25 ENCOUNTER — Other Ambulatory Visit: Payer: Self-pay

## 2019-01-25 DIAGNOSIS — Z20822 Contact with and (suspected) exposure to covid-19: Secondary | ICD-10-CM

## 2019-01-26 LAB — NOVEL CORONAVIRUS, NAA: SARS-CoV-2, NAA: NOT DETECTED

## 2019-02-05 ENCOUNTER — Encounter: Payer: Self-pay | Admitting: Adult Health

## 2019-02-12 ENCOUNTER — Ambulatory Visit: Payer: BC Managed Care – PPO | Admitting: Adult Health

## 2019-02-18 ENCOUNTER — Other Ambulatory Visit: Payer: Self-pay

## 2019-02-18 ENCOUNTER — Encounter: Payer: Self-pay | Admitting: Adult Health

## 2019-02-18 ENCOUNTER — Ambulatory Visit: Payer: BC Managed Care – PPO | Admitting: Adult Health

## 2019-02-18 VITALS — BP 132/89 | HR 80 | Resp 16 | Ht 66.0 in | Wt 254.0 lb

## 2019-02-18 DIAGNOSIS — E1165 Type 2 diabetes mellitus with hyperglycemia: Secondary | ICD-10-CM

## 2019-02-18 DIAGNOSIS — I10 Essential (primary) hypertension: Secondary | ICD-10-CM | POA: Diagnosis not present

## 2019-02-18 DIAGNOSIS — Z6841 Body Mass Index (BMI) 40.0 and over, adult: Secondary | ICD-10-CM | POA: Diagnosis not present

## 2019-02-18 LAB — POCT GLYCOSYLATED HEMOGLOBIN (HGB A1C): Hemoglobin A1C: 6.3 % — AB (ref 4.0–5.6)

## 2019-02-18 NOTE — Progress Notes (Signed)
[facility] [address] [place], [place] [address]  Internal MEDICINE  Office Visit Note  Patient Name: Lindsey Cruz  [id number]  [id number]  Date of Service: 02/18/2019  Chief Complaint  Patient presents with  . Medical Management of Chronic Issues    32month follow up labs .   Marland Kitchen Diabetes  . Hypertension    HPI  Pt is here for follow up on HTN and DM.  She reports she has recently increased her trulicity to 1.5mg  weekly.  She denies any side effects or issues taking this.  Her A1C on her employment labs on 01/27/2019 was 6.6, today in office she is 6.3.  Overall she is doing well, denies any issues. She has been taking her HTN meds, and denise any side effects or issues.  Denies Chest pain, Shortness of breath, palpitations, headache, or blurred vision.    Current Medication: Outpatient Encounter Medications as of 02/18/2019  Medication Sig  . atorvastatin (LIPITOR) 20 MG tablet Take 1 tablet (20 mg total) by mouth every evening. for cholesterol  . bisoprolol-hydrochlorothiazide (ZIAC) 2.5-6.25 MG tablet TAKE 1 TABLET BY MOUTH  DAILY  . Dulaglutide (TRULICITY) 1.5 OI/7.8MV SOPN Inject 1.5 mg into the skin once a week.  . fexofenadine-pseudoephedrine (ALLEGRA-D ALLERGY & CONGESTION) 180-240 MG 24 hr tablet Take 1 tablet by mouth daily.  . hydrochlorothiazide (MICROZIDE) 12.5 MG capsule TAKE 1 CAPSULE BY MOUTH  DAILY  . ibuprofen (IBU) 600 MG tablet Take by mouth.  . meloxicam (MOBIC) 15 MG tablet Take 1 tablet (15 mg total) by mouth daily.  . Multiple Vitamins-Minerals (EMERGEN-C VITAMIN D/CALCIUM PO) Take by mouth.  . nystatin ointment (MYCOSTATIN) Apply 1 application topically 3 (three) times daily.  . fluticasone (FLONASE) 50 MCG/ACT nasal spray Place 1 spray into both nostrils 2 (two) times daily.  . [DISCONTINUED] Dulaglutide (TRULICITY) 6.72 CN/4.7SJ SOPN Inject 0.75 mg into the skin once a week.   No facility-administered encounter medications on file as of  02/18/2019.     Surgical History: Past Surgical History:  Procedure Laterality Date  . BREAST CYST ASPIRATION Right 2014   neg  . BREAST EXCISIONAL BIOPSY Right 1983   neg  . TUBAL LIGATION      Medical History: Past Medical History:  Diagnosis Date  . Diabetes mellitus without complication ([place])   . Hypercholesteremia   . Hypertension     Family History: Family History  Problem Relation Age of Onset  . Hypertension Mother   . Hyperlipidemia Mother   . Diabetes Mother   . Diabetes Maternal Grandmother     Social History   Socioeconomic History  . Marital status: Married    Spouse name: Not on file  . Number of children: Not on file  . Years of education: Not on file  . Highest education level: Not on file  Occupational History  . Not on file  Social Needs  . Financial resource strain: Not on file  . Food insecurity    Worry: Not on file    Inability: Not on file  . Transportation needs    Medical: Not on file    Non-medical: Not on file  Tobacco Use  . Smoking status: Never Smoker  . Smokeless tobacco: Never Used  Substance and Sexual Activity  . Alcohol use: Yes    Comment: occasional  . Drug use: Never  . Sexual activity: Not on file  Lifestyle  . Physical activity    Days per week: Not on file  Minutes per session: Not on file  . Stress: Not on file  Relationships  . Social Musician on phone: Not on file    Gets together: Not on file    Attends religious service: Not on file    Active member of club or organization: Not on file    Attends meetings of clubs or organizations: Not on file    Relationship status: Not on file  . Intimate partner violence    Fear of current or ex partner: Not on file    Emotionally abused: Not on file    Physically abused: Not on file    Forced sexual activity: Not on file  Other Topics Concern  . Not on file  Social History Narrative  . Not on file      Review of Systems  Constitutional:  Negative for chills, fatigue and unexpected weight change.  HENT: Negative for congestion, rhinorrhea, sneezing and sore throat.   Eyes: Negative for photophobia, pain and redness.  Respiratory: Negative for cough, chest tightness and shortness of breath.   Cardiovascular: Negative for chest pain and palpitations.  Gastrointestinal: Negative for abdominal pain, constipation, diarrhea, nausea and vomiting.  Endocrine: Negative.   Genitourinary: Negative for dysuria and frequency.  Musculoskeletal: Negative for arthralgias, back pain, joint swelling and neck pain.  Skin: Negative for rash.  Allergic/Immunologic: Negative.   Neurological: Negative for tremors and numbness.  Hematological: Negative for adenopathy. Does not bruise/bleed easily.  Psychiatric/Behavioral: Negative for behavioral problems and sleep disturbance. The patient is not nervous/anxious.     Vital Signs: BP 132/89   Pulse 80   Resp 16   Ht 5\' 6"  (1.676 m)   Wt 254 lb (115.2 kg)   SpO2 98%   BMI 41.00 kg/m    Physical Exam Vitals signs and nursing note reviewed.  Constitutional:      General: She is not in acute distress.    Appearance: She is well-developed. She is not diaphoretic.  HENT:     Head: Normocephalic and atraumatic.     Mouth/Throat:     Pharynx: No oropharyngeal exudate.  Eyes:     Pupils: Pupils are equal, round, and reactive to light.  Neck:     Musculoskeletal: Normal range of motion and neck supple.     Thyroid: No thyromegaly.     Vascular: No JVD.     Trachea: No tracheal deviation.  Cardiovascular:     Rate and Rhythm: Normal rate and regular rhythm.     Heart sounds: Normal heart sounds. No murmur. No friction rub. No gallop.   Pulmonary:     Effort: Pulmonary effort is normal. No respiratory distress.     Breath sounds: Normal breath sounds. No wheezing or rales.  Chest:     Chest wall: No tenderness.  Abdominal:     Palpations: Abdomen is soft.     Tenderness: There is no  abdominal tenderness. There is no guarding.  Musculoskeletal: Normal range of motion.  Lymphadenopathy:     Cervical: No cervical adenopathy.  Skin:    General: Skin is warm and dry.  Neurological:     Mental Status: She is alert and oriented to person, place, and time.     Cranial Nerves: No cranial nerve deficit.  Psychiatric:        Behavior: Behavior normal.        Thought Content: Thought content normal.        Judgment: Judgment normal.  Assessment/Plan: 1. Uncontrolled type 2 diabetes mellitus with hyperglycemia (HCC) 6.3 today, doing well.  Continue Trulicity.  - POCT HgB A1C  2. Essential hypertension Stable, continue current medications.   3. Class 3 severe obesity due to excess calories with serious comorbidity and body mass index (BMI) of 40.0 to 44.9 in adult [facility](HCC) Obesity Counseling: Risk Assessment: An assessment of behavioral risk factors was made today and includes lack of exercise sedentary lifestyle, lack of portion control and poor dietary habits.  Risk Modification Advice: She was counseled on portion control guidelines. Restricting daily caloric intake to. . The detrimental long term effects of obesity on her health and ongoing poor compliance was also discussed with the patient.    General Counseling: Lindsey Cruz verbalizes understanding of the findings of todays visit and agrees with plan of treatment. I have discussed any further diagnostic evaluation that may be needed or ordered today. We also reviewed her medications today. she has been encouraged to call the office with any questions or concerns that should arise related to todays visit.    Orders Placed This Encounter  Procedures  . POCT HgB A1C    No orders of the defined types were placed in this encounter.   Time spent: 20 Minutes   This patient was seen by Blima LedgerAdam Karmah Potocki AGNP-C in Collaboration with Dr Lyndon CodeFozia M Khan as a part of collaborative care agreement     Johnna AcostaAdam J. Andriy Sherk  AGNP-C Internal medicine

## 2019-03-07 ENCOUNTER — Other Ambulatory Visit: Payer: Self-pay | Admitting: Adult Health

## 2019-03-07 DIAGNOSIS — E1165 Type 2 diabetes mellitus with hyperglycemia: Secondary | ICD-10-CM

## 2019-04-28 ENCOUNTER — Telehealth: Payer: Self-pay

## 2019-04-28 NOTE — Telephone Encounter (Signed)
Confirmed appointment with patient. klh °

## 2019-04-30 ENCOUNTER — Other Ambulatory Visit: Payer: Self-pay | Admitting: Adult Health

## 2019-04-30 ENCOUNTER — Ambulatory Visit (INDEPENDENT_AMBULATORY_CARE_PROVIDER_SITE_OTHER): Payer: BC Managed Care – PPO | Admitting: Adult Health

## 2019-04-30 ENCOUNTER — Encounter: Payer: Self-pay | Admitting: Adult Health

## 2019-04-30 ENCOUNTER — Other Ambulatory Visit: Payer: Self-pay

## 2019-04-30 VITALS — BP 108/70 | HR 83 | Temp 97.7°F | Resp 16 | Ht 66.0 in | Wt 252.0 lb

## 2019-04-30 DIAGNOSIS — E1165 Type 2 diabetes mellitus with hyperglycemia: Secondary | ICD-10-CM | POA: Diagnosis not present

## 2019-04-30 DIAGNOSIS — I10 Essential (primary) hypertension: Secondary | ICD-10-CM

## 2019-04-30 DIAGNOSIS — Z23 Encounter for immunization: Secondary | ICD-10-CM

## 2019-04-30 DIAGNOSIS — R300 Dysuria: Secondary | ICD-10-CM

## 2019-04-30 DIAGNOSIS — Z0001 Encounter for general adult medical examination with abnormal findings: Secondary | ICD-10-CM

## 2019-04-30 DIAGNOSIS — Z5320 Procedure and treatment not carried out because of patient's decision for unspecified reasons: Secondary | ICD-10-CM

## 2019-04-30 DIAGNOSIS — Z5329 Procedure and treatment not carried out because of patient's decision for other reasons: Secondary | ICD-10-CM | POA: Diagnosis not present

## 2019-04-30 DIAGNOSIS — Z1231 Encounter for screening mammogram for malignant neoplasm of breast: Secondary | ICD-10-CM

## 2019-04-30 NOTE — Progress Notes (Signed)
[facility] [place], [place] [address]  Internal MEDICINE  Office Visit Note  Patient Name: Lindsey Cruz  [id number]  [id number]  Date of Service: 05/18/2019  Chief Complaint  Patient presents with  . Annual Exam  . Diabetes  . Hyperlipidemia  . Quality Metric Gaps    does pt need to get shingles vaccine if she had chicken pox      HPI Pt is here for routine health maintenance examination with no acute issues to discuss today. Blood pressure is well managed on current therapy, denies chest pain, headache, blurred vision. Seasonal allergies are well maintained on current therapy, reports no issues. Diabetes well controlled through evidence of A1C values, denies neuropathic pain, increase in thirst or urination. Discussed checking blood sugar values at home but patient states not having the supplies and being unsure of which machine she has. Plan to find machine and call so strips may be ordered as well as lancets. Requesting flu vaccine this visit. Colon cancer screening done last year, normal findings. Pap smear normal in 2019, repeat in 3 years. Reports that she has stopped taking atorvastatin as she has seen too many people in her family have negative side effects from statin medications.  Current Medication: Outpatient Encounter Medications as of 04/30/2019  Medication Sig  . fexofenadine-pseudoephedrine (ALLEGRA-D ALLERGY & CONGESTION) 180-240 MG 24 hr tablet Take 1 tablet by mouth daily.  . hydrochlorothiazide (MICROZIDE) 12.5 MG capsule TAKE 1 CAPSULE BY MOUTH  DAILY  . ibuprofen (IBU) 600 MG tablet Take by mouth.  . meloxicam (MOBIC) 15 MG tablet Take 1 tablet (15 mg total) by mouth daily.  . Multiple Vitamins-Minerals (EMERGEN-C VITAMIN D/CALCIUM PO) Take by mouth.  . nystatin ointment (MYCOSTATIN) Apply 1 application topically 3 (three) times daily.  . TRULICITY 1.5 GN/5.6OZ SOPN INJECT SUBCUTANEOUSLY 1.5MG  ONCE WEEKLY  . [DISCONTINUED]  atorvastatin (LIPITOR) 20 MG tablet Take 1 tablet (20 mg total) by mouth every evening. for cholesterol  . [DISCONTINUED] bisoprolol-hydrochlorothiazide (ZIAC) 2.5-6.25 MG tablet TAKE 1 TABLET BY MOUTH  DAILY  . fluticasone (FLONASE) 50 MCG/ACT nasal spray Place 1 spray into both nostrils 2 (two) times daily.   No facility-administered encounter medications on file as of 04/30/2019.     Surgical History: Past Surgical History:  Procedure Laterality Date  . BREAST CYST ASPIRATION Right 2014   neg  . BREAST EXCISIONAL BIOPSY Right 1983   neg  . TUBAL LIGATION      Medical History: Past Medical History:  Diagnosis Date  . Diabetes mellitus without complication ([place])   . Hypercholesteremia   . Hypertension     Family History: Family History  Problem Relation Age of Onset  . Hypertension Mother   . Hyperlipidemia Mother   . Diabetes Mother   . Diabetes Maternal Grandmother       Review of Systems  Constitutional: Negative for chills, fatigue and unexpected weight change.  HENT: Negative for congestion, rhinorrhea, sneezing and sore throat.   Eyes: Negative for photophobia, pain and redness.  Respiratory: Negative for cough, chest tightness and shortness of breath.   Cardiovascular: Negative for chest pain and palpitations.  Gastrointestinal: Negative for abdominal pain, constipation, diarrhea, nausea and vomiting.  Endocrine: Negative.   Genitourinary: Negative for dysuria and frequency.  Musculoskeletal: Negative for arthralgias, back pain, joint swelling and neck pain.  Skin: Negative for rash.  Allergic/Immunologic: Negative.   Neurological: Negative for tremors and numbness.  Hematological: Negative for adenopathy. Does not bruise/bleed easily.  Psychiatric/Behavioral: Negative for behavioral problems and sleep disturbance. The patient is not nervous/anxious.      Vital Signs: BP 108/70   Pulse 83   Temp 97.7 F (36.5 C)   Resp 16   Ht  (1.676 m)   Wt  252 lb (114.3 kg)   SpO2 98%   BMI 40.67 kg/m    Physical Exam Vitals signs and nursing note reviewed.  Constitutional:      General: She is not in acute distress.    Appearance: She is well-developed. She is not diaphoretic.  HENT:     Head: Normocephalic and atraumatic.     Mouth/Throat:     Pharynx: No oropharyngeal exudate.  Eyes:     Pupils: Pupils are equal, round, and reactive to light.  Neck:     Musculoskeletal: Normal range of motion and neck supple.     Thyroid: No thyromegaly.     Vascular: No JVD.     Trachea: No tracheal deviation.  Cardiovascular:     Rate and Rhythm: Normal rate and regular rhythm.     Heart sounds: Normal heart sounds. No murmur. No friction rub. No gallop.   Pulmonary:     Effort: Pulmonary effort is normal. No respiratory distress.     Breath sounds: Normal breath sounds. No wheezing or rales.  Chest:     Chest wall: No tenderness.  Abdominal:     Palpations: Abdomen is soft.     Tenderness: There is no abdominal tenderness. There is no guarding.  Musculoskeletal: Normal range of motion.  Lymphadenopathy:     Cervical: No cervical adenopathy.  Skin:    General: Skin is warm and dry.  Neurological:     Mental Status: She is alert and oriented to person, place, and time.     Cranial Nerves: No cranial nerve deficit.  Psychiatric:        Behavior: Behavior normal.        Thought Content: Thought content normal.        Judgment: Judgment normal.      LABS: Recent Results (from the past 2160 hour(s))  POCT HgB A1C     Status: Abnormal   Collection Time: 02/18/19  2:05 PM  Result Value Ref Range   Hemoglobin A1C 6.3 (A) 4.0 - 5.6 %   HbA1c POC (<> result, manual entry)     HbA1c, POC (prediabetic range)     HbA1c, POC (controlled diabetic range)    CBC with Differential/Platelet     Status: None   Collection Time: 04/30/19 10:31 AM  Result Value Ref Range   WBC 6.0 3.4 - 10.8 x10E3/uL   RBC 4.19 3.77 - 5.28 x10E6/uL    Hemoglobin 13.1 11.1 - 15.9 g/dL   Hematocrit 11.9 14.7 - 46.6 %   MCV 92 79 - 97 fL   MCH 31.3 26.6 - 33.0 pg   MCHC 34.0 31.5 - 35.7 g/dL   RDW 82.9 56.2 - 13.0 %   Platelets 256 150 - 450 x10E3/uL   Neutrophils 50 Not Estab. %   Lymphs 36 Not Estab. %   Monocytes 11 Not Estab. %   Eos 2 Not Estab. %   Basos 1 Not Estab. %   Neutrophils Absolute 3.0 1.4 - 7.0 x10E3/uL   Lymphocytes Absolute 2.2 0.7 - 3.1 x10E3/uL   Monocytes Absolute 0.6 0.1 - 0.9 x10E3/uL   EOS (ABSOLUTE) 0.1 0.0 - 0.4 x10E3/uL   Basophils Absolute 0.0 0.0 - 0.2 x10E3/uL  Immature Granulocytes 0 Not Estab. %   Immature Grans (Abs) 0.0 0.0 - 0.1 x10E3/uL  Comprehensive metabolic panel     Status: Abnormal   Collection Time: 04/30/19 10:31 AM  Result Value Ref Range   Glucose 109 (H) 65 - 99 mg/dL   BUN 14 6 - 24 mg/dL   Creatinine, Ser 1.610.90 0.57 - 1.00 mg/dL   GFR calc non Af Amer 74 >59 mL/min/1.73   GFR calc Af Amer 86 >59 mL/min/1.73   BUN/Creatinine Ratio 16 9 - 23   Sodium 139 134 - 144 mmol/L   Potassium 3.9 3.5 - 5.2 mmol/L   Chloride 99 96 - 106 mmol/L   CO2 25 20 - 29 mmol/L   Calcium 9.9 8.7 - 10.2 mg/dL   Total Protein 6.8 6.0 - 8.5 g/dL   Albumin 4.4 3.8 - 4.9 g/dL   Globulin, Total 2.4 1.5 - 4.5 g/dL   Albumin/Globulin Ratio 1.8 1.2 - 2.2   Bilirubin Total 0.3 0.0 - 1.2 mg/dL   Alkaline Phosphatase 68 39 - 117 IU/L   AST 17 0 - 40 IU/L   ALT 17 0 - 32 IU/L  Lipid Panel With LDL/HDL Ratio     Status: Abnormal   Collection Time: 04/30/19 10:31 AM  Result Value Ref Range   Cholesterol, Total 159 100 - 199 mg/dL   Triglycerides 096119 0 - 149 mg/dL   HDL 37 (L) >04>39 mg/dL   VLDL Cholesterol Cal 22 5 - 40 mg/dL   LDL Chol Calc (NIH) 540100 (H) 0 - 99 mg/dL   LDL/HDL Ratio 2.7 0.0 - 3.2 ratio    Comment:                                     LDL/HDL Ratio                                             Men  Women                               1/2 Avg.Risk  1.0    1.5                                    Avg.Risk  3.6    3.2                                2X Avg.Risk  6.2    5.0                                3X Avg.Risk  8.0    6.1   T4, free     Status: None   Collection Time: 04/30/19 10:31 AM  Result Value Ref Range   Free T4 1.29 0.82 - 1.77 ng/dL  TSH     Status: None   Collection Time: 04/30/19 10:31 AM  Result Value Ref Range   TSH 1.100 0.450 - 4.500 uIU/mL  UA/M w/rflx Culture, Routine     Status: Abnormal   Collection Time: 04/30/19  2:00 PM   Specimen: Urine   URINE  Result Value Ref Range   Specific Gravity, UA 1.015 1.005 - 1.030   pH, UA 6.5 5.0 - 7.5   Color, UA Yellow Yellow   Appearance Ur Clear Clear   Leukocytes,UA 2+ (A) Negative   Protein,UA Negative Negative/Trace   Glucose, UA Negative Negative   Ketones, UA Negative Negative   RBC, UA Trace (A) Negative   Bilirubin, UA Negative Negative   Urobilinogen, Ur 0.2 0.2 - 1.0 mg/dL   Nitrite, UA Negative Negative   Microscopic Examination See below:     Comment: Microscopic was indicated and was performed.   Urinalysis Reflex Comment     Comment: This specimen has reflexed to a Urine Culture.  Microscopic Examination     Status: Abnormal   Collection Time: 04/30/19  2:00 PM   URINE  Result Value Ref Range   WBC, UA 11-30 (A) 0 - 5 /hpf   RBC 0-2 0 - 2 /hpf   Epithelial Cells (non renal) >10 (A) 0 - 10 /hpf   Casts None seen None seen /lpf   Mucus, UA Present Not Estab.   Bacteria, UA Few None seen/Few  Urine Culture, Reflex     Status: None   Collection Time: 04/30/19  2:00 PM   URINE  Result Value Ref Range   Urine Culture, Routine Final report    Organism ID, Bacteria Comment     Comment: Greater than 2 organisms recovered, none predominant. Please submit another sample if clinically indicated. 50,000-100,000 colony forming units per mL     Assessment/Plan: 1. Encounter for general adult medical examination with abnormal findings Well appearing 52 year old in no acute distress. Mammogram  routine screening ordered as well as influenza vaccine given today, otherwise is up to date on PHM.  2. Type 2 diabetes mellitus with hyperglycemia, unspecified whether long term insulin use (HCC) Stable on current therapy, continue to monitor.  3. Essential hypertension Stable on current therapy, continue to monitor.  4. Refusal of statin medication by patient Extensive discussion with patient about potential health risks associated with not being on statin including but not limited to stroke and/or heart attack. Patient is adamant that she does not feel comfortable taking statin medication at this time due to many family members having a bad reaction to them in the past. Discussed other alternative ways to decrease the risk of high cholesterol such as through a healthy diet and exercise. Patient agreed to be more active with her exercise and to start being more cautious with fatty and fried foods. Will follow cholesterol panel to ensure levels are not getting out of control. Discussed other medication options that can be started in the future if needed.  5. Encounter for screening mammogram for malignant neoplasm of breast Routine screening for breast cancer. - MM DIGITAL SCREENING BILATERAL; Future  6. Dysuria - UA/M w/rflx Culture, Routine  7. Flu vaccine need - Flu Vaccine MDCK QUAD PF  General Counseling: Rahel verbalizes understanding of the findings of todays visit and agrees with plan of treatment. I have discussed any further diagnostic evaluation that may be needed or ordered today. We also reviewed her medications today. she has been encouraged to call the office with any questions or concerns that should arise related to todays visit.   Orders Placed This Encounter  Procedures  . Microscopic Examination  . Urine Culture, Reflex  . MM DIGITAL SCREENING BILATERAL  . Flu Vaccine MDCK QUAD  PF  . UA/M w/rflx Culture, Routine    No orders of the defined types were placed in  this encounter.   Time spent: 30 Minutes   This patient was seen by Blima Ledger AGNP-C in Collaboration with Dr Lyndon Code as a part of collaborative care agreement    Johnna Acosta AGNP-C Internal Medicine

## 2019-05-01 LAB — COMPREHENSIVE METABOLIC PANEL
ALT: 17 IU/L (ref 0–32)
AST: 17 IU/L (ref 0–40)
Albumin/Globulin Ratio: 1.8 (ref 1.2–2.2)
Albumin: 4.4 g/dL (ref 3.8–4.9)
Alkaline Phosphatase: 68 IU/L (ref 39–117)
BUN/Creatinine Ratio: 16 (ref 9–23)
BUN: 14 mg/dL (ref 6–24)
Bilirubin Total: 0.3 mg/dL (ref 0.0–1.2)
CO2: 25 mmol/L (ref 20–29)
Calcium: 9.9 mg/dL (ref 8.7–10.2)
Chloride: 99 mmol/L (ref 96–106)
Creatinine, Ser: 0.9 mg/dL (ref 0.57–1.00)
GFR calc Af Amer: 86 mL/min/{1.73_m2} (ref >59)
GFR calc non Af Amer: 74 mL/min/{1.73_m2} (ref >59)
Globulin, Total: 2.4 g/dL (ref 1.5–4.5)
Glucose: 109 mg/dL — ABNORMAL HIGH (ref 65–99)
Potassium: 3.9 mmol/L (ref 3.5–5.2)
Sodium: 139 mmol/L (ref 134–144)
Total Protein: 6.8 g/dL (ref 6.0–8.5)

## 2019-05-01 LAB — CBC WITH DIFFERENTIAL/PLATELET
Basophils Absolute: 0 10*3/uL (ref 0.0–0.2)
Basos: 1 %
EOS (ABSOLUTE): 0.1 10*3/uL (ref 0.0–0.4)
Eos: 2 %
Hematocrit: 38.5 % (ref 34.0–46.6)
Hemoglobin: 13.1 g/dL (ref 11.1–15.9)
Immature Grans (Abs): 0 10*3/uL (ref 0.0–0.1)
Immature Granulocytes: 0 %
Lymphocytes Absolute: 2.2 10*3/uL (ref 0.7–3.1)
Lymphs: 36 %
MCH: 31.3 pg (ref 26.6–33.0)
MCHC: 34 g/dL (ref 31.5–35.7)
MCV: 92 fL (ref 79–97)
Monocytes Absolute: 0.6 10*3/uL (ref 0.1–0.9)
Monocytes: 11 %
Neutrophils Absolute: 3 10*3/uL (ref 1.4–7.0)
Neutrophils: 50 %
Platelets: 256 10*3/uL (ref 150–450)
RBC: 4.19 x10E6/uL (ref 3.77–5.28)
RDW: 12.6 % (ref 11.7–15.4)
WBC: 6 10*3/uL (ref 3.4–10.8)

## 2019-05-01 LAB — LIPID PANEL WITH LDL/HDL RATIO
Cholesterol, Total: 159 mg/dL (ref 100–199)
HDL: 37 mg/dL — ABNORMAL LOW (ref >39)
LDL Chol Calc (NIH): 100 mg/dL — ABNORMAL HIGH (ref 0–99)
LDL/HDL Ratio: 2.7 ratio (ref 0.0–3.2)
Triglycerides: 119 mg/dL (ref 0–149)
VLDL Cholesterol Cal: 22 mg/dL (ref 5–40)

## 2019-05-01 LAB — T4, FREE: Free T4: 1.29 ng/dL (ref 0.82–1.77)

## 2019-05-01 LAB — TSH: TSH: 1.1 u[IU]/mL (ref 0.450–4.500)

## 2019-05-03 LAB — UA/M W/RFLX CULTURE, ROUTINE
Bilirubin, UA: NEGATIVE
Glucose, UA: NEGATIVE
Ketones, UA: NEGATIVE
Nitrite, UA: NEGATIVE
Protein,UA: NEGATIVE
Specific Gravity, UA: 1.015 (ref 1.005–1.030)
Urobilinogen, Ur: 0.2 mg/dL (ref 0.2–1.0)
pH, UA: 6.5 (ref 5.0–7.5)

## 2019-05-03 LAB — MICROSCOPIC EXAMINATION
Casts: NONE SEEN /lpf
Epithelial Cells (non renal): >10 /hpf — AB (ref 0–10)

## 2019-05-03 LAB — URINE CULTURE, REFLEX

## 2019-05-05 NOTE — Progress Notes (Signed)
Suspect contamination. Asymptomatic.

## 2019-05-12 ENCOUNTER — Other Ambulatory Visit: Payer: Self-pay | Admitting: Internal Medicine

## 2019-05-12 DIAGNOSIS — I10 Essential (primary) hypertension: Secondary | ICD-10-CM

## 2019-05-13 ENCOUNTER — Other Ambulatory Visit: Payer: Self-pay

## 2019-05-13 DIAGNOSIS — I10 Essential (primary) hypertension: Secondary | ICD-10-CM

## 2019-05-13 MED ORDER — BISOPROLOL-HYDROCHLOROTHIAZIDE 2.5-6.25 MG PO TABS: 1.0000 | ORAL_TABLET | Freq: Every day | ORAL | 1 refills | Status: DC

## 2019-05-20 ENCOUNTER — Telehealth: Payer: Self-pay

## 2019-05-20 NOTE — Telephone Encounter (Signed)
Confirmed appointment with patient. klh °

## 2019-05-22 ENCOUNTER — Encounter: Payer: Self-pay | Admitting: Adult Health

## 2019-05-22 ENCOUNTER — Ambulatory Visit: Payer: BC Managed Care – PPO | Admitting: Adult Health

## 2019-05-22 ENCOUNTER — Other Ambulatory Visit: Payer: Self-pay

## 2019-05-22 VITALS — BP 118/86 | HR 88 | Resp 16 | Ht 66.0 in | Wt 252.0 lb

## 2019-05-22 DIAGNOSIS — Z9189 Other specified personal risk factors, not elsewhere classified: Secondary | ICD-10-CM

## 2019-05-22 DIAGNOSIS — E1165 Type 2 diabetes mellitus with hyperglycemia: Secondary | ICD-10-CM

## 2019-05-22 DIAGNOSIS — Z6841 Body Mass Index (BMI) 40.0 and over, adult: Secondary | ICD-10-CM

## 2019-05-22 DIAGNOSIS — I10 Essential (primary) hypertension: Secondary | ICD-10-CM | POA: Diagnosis not present

## 2019-05-22 MED ORDER — ACCU-CHEK FASTCLIX LANCETS MISC: 11 refills | Status: DC

## 2019-05-22 MED ORDER — ACCU-CHEK GUIDE VI STRP: 1.0000 | ORAL_STRIP | 11 refills | Status: DC | PRN

## 2019-05-22 NOTE — Progress Notes (Signed)
[facility] [address] [place], [place] [id number]  Internal MEDICINE  Telephone Visit  Patient Name: Lindsey Cruz  [id number]  [id number]  Date of Service: 05/22/2019  I connected with the patient at [phone] by telephone and verified the patients identity using two identifiers.   I discussed the limitations, risks, security and privacy concerns of performing an evaluation and management service by telephone and the availability of in person appointments. I also discussed with the patient that there may be a patient responsible charge related to the service.  The patient expressed understanding and agrees to proceed.    Chief Complaint  Patient presents with  . Telephone Screen  . Medical Management of Chronic Issues    2 WEEK FOLLOW UP   . Telephone Assessment    HPI  Pt is seen via video. She reports her bp was a little elevated at check in because she was struggling to get her BP cuff out of the closet so she could check her bp.   Son was exposed at work to [employer].  Her son is awaiting test results at this time.  He does not currently have symptoms.  She is curious if she needs to be tested.    Current Medication: Outpatient Encounter Medications as of 05/22/2019  Medication Sig  . bisoprolol-hydrochlorothiazide (ZIAC) 2.5-6.25 MG tablet Take 1 tablet by mouth daily.  . fexofenadine-pseudoephedrine (ALLEGRA-D ALLERGY & CONGESTION) 180-240 MG 24 hr tablet Take 1 tablet by mouth daily.  . fluticasone (FLONASE) 50 MCG/ACT nasal spray Place 1 spray into both nostrils 2 (two) times daily.  . hydrochlorothiazide (MICROZIDE) 12.5 MG capsule TAKE 1 CAPSULE BY MOUTH  DAILY  . ibuprofen (IBU) 600 MG tablet Take by mouth.  . meloxicam (MOBIC) 15 MG tablet Take 1 tablet (15 mg total) by mouth daily.  . Multiple Vitamins-Minerals (EMERGEN-C VITAMIN D/CALCIUM PO) Take by mouth.  . nystatin ointment (MYCOSTATIN) Apply 1 application topically 3 (three) times daily.  . TRULICITY 1.5  MG/0.5ML SOPN INJECT SUBCUTANEOUSLY 1.5MG  ONCE WEEKLY   No facility-administered encounter medications on file as of 05/22/2019.    Surgical History: Past Surgical History:  Procedure Laterality Date  . BREAST CYST ASPIRATION Right 2014   neg  . BREAST EXCISIONAL BIOPSY Right 1983   neg  . TUBAL LIGATION      Medical History: Past Medical History:  Diagnosis Date  . Diabetes mellitus without complication (HCC)   . Hypercholesteremia   . Hypertension     Family History: Family History  Problem Relation Age of Onset  . Hypertension Mother   . Hyperlipidemia Mother   . Diabetes Mother   . Diabetes Maternal Grandmother     Social History   Socioeconomic History  . Marital status: Married    Spouse name: Not on file  . Number of children: Not on file  . Years of education: Not on file  . Highest education level: Not on file  Occupational History  . Not on file  Tobacco Use  . Smoking status: Never Smoker  . Smokeless tobacco: Never Used  Substance and Sexual Activity  . Alcohol use: Yes    Comment: occasional  . Drug use: Never  . Sexual activity: Not on file  Other Topics Concern  . Not on file  Social History Narrative  . Not on file   Social Determinants of Health   Financial Resource Strain:   . Difficulty of Paying Living Expenses: Not on file  Food Insecurity:   .  Worried About Charity fundraiser in the Last Year: Not on file  . Ran Out of Food in the Last Year: Not on file  Transportation Needs:   . Lack of Transportation (Medical): Not on file  . Lack of Transportation (Non-Medical): Not on file  Physical Activity:   . Days of Exercise per Week: Not on file  . Minutes of Exercise per Session: Not on file  Stress:   . Feeling of Stress : Not on file  Social Connections:   . Frequency of Communication with Friends and Family: Not on file  . Frequency of Social Gatherings with Friends and Family: Not on file  . Attends Religious Services: Not  on file  . Active Member of Clubs or Organizations: Not on file  . Attends Archivist Meetings: Not on file  . Marital Status: Not on file  Intimate Partner Violence:   . Fear of Current or Ex-Partner: Not on file  . Emotionally Abused: Not on file  . Physically Abused: Not on file  . Sexually Abused: Not on file      Review of Systems  Constitutional: Negative for chills, fatigue and unexpected weight change.  HENT: Negative for congestion, rhinorrhea, sneezing and sore throat.   Eyes: Negative for photophobia, pain and redness.  Respiratory: Negative for cough, chest tightness and shortness of breath.   Cardiovascular: Negative for chest pain and palpitations.  Gastrointestinal: Negative for abdominal pain, constipation, diarrhea, nausea and vomiting.  Endocrine: Negative.   Genitourinary: Negative for dysuria and frequency.  Musculoskeletal: Negative for arthralgias, back pain, joint swelling and neck pain.  Skin: Negative for rash.  Allergic/Immunologic: Negative.   Neurological: Negative for tremors and numbness.  Hematological: Negative for adenopathy. Does not bruise/bleed easily.  Psychiatric/Behavioral: Negative for behavioral problems and sleep disturbance. The patient is not nervous/anxious.     Vital Signs: BP 118/86   Pulse 88   Resp 16   Ht 5\' 6"  (1.676 m)   Wt 252 lb (114.3 kg)   BMI 40.67 kg/m    Observation/Objective:  Well appearing nad noted.    Assessment/Plan: 1. Essential hypertension Repeat BP's were WNL. Will continue current regiment. Will see patient in office in 6 weeks for recheck.    2. Type 2 diabetes mellitus with hyperglycemia, unspecified whether long term insulin use ([place]) Most recent A1C was 6.3, pt is due for recheck.  Instructed her to follow up in office in 6 weeks for recheck.   3. Class 3 severe obesity due to excess calories with serious comorbidity and body mass index (BMI) of 40.0 to 44.9 in adult [facility]) Obesity  Counseling: Risk Assessment: An assessment of behavioral risk factors was made today and includes lack of exercise sedentary lifestyle, lack of portion control and poor dietary habits.  Risk Modification Advice: She was counseled on portion control guidelines. Restricting daily caloric intake to. . The detrimental long term effects of obesity on her health and ongoing poor compliance was also discussed with the patient.  4. At increased risk of exposure to COVID-19 virus Encouraged patient to get tested in coming days if symptoms present.   General Counseling: wilmarie sparlin understanding of the findings of today's phone visit and agrees with plan of treatment. I have discussed any further diagnostic evaluation that may be needed or ordered today. We also reviewed her medications today. she has been encouraged to call the office with any questions or concerns that should arise related to todays visit.  No orders of the defined types were placed in this encounter.   No orders of the defined types were placed in this encounter.   Time spent: 15 Minutes    Blima Ledger AGNP-C Internal medicine

## 2019-05-28 ENCOUNTER — Other Ambulatory Visit: Payer: Self-pay | Admitting: Internal Medicine

## 2019-06-10 ENCOUNTER — Other Ambulatory Visit: Payer: Self-pay

## 2019-06-10 MED ORDER — ACCU-CHEK GUIDE VI STRP: ORAL_STRIP | 11 refills | Status: DC

## 2019-07-01 ENCOUNTER — Telehealth: Payer: Self-pay

## 2019-07-01 NOTE — Telephone Encounter (Signed)
Confirmed virtual visit with patient. klh 

## 2019-07-03 ENCOUNTER — Ambulatory Visit: Payer: BC Managed Care – PPO | Admitting: Adult Health

## 2019-07-03 ENCOUNTER — Encounter: Payer: Self-pay | Admitting: Adult Health

## 2019-07-03 VITALS — BP 130/68 | HR 94 | Temp 98.2°F | Ht 66.0 in

## 2019-07-03 DIAGNOSIS — E1165 Type 2 diabetes mellitus with hyperglycemia: Secondary | ICD-10-CM | POA: Diagnosis not present

## 2019-07-03 DIAGNOSIS — Z6841 Body Mass Index (BMI) 40.0 and over, adult: Secondary | ICD-10-CM

## 2019-07-03 DIAGNOSIS — I10 Essential (primary) hypertension: Secondary | ICD-10-CM | POA: Diagnosis not present

## 2019-07-03 DIAGNOSIS — K219 Gastro-esophageal reflux disease without esophagitis: Secondary | ICD-10-CM

## 2019-07-03 NOTE — Progress Notes (Signed)
[facility] [address] [place], [place] [address]  Internal MEDICINE  Telephone Visit  Patient Name: Lindsey Cruz  [id number]  [id number]  Date of Service: 07/03/2019  I connected with the patient at 951 by telephone and verified the patients identity using two identifiers.   I discussed the limitations, risks, security and privacy concerns of performing an evaluation and management service by telephone and the availability of in person appointments. I also discussed with the patient that there may be a patient responsible charge related to the service.  The patient expressed understanding and agrees to proceed.    Chief Complaint  Patient presents with  . Telephone Assessment  . Telephone Screen  . Hypertension  . Diabetes    BS 116 fasting  . Gastroesophageal Reflux    still has omeprazole only takes as needed, more foods are giving her acid reflux    HPI  PT seen via video.  She reports overall she is doing well. Her DM is doing well.  Her recnet AM blood sugars have been [phone].  Her blood pressure has been well controlled.  She Denies Chest pain, Shortness of breath, palpitations, headache, or blurred vision. Her GERD is well controlled at this time with diet. She is taking omeprazole as needed and doing well.    Current Medication: Outpatient Encounter Medications as of 07/03/2019  Medication Sig  . Accu-Chek FastClix Lancets MISC Use as directed dx e11.65  . bisoprolol-hydrochlorothiazide (ZIAC) 2.5-6.25 MG tablet Take 1 tablet by mouth daily.  . fexofenadine-pseudoephedrine (ALLEGRA-D ALLERGY & CONGESTION) 180-240 MG 24 hr tablet Take 1 tablet by mouth daily.  Marland Kitchen glucose blood (ACCU-CHEK GUIDE) test strip Use as instructed once a daily dx e11.65  . hydrochlorothiazide (MICROZIDE) 12.5 MG capsule TAKE 1 CAPSULE BY MOUTH  DAILY  . ibuprofen (IBU) 600 MG tablet Take by mouth.  . meloxicam (MOBIC) 15 MG tablet Take 1 tablet (15 mg total) by mouth daily.   . Multiple Vitamins-Minerals (EMERGEN-C VITAMIN D/CALCIUM PO) Take by mouth.  . nystatin ointment (MYCOSTATIN) Apply 1 application topically 3 (three) times daily.  . TRULICITY 1.5 MG/0.5ML SOPN INJECT SUBCUTANEOUSLY 1.5MG  ONCE WEEKLY  . fluticasone (FLONASE) 50 MCG/ACT nasal spray Place 1 spray into both nostrils 2 (two) times daily.   No facility-administered encounter medications on file as of 07/03/2019.    Surgical History: Past Surgical History:  Procedure Laterality Date  . BREAST CYST ASPIRATION Right 2014   neg  . BREAST EXCISIONAL BIOPSY Right 1983   neg  . TUBAL LIGATION      Medical History: Past Medical History:  Diagnosis Date  . Diabetes mellitus without complication (HCC)   . Hypercholesteremia   . Hypertension     Family History: Family History  Problem Relation Age of Onset  . Hypertension Mother   . Hyperlipidemia Mother   . Diabetes Mother   . Diabetes Maternal Grandmother     Social History   Socioeconomic History  . Marital status: Married    Spouse name: Not on file  . Number of children: Not on file  . Years of education: Not on file  . Highest education level: Not on file  Occupational History  . Not on file  Tobacco Use  . Smoking status: Never Smoker  . Smokeless tobacco: Never Used  Substance and Sexual Activity  . Alcohol use: Yes    Comment: occasional  . Drug use: Never  . Sexual activity: Not on file  Other Topics Concern  .  Not on file  Social History Narrative  . Not on file   Social Determinants of Health   Financial Resource Strain:   . Difficulty of Paying Living Expenses: Not on file  Food Insecurity:   . Worried About Programme researcher, broadcasting/film/video in the Last Year: Not on file  . Ran Out of Food in the Last Year: Not on file  Transportation Needs:   . Lack of Transportation (Medical): Not on file  . Lack of Transportation (Non-Medical): Not on file  Physical Activity:   . Days of Exercise per Week: Not on file  .  Minutes of Exercise per Session: Not on file  Stress:   . Feeling of Stress : Not on file  Social Connections:   . Frequency of Communication with Friends and Family: Not on file  . Frequency of Social Gatherings with Friends and Family: Not on file  . Attends Religious Services: Not on file  . Active Member of Clubs or Organizations: Not on file  . Attends Banker Meetings: Not on file  . Marital Status: Not on file  Intimate Partner Violence:   . Fear of Current or Ex-Partner: Not on file  . Emotionally Abused: Not on file  . Physically Abused: Not on file  . Sexually Abused: Not on file      Review of Systems  Constitutional: Negative for chills, fatigue and unexpected weight change.  HENT: Negative for congestion, rhinorrhea, sneezing and sore throat.   Eyes: Negative for photophobia, pain and redness.  Respiratory: Negative for cough, chest tightness and shortness of breath.   Cardiovascular: Negative for chest pain and palpitations.  Gastrointestinal: Negative for abdominal pain, constipation, diarrhea, nausea and vomiting.  Endocrine: Negative.   Genitourinary: Negative for dysuria and frequency.  Musculoskeletal: Negative for arthralgias, back pain, joint swelling and neck pain.  Skin: Negative for rash.  Allergic/Immunologic: Negative.   Neurological: Negative for tremors and numbness.  Hematological: Negative for adenopathy. Does not bruise/bleed easily.  Psychiatric/Behavioral: Negative for behavioral problems and sleep disturbance. The patient is not nervous/anxious.     Vital Signs: BP 130/68   Pulse 94   Temp 98.2 F (36.8 C)   Ht 5\' 6"  (1.676 m)   BMI 40.67 kg/m    Observation/Objective:  Well appearing, NAD noted.   Assessment/Plan: 1. Essential hypertension Stable, continue current therapy.   2. Type 2 diabetes mellitus with hyperglycemia, unspecified whether long term insulin use (HCC) Stable at this time.  Continue to use  medications as discussed, and monitor blood sugar.  Return to office in one month for follow up A1C check.   3. Gastroesophageal reflux disease without esophagitis Stable, continue current management.  4. Class 3 severe obesity due to excess calories with serious comorbidity and body mass index (BMI) of 40.0 to 44.9 in adult [facility]) Obesity Counseling: Risk Assessment: An assessment of behavioral risk factors was made today and includes lack of exercise sedentary lifestyle, lack of portion control and poor dietary habits.  Risk Modification Advice: She was counseled on portion control guidelines. Restricting daily caloric intake to 1800. The detrimental long term effects of obesity on her health and ongoing poor compliance was also discussed with the patient.    General Counseling: yianna tersigni understanding of the findings of today's phone visit and agrees with plan of treatment. I have discussed any further diagnostic evaluation that may be needed or ordered today. We also reviewed her medications today. she has been encouraged to call the  office with any questions or concerns that should arise related to todays visit.    No orders of the defined types were placed in this encounter.   No orders of the defined types were placed in this encounter.   Time spent: [facility] Internal medicine

## 2019-07-05 ENCOUNTER — Other Ambulatory Visit: Payer: Self-pay

## 2019-07-05 DIAGNOSIS — Z20822 Contact with and (suspected) exposure to covid-19: Secondary | ICD-10-CM | POA: Diagnosis not present

## 2019-07-06 LAB — NOVEL CORONAVIRUS, NAA: SARS-CoV-2, NAA: NOT DETECTED

## 2019-07-15 DIAGNOSIS — T7840XA Allergy, unspecified, initial encounter: Secondary | ICD-10-CM | POA: Diagnosis not present

## 2019-07-27 DIAGNOSIS — G43909 Migraine, unspecified, not intractable, without status migrainosus: Secondary | ICD-10-CM | POA: Diagnosis not present

## 2019-07-29 ENCOUNTER — Telehealth: Payer: Self-pay

## 2019-07-29 NOTE — Telephone Encounter (Signed)
Confirmed appointment on 07/31/2019 and screened for covid. kl[facility] 

## 2019-07-30 ENCOUNTER — Other Ambulatory Visit: Payer: Self-pay

## 2019-07-30 ENCOUNTER — Encounter: Payer: Self-pay | Admitting: Emergency Medicine

## 2019-07-30 ENCOUNTER — Ambulatory Visit
Admission: EM | Admit: 2019-07-30 | Discharge: 2019-07-30 | Disposition: A | Discharge status: Home / Self Care | Payer: BC Managed Care – PPO | Attending: Family Medicine | Admitting: Family Medicine

## 2019-07-30 DIAGNOSIS — Z20822 Contact with and (suspected) exposure to covid-19: Secondary | ICD-10-CM | POA: Insufficient documentation

## 2019-07-30 DIAGNOSIS — I10 Essential (primary) hypertension: Secondary | ICD-10-CM | POA: Insufficient documentation

## 2019-07-30 DIAGNOSIS — R519 Headache, unspecified: Secondary | ICD-10-CM | POA: Insufficient documentation

## 2019-07-30 DIAGNOSIS — Z833 Family history of diabetes mellitus: Secondary | ICD-10-CM | POA: Diagnosis not present

## 2019-07-30 DIAGNOSIS — Z79899 Other long term (current) drug therapy: Secondary | ICD-10-CM | POA: Diagnosis not present

## 2019-07-30 DIAGNOSIS — J3489 Other specified disorders of nose and nasal sinuses: Secondary | ICD-10-CM | POA: Diagnosis not present

## 2019-07-30 DIAGNOSIS — Z8249 Family history of ischemic heart disease and other diseases of the circulatory system: Secondary | ICD-10-CM | POA: Insufficient documentation

## 2019-07-30 DIAGNOSIS — E119 Type 2 diabetes mellitus without complications: Secondary | ICD-10-CM | POA: Insufficient documentation

## 2019-07-30 DIAGNOSIS — E7800 Pure hypercholesterolemia, unspecified: Secondary | ICD-10-CM | POA: Diagnosis not present

## 2019-07-30 DIAGNOSIS — Z791 Long term (current) use of non-steroidal anti-inflammatories (NSAID): Secondary | ICD-10-CM | POA: Diagnosis not present

## 2019-07-30 MED ORDER — ONDANSETRON 8 MG PO TBDP
8.0000 mg | ORAL_TABLET | Freq: Once | ORAL | Status: AC
  Administered 2019-07-30: 8 mg via ORAL

## 2019-07-30 MED ORDER — DEXAMETHASONE SODIUM PHOSPHATE 10 MG/ML IJ SOLN
10.0000 mg | Freq: Once | INTRAMUSCULAR | Status: AC
  Administered 2019-07-30: 10 mg via INTRAMUSCULAR

## 2019-07-30 MED ORDER — IBUPROFEN 800 MG PO TABS
800.0000 mg | ORAL_TABLET | Freq: Once | ORAL | Status: AC
  Administered 2019-07-30: 800 mg via ORAL

## 2019-07-30 NOTE — ED Provider Notes (Signed)
[facility]    CSN: [id number] Arrival date & time: 07/30/19  1848      History   Chief Complaint Chief Complaint  Patient presents with  . Headache  . Facial Pain    HPI Lindsey Cruz is a 54 y.o. female.   Lindsey Cruz presents with complaints of headache and sinus pressure. Feels like a "helmet" of pressure around ears and top of head. Head is painful to touch even. Ears feel full or muffled. Squinting her eyes is uncomfortable to her forehead even. Teeth are painful. Sinus pressure to frontal sinuses. Laying down at night worsens the pain but repositioning does improve pain, doesn't have to sit up. Pain was worse on 2/13 night, when it started. Took coricidin as well as tylenol, which didn't help.  Typically these remedies help with sinus symptoms for her. 2/14 pain was worse still, with light sensitivity as well. Called [facility], and was given imitrex which she took two of on 2/14. This did not help at all. History of similar headaches in the past. Once was very severe, years ago, and was preceded by vertigo. Denies any associated vertigo with current episode. She does experience occasional hormonal headache, current headache is typical time frame for her with her cycle. Tends to get headache once her bleeding subsides. No nausea or vomiting. No vision changes. No runny nose or sore throat. Ears are painful but feel full. No known ill contacts. Works from home.     ROS per HPI, negative if not otherwise mentioned.      Past Medical History:  Diagnosis Date  . Diabetes mellitus without complication (HCC)   . Hypercholesteremia   . Hypertension     Patient Active Problem List   Diagnosis Date Noted  . Vaginal candidiasis 09/22/2018  . Diabetes mellitus without complication (HCC) 02/27/2018  . Essential hypertension 02/27/2018    Past Surgical History:  Procedure Laterality Date  . BREAST CYST ASPIRATION Right 2014   neg  . BREAST EXCISIONAL BIOPSY Right  1983   neg  . TUBAL LIGATION      OB History   No obstetric history on file.      Home Medications    Prior to Admission medications   Medication Sig Start Date End Date Taking? Authorizing Provider  Accu-Chek FastClix Lancets MISC Use as directed dx e11.65 05/22/19  Yes Scarboro, Coralee North, NP  atorvastatin (LIPITOR) 20 MG tablet Take 20 mg by mouth daily.   Yes [provider]  bisoprolol-hydrochlorothiazide (ZIAC) 2.5-6.25 MG tablet Take 1 tablet by mouth daily. 05/13/19  Yes Lyndon Code, MD  glucose blood (ACCU-CHEK GUIDE) test strip Use as instructed once a daily dx e11.65 06/10/19  Yes Boscia, Kathlynn Grate, NP  hydrochlorothiazide (MICROZIDE) 12.5 MG capsule TAKE 1 CAPSULE BY MOUTH  DAILY 09/23/18  Yes Scarboro, Coralee North, NP  Multiple Vitamins-Minerals (EMERGEN-C VITAMIN D/CALCIUM PO) Take by mouth.   Yes [provider]  TRULICITY 1.5 MG/0.5ML SOPN INJECT SUBCUTANEOUSLY 1.5MG  ONCE WEEKLY 03/07/19  Yes Scarboro, Coralee North, NP  fexofenadine-pseudoephedrine (ALLEGRA-D ALLERGY & CONGESTION) 180-240 MG 24 hr tablet Take 1 tablet by mouth daily. 01/16/16   Hassan Rowan, MD  fluticasone (FLONASE) 50 MCG/ACT nasal spray Place 1 spray into both nostrils 2 (two) times daily. 06/30/15 10/03/18  Betancourt, Jarold Song, NP  ibuprofen (IBU) 600 MG tablet Take by mouth. 12/25/16   [provider]  meloxicam (MOBIC) 15 MG tablet Take 1 tablet (15 mg total) by  mouth daily. 10/03/18   Hyatt, Max T, DPM  nystatin ointment (MYCOSTATIN) Apply 1 application topically 3 (three) times daily. 09/19/18   Ronnell Freshwater, NP  SUMAtriptan (IMITREX) 100 MG tablet TAKE 1 TABLET BY MOUTH EVERY 2 HOURS. DO NOT EXCEED 2 DOSES PER 24 HOURS 07/27/19   [provider]    Family History Family History  Problem Relation Age of Onset  . Hypertension Mother   . Hyperlipidemia Mother   . Diabetes Mother   . Diabetes Maternal Grandmother     Social History Social History   Tobacco Use  . Smoking  status: Never Smoker  . Smokeless tobacco: Never Used  Substance Use Topics  . Alcohol use: Yes    Comment: occasional  . Drug use: Never     Allergies   Patient has no known allergies.   Review of Systems Review of Systems   Physical Exam Triage Vital Signs ED Triage Vitals  Enc Vitals Group     BP 07/30/19 1910 121/61     Pulse Rate 07/30/19 1910 71     Resp 07/30/19 1910 18     Temp 07/30/19 1910 98.4 F (36.9 C)     Temp Source 07/30/19 1910 Oral     SpO2 07/30/19 1910 98 %     Weight 07/30/19 1907 252 lb (114.3 kg)     Height 07/30/19 1907 5\' 6"  (1.676 m)     Head Circumference --      Peak Flow --      Pain Score 07/30/19 1907 8     Pain Loc --      Pain Edu? --      Excl. in [place]? --    No data found.  Updated Vital Signs BP 121/61 (BP Location: Right Arm)   Pulse 71   Temp 98.4 F (36.9 C) (Oral)   Resp 18   Ht 5\' 6"  (1.676 m)   Wt 252 lb (114.3 kg)   LMP 07/23/2019   SpO2 98%   BMI 40.67 kg/m    Physical Exam Constitutional:      General: She is not in acute distress.    Appearance: She is well-developed.  HENT:     Head: Normocephalic and atraumatic.     Right Ear: Hearing, tympanic membrane and ear canal normal.     Left Ear: Hearing, tympanic membrane and ear canal normal.     Nose: Nose normal.     Right Sinus: No maxillary sinus tenderness or frontal sinus tenderness.     Left Sinus: No maxillary sinus tenderness or frontal sinus tenderness.     Mouth/Throat:     Lips: Pink.     Mouth: Mucous membranes are moist.     Pharynx: Oropharynx is clear.  Eyes:     Extraocular Movements: Extraocular movements intact.     Pupils: Pupils are equal, round, and reactive to light.  Cardiovascular:     Rate and Rhythm: Normal rate.  Pulmonary:     Effort: Pulmonary effort is normal.  Skin:    General: Skin is warm and dry.  Neurological:     Mental Status: She is alert and oriented to person, place, and time. Mental status is at baseline.      Cranial Nerves: No cranial nerve deficit or facial asymmetry.     Sensory: No sensory deficit.     Motor: No weakness.  Psychiatric:        Mood and Affect: Mood normal.  Speech: Speech normal.      UC Treatments / Results  Labs (all labs ordered are listed, but only abnormal results are displayed) Labs Reviewed  NOVEL CORONAVIRUS, NAA (HOSP ORDER, SEND-OUT TO REF LAB; TAT 18-24 HRS)    EKG   Radiology No results found.  Procedures Procedures (including critical care time)  Medications Ordered in UC Medications  dexamethasone (DECADRON) injection 10 mg (10 mg Intramuscular Given 07/30/19 1937)  ondansetron (ZOFRAN-ODT) disintegrating tablet 8 mg (8 mg Oral Given 07/30/19 1937)  ibuprofen (ADVIL) tablet 800 mg (800 mg Oral Given 07/30/19 1937)    Initial Impression / Assessment and Plan / UC Course  I have reviewed the triage vital signs and the nursing notes.  Pertinent labs & imaging results that were available during my care of the patient were reviewed by me and considered in my medical decision making (see chart for details).     No red flag neurological findings. Symptoms of headache for the past 4 days which didn't improve with imitrex, although symptoms have improved some. Has had similar headaches in the past. No obvious sinusitis at this time. Opted to treat with decadron, motrin and zofran here tonight to try to abort headache. covid testing also collected and pending. Return precautions provided. Patient verbalized understanding and agreeable to plan.   Final Clinical Impressions(s) / UC Diagnoses   Final diagnoses:  Bad headache  Sinus pressure     Discharge Instructions     We are trying tonight to treat migraine headache.  May take Benadryl as well to promote sleep and help with headache.  Go home, rest in quiet dark room. Limit screen time.  Drink plenty of water to ensure adequate hydration.   May use tylenol as needed for lingering  headache.  Don't take any further headache until tomorrow.  This still may be related to your sinuses, which I am hopeful the injection we have given you tonight will still help with. Otherwise continue with plenty of hydration, iburpofen, nasal saline to promote drainage.  Covid testing will result in the next 2-3 days. We call if positive and negatives sent through your mychart.  If symptoms worsen or do not improve in the next week to return to be seen or to follow up with your PCP.      ED Prescriptions    None     PDMP not reviewed this encounter.   Georgetta Haber, NP 07/30/19 1953

## 2019-07-30 NOTE — Discharge Instructions (Signed)
We are trying tonight to treat migraine headache.  May take Benadryl as well to promote sleep and help with headache.  Go home, rest in quiet dark room. Limit screen time.  Drink plenty of water to ensure adequate hydration.   May use tylenol as needed for lingering headache.  Don't take any further headache until tomorrow.  This still may be related to your sinuses, which I am hopeful the injection we have given you tonight will still help with. Otherwise continue with plenty of hydration, iburpofen, nasal saline to promote drainage.  Covid testing will result in the next 2-3 days. We call if positive and negatives sent through your mychart.  If symptoms worsen or do not improve in the next week to return to be seen or to follow up with your PCP.

## 2019-07-30 NOTE — ED Triage Notes (Signed)
Patient c/o headache that started on Saturday. She received Imitrex from her provider. She took both of them on Sunday with no relief. She was advised to come to urgent care. She also is c/o sinus pain and pressure that began on Saturday.

## 2019-07-31 ENCOUNTER — Encounter: Payer: Self-pay | Admitting: Adult Health

## 2019-07-31 ENCOUNTER — Ambulatory Visit (INDEPENDENT_AMBULATORY_CARE_PROVIDER_SITE_OTHER): Payer: BC Managed Care – PPO | Admitting: Adult Health

## 2019-07-31 VITALS — BP 126/88 | HR 104 | Temp 98.7°F | Ht 66.0 in

## 2019-07-31 DIAGNOSIS — G43001 Migraine without aura, not intractable, with status migrainosus: Secondary | ICD-10-CM

## 2019-07-31 DIAGNOSIS — E1165 Type 2 diabetes mellitus with hyperglycemia: Secondary | ICD-10-CM | POA: Diagnosis not present

## 2019-07-31 DIAGNOSIS — L299 Pruritus, unspecified: Secondary | ICD-10-CM

## 2019-07-31 DIAGNOSIS — I10 Essential (primary) hypertension: Secondary | ICD-10-CM | POA: Diagnosis not present

## 2019-07-31 DIAGNOSIS — K219 Gastro-esophageal reflux disease without esophagitis: Secondary | ICD-10-CM

## 2019-07-31 MED ORDER — TRULICITY 1.5 MG/0.5ML ~~LOC~~ SOAJ: SUBCUTANEOUS | 0 refills | Status: DC

## 2019-07-31 NOTE — Progress Notes (Signed)
[facility] [address] [place], [place] [address]  Internal MEDICINE  Telephone Visit  Patient Name: Lindsey Cruz  [id number]  [id number]  Date of Service: 07/31/2019  I connected with the patient at 1034 by telephone and verified the patients identity using two identifiers.   I discussed the limitations, risks, security and privacy concerns of performing an evaluation and management service by telephone and the availability of in person appointments. I also discussed with the patient that there may be a patient responsible charge related to the service.  The patient expressed understanding and agrees to proceed.    Chief Complaint  Patient presents with  . Telephone Screen  . Telephone Assessment  . Diabetes    224   . Hyperlipidemia  . Hypertension    HPI  PT seen via video.  She is following up on HTN, HLD and DM.Her blood pressure has been 127/75, 122/79, 123/78, 126/88 today. She has been taking her medication.   She has been out of trulicity injects 2 weeks ago. Her blood sugar was 224 this morning.      Saturday head pain, thought it was migraine. She took tylenol, and coricidin. Neither helped.  She called the MD line, and she was given sumatriptan. It helped, but did not make it go away. It lingered for the next few days. She then went to [facility] urgent care. She complained to them of teeth hurting, head/sinus pain.  She denied drainage.  They gave her a steroid shot, and a dose of Ibuprofen and zofran. She denies any fever. She was advised to take a dose of benadryl.  Today, she is feeling better.   She has been out of trulicity injects 2 weeks ago. Her blood sugar was 224 this morning.    She also reports after eating some different food, she had mouth itching, and all over body itching. She would like to be tested for food allergies.   Current Medication: Outpatient Encounter Medications as of 07/31/2019  Medication Sig  . Accu-Chek FastClix Lancets MISC  Use as directed dx e11.65  . atorvastatin (LIPITOR) 20 MG tablet Take 20 mg by mouth daily.  . bisoprolol-hydrochlorothiazide (ZIAC) 2.5-6.25 MG tablet Take 1 tablet by mouth daily.  . Dulaglutide (TRULICITY) 1.5 MG/0.5ML SOPN INJECT SUBCUTANEOUSLY 1.5MG  ONCE WEEKLY  . fexofenadine-pseudoephedrine (ALLEGRA-D ALLERGY & CONGESTION) 180-240 MG 24 hr tablet Take 1 tablet by mouth daily.  Marland Kitchen glucose blood (ACCU-CHEK GUIDE) test strip Use as instructed once a daily dx e11.65  . hydrochlorothiazide (MICROZIDE) 12.5 MG capsule TAKE 1 CAPSULE BY MOUTH  DAILY  . ibuprofen (IBU) 600 MG tablet Take by mouth.  . meloxicam (MOBIC) 15 MG tablet Take 1 tablet (15 mg total) by mouth daily.  . Multiple Vitamins-Minerals (EMERGEN-C VITAMIN D/CALCIUM PO) Take by mouth.  . nystatin ointment (MYCOSTATIN) Apply 1 application topically 3 (three) times daily.  . SUMAtriptan (IMITREX) 100 MG tablet TAKE 1 TABLET BY MOUTH EVERY 2 HOURS. DO NOT EXCEED 2 DOSES PER 24 HOURS  . [DISCONTINUED] TRULICITY 1.5 MG/0.5ML SOPN INJECT SUBCUTANEOUSLY 1.5MG  ONCE WEEKLY  . fluticasone (FLONASE) 50 MCG/ACT nasal spray Place 1 spray into both nostrils 2 (two) times daily.   No facility-administered encounter medications on file as of 07/31/2019.    Surgical History: Past Surgical History:  Procedure Laterality Date  . BREAST CYST ASPIRATION Right 2014   neg  . BREAST EXCISIONAL BIOPSY Right 1983   neg  . TUBAL LIGATION  Medical History: Past Medical History:  Diagnosis Date  . Diabetes mellitus without complication ([place])   . Hypercholesteremia   . Hypertension     Family History: Family History  Problem Relation Age of Onset  . Hypertension Mother   . Hyperlipidemia Mother   . Diabetes Mother   . Diabetes Maternal Grandmother     Social History   Socioeconomic History  . Marital status: Married    Spouse name: Not on file  . Number of children: Not on file  . Years of education: Not on file  . Highest  education level: Not on file  Occupational History  . Not on file  Tobacco Use  . Smoking status: Never Smoker  . Smokeless tobacco: Never Used  Substance and Sexual Activity  . Alcohol use: Yes    Comment: occasional  . Drug use: Never  . Sexual activity: Not on file  Other Topics Concern  . Not on file  Social History Narrative  . Not on file   Social Determinants of Health   Financial Resource Strain:   . Difficulty of Paying Living Expenses: Not on file  Food Insecurity:   . Worried About Charity fundraiser in the Last Year: Not on file  . Ran Out of Food in the Last Year: Not on file  Transportation Needs:   . Lack of Transportation (Medical): Not on file  . Lack of Transportation (Non-Medical): Not on file  Physical Activity:   . Days of Exercise per Week: Not on file  . Minutes of Exercise per Session: Not on file  Stress:   . Feeling of Stress : Not on file  Social Connections:   . Frequency of Communication with Friends and Family: Not on file  . Frequency of Social Gatherings with Friends and Family: Not on file  . Attends Religious Services: Not on file  . Active Member of Clubs or Organizations: Not on file  . Attends Archivist Meetings: Not on file  . Marital Status: Not on file  Intimate Partner Violence:   . Fear of Current or Ex-Partner: Not on file  . Emotionally Abused: Not on file  . Physically Abused: Not on file  . Sexually Abused: Not on file      Review of Systems  Constitutional: Negative for chills, fatigue and unexpected weight change.  HENT: Negative for congestion, rhinorrhea, sneezing and sore throat.   Eyes: Negative for photophobia, pain and redness.  Respiratory: Negative for cough, chest tightness and shortness of breath.   Cardiovascular: Negative for chest pain and palpitations.  Gastrointestinal: Negative for abdominal pain, constipation, diarrhea, nausea and vomiting.  Endocrine: Negative.   Genitourinary:  Negative for dysuria and frequency.  Musculoskeletal: Negative for arthralgias, back pain, joint swelling and neck pain.  Skin: Negative for rash.  Allergic/Immunologic: Negative.   Neurological: Negative for tremors and numbness.  Hematological: Negative for adenopathy. Does not bruise/bleed easily.  Psychiatric/Behavioral: Negative for behavioral problems and sleep disturbance. The patient is not nervous/anxious.     Vital Signs: BP 126/88   Pulse (!) 104   Temp 98.7 F (37.1 C)   Ht 5\' 6"  (1.676 m)   LMP 07/23/2019   SpO2 97%   BMI 40.67 kg/m    Observation/Objective:  Well appearing, NAD noted.    Assessment/Plan: 1. Uncontrolled type 2 diabetes mellitus with hyperglycemia ([place]) Fill trulicity at local pharmacy while waiting for mail order. Follow up in office in 6 weeks for A1C check.  -  Dulaglutide (TRULICITY) 1.5 MG/0.5ML SOPN; INJECT SUBCUTANEOUSLY 1.5MG  ONCE WEEKLY  Dispense: 6 mL; Refill: 0  2. Itching Will get allergy blood work per patient request. - Allergy Panel 18, [employer] - Food Allergy Profile  3. Migraine without aura and with status migrainosus, not intractable Resolved currently, continue to monitor.   4. Essential hypertension Stable, continue to take medications as prescribed.   5. Gastroesophageal reflux disease without esophagitis Controlled.   General Counseling: dezarae mcclaran understanding of the findings of today's phone visit and agrees with plan of treatment. I have discussed any further diagnostic evaluation that may be needed or ordered today. We also reviewed her medications today. she has been encouraged to call the office with any questions or concerns that should arise related to todays visit.    Orders Placed This Encounter  Procedures  . Allergy Panel 18, [employer]  . Food Allergy Profile    Meds ordered this encounter  Medications  . Dulaglutide (TRULICITY) 1.5 MG/0.5ML SOPN    Sig: INJECT SUBCUTANEOUSLY  1.5MG  ONCE WEEKLY    Dispense:  6 mL    Refill:  0    She will need this retail once, because she can not wait for mail order.  She has been without for two weeks already.    Time spent: 25 Minutes    Blima Ledger [facility] Internal medicine

## 2019-08-01 LAB — NOVEL CORONAVIRUS, NAA (HOSP ORDER, SEND-OUT TO REF LAB; TAT 18-24 HRS): SARS-CoV-2, NAA: NOT DETECTED

## 2019-08-02 ENCOUNTER — Encounter: Payer: Self-pay | Admitting: Emergency Medicine

## 2019-08-02 ENCOUNTER — Emergency Department
Admission: EM | Admit: 2019-08-02 | Discharge: 2019-08-03 | Disposition: A | Discharge status: Home / Self Care | Payer: BC Managed Care – PPO | Attending: Emergency Medicine | Admitting: Emergency Medicine

## 2019-08-02 ENCOUNTER — Other Ambulatory Visit: Payer: Self-pay

## 2019-08-02 DIAGNOSIS — R531 Weakness: Secondary | ICD-10-CM | POA: Insufficient documentation

## 2019-08-02 DIAGNOSIS — R519 Headache, unspecified: Secondary | ICD-10-CM

## 2019-08-02 DIAGNOSIS — Z3202 Encounter for pregnancy test, result negative: Secondary | ICD-10-CM | POA: Insufficient documentation

## 2019-08-02 DIAGNOSIS — Z79899 Other long term (current) drug therapy: Secondary | ICD-10-CM | POA: Insufficient documentation

## 2019-08-02 DIAGNOSIS — E119 Type 2 diabetes mellitus without complications: Secondary | ICD-10-CM | POA: Diagnosis not present

## 2019-08-02 DIAGNOSIS — I10 Essential (primary) hypertension: Secondary | ICD-10-CM | POA: Insufficient documentation

## 2019-08-02 DIAGNOSIS — R5383 Other fatigue: Secondary | ICD-10-CM

## 2019-08-02 LAB — CBC
HCT: 40.1 % (ref 36.0–46.0)
Hemoglobin: 13.4 g/dL (ref 12.0–15.0)
MCH: 30.1 pg (ref 26.0–34.0)
MCHC: 33.4 g/dL (ref 30.0–36.0)
MCV: 90.1 fL (ref 80.0–100.0)
Platelets: 245 10*3/uL (ref 150–400)
RBC: 4.45 MIL/uL (ref 3.87–5.11)
RDW: 12.6 % (ref 11.5–15.5)
WBC: 8.1 10*3/uL (ref 4.0–10.5)
nRBC: 0 % (ref 0.0–0.2)

## 2019-08-02 LAB — URINALYSIS, COMPLETE (UACMP) WITH MICROSCOPIC
Bacteria, UA: NONE SEEN
Bilirubin Urine: NEGATIVE
Glucose, UA: NEGATIVE mg/dL
Hgb urine dipstick: NEGATIVE
Ketones, ur: NEGATIVE mg/dL
Leukocytes,Ua: NEGATIVE
Nitrite: NEGATIVE
Protein, ur: NEGATIVE mg/dL
Specific Gravity, Urine: 1.002 — ABNORMAL LOW (ref 1.005–1.030)
pH: 6 (ref 5.0–8.0)

## 2019-08-02 LAB — BASIC METABOLIC PANEL
Anion gap: 11 (ref 5–15)
BUN: 18 mg/dL (ref 6–20)
CO2: 27 mmol/L (ref 22–32)
Calcium: 9.8 mg/dL (ref 8.9–10.3)
Chloride: 97 mmol/L — ABNORMAL LOW (ref 98–111)
Creatinine, Ser: 1.04 mg/dL — ABNORMAL HIGH (ref 0.44–1.00)
GFR calc Af Amer: >60 mL/min (ref >60)
GFR calc non Af Amer: >60 mL/min (ref >60)
Glucose, Bld: 132 mg/dL — ABNORMAL HIGH (ref 70–99)
Potassium: 3.9 mmol/L (ref 3.5–5.1)
Sodium: 135 mmol/L (ref 135–145)

## 2019-08-02 LAB — POCT PREGNANCY, URINE: Preg Test, Ur: NEGATIVE

## 2019-08-02 MED ORDER — KETOROLAC TROMETHAMINE 30 MG/ML IJ SOLN
15.0000 mg | Freq: Once | INTRAMUSCULAR | Status: AC
  Administered 2019-08-03: 15 mg via INTRAVENOUS
  Filled 2019-08-02: qty 1

## 2019-08-02 MED ORDER — SODIUM CHLORIDE 0.9 % IV BOLUS
1000.0000 mL | Freq: Once | INTRAVENOUS | Status: AC
  Administered 2019-08-03: 1000 mL via INTRAVENOUS

## 2019-08-02 MED ORDER — METOCLOPRAMIDE HCL 5 MG/ML IJ SOLN
10.0000 mg | Freq: Once | INTRAMUSCULAR | Status: AC
  Administered 2019-08-03: 10 mg via INTRAVENOUS
  Filled 2019-08-02: qty 2

## 2019-08-02 NOTE — ED Triage Notes (Signed)
Pt arrives ambulatory to triage with c/o weakness since she sat in a class today for four hours. Pt is in NAD.

## 2019-08-03 ENCOUNTER — Emergency Department: Payer: BC Managed Care – PPO

## 2019-08-03 DIAGNOSIS — R519 Headache, unspecified: Secondary | ICD-10-CM | POA: Diagnosis not present

## 2019-08-03 NOTE — ED Provider Notes (Signed)
[facility] Emergency Department Provider Note  ____________________________________________  Time seen: Approximately 12:33 AM  I have reviewed the triage vital signs and the nursing notes.   HISTORY  Chief Complaint Weakness   HPI Lindsey Cruz is a 54 y.o. female with a history of migraine headaches, diabetes, hypertension, hyperlipidemia, obesity who presents for evaluation of fatigue.  Patient reports that she spent most of the day today on an online class for new home [place].  The class started 8 AM and finished at 5 PM.  She reports that she felt well throughout the day but as soon as the class and she felt extremely tired and with no energy.  She thought that maybe her potassium was low so she ate 2 bananas but still did not feel well.  She denies unilateral weakness or numbness, vertigo, fever, chills, nausea, vomiting, cough, chest pain or shortness of breath, dysuria or hematuria.  She reports that she has been dealing with a headache for about a week.  She was seen in urgent care and received a steroid injection.  At this time the headaches mild, pressure-like, located bilaterally at the base of her head.  The headaches consistent with her migraine headaches.  No neck stiffness or neck pain.  No trauma.  She is not on blood thinners.   Past Medical History:  Diagnosis Date  . Diabetes mellitus without complication (HCC)   . Hypercholesteremia   . Hypertension     Patient Active Problem List   Diagnosis Date Noted  . Vaginal candidiasis 09/22/2018  . Diabetes mellitus without complication (HCC) 02/27/2018  . Essential hypertension 02/27/2018    Past Surgical History:  Procedure Laterality Date  . BREAST CYST ASPIRATION Right 2014   neg  . BREAST EXCISIONAL BIOPSY Right 1983   neg  . TUBAL LIGATION      Prior to Admission medications   Medication Sig Start Date End Date Taking? Authorizing Provider  Accu-Chek FastClix Lancets MISC Use as  directed dx e11.65 05/22/19   Johnna Acosta, NP  atorvastatin (LIPITOR) 20 MG tablet Take 20 mg by mouth daily.    [provider]  bisoprolol-hydrochlorothiazide (ZIAC) 2.5-6.25 MG tablet Take 1 tablet by mouth daily. 05/13/19   Lyndon Code, MD  Dulaglutide (TRULICITY) 1.5 MG/0.5ML SOPN INJECT SUBCUTANEOUSLY 1.5MG  ONCE WEEKLY 07/31/19   Johnna Acosta, NP  fexofenadine-pseudoephedrine (ALLEGRA-D ALLERGY & CONGESTION) 180-240 MG 24 hr tablet Take 1 tablet by mouth daily. 01/16/16   Hassan Rowan, MD  fluticasone (FLONASE) 50 MCG/ACT nasal spray Place 1 spray into both nostrils 2 (two) times daily. 06/30/15 10/03/18  Betancourt, Jarold Song, NP  glucose blood (ACCU-CHEK GUIDE) test strip Use as instructed once a daily dx e11.65 06/10/19   Carlean Jews, NP  hydrochlorothiazide (MICROZIDE) 12.5 MG capsule TAKE 1 CAPSULE BY MOUTH  DAILY 09/23/18   Johnna Acosta, NP  ibuprofen (IBU) 600 MG tablet Take by mouth. 12/25/16   [provider]  meloxicam (MOBIC) 15 MG tablet Take 1 tablet (15 mg total) by mouth daily. 10/03/18   Hyatt, Max T, DPM  Multiple Vitamins-Minerals (EMERGEN-C VITAMIN D/CALCIUM PO) Take by mouth.    [provider]  nystatin ointment (MYCOSTATIN) Apply 1 application topically 3 (three) times daily. 09/19/18   Carlean Jews, NP  SUMAtriptan (IMITREX) 100 MG tablet TAKE 1 TABLET BY MOUTH EVERY 2 HOURS. DO NOT EXCEED 2 DOSES PER 24 HOURS 07/27/19   [provider]  Allergies Patient has no known allergies.  Family History  Problem Relation Age of Onset  . Hypertension Mother   . Hyperlipidemia Mother   . Diabetes Mother   . Diabetes Maternal Grandmother     Social History Social History   Tobacco Use  . Smoking status: Never Smoker  . Smokeless tobacco: Never Used  Substance Use Topics  . Alcohol use: Yes    Comment: occasional  . Drug use: Never    Review of Systems  Constitutional: Negative for fever. + fatigue Eyes:  Negative for visual changes. ENT: Negative for sore throat. Neck: No neck pain  Cardiovascular: Negative for chest pain. Respiratory: Negative for shortness of breath. Gastrointestinal: Negative for abdominal pain, vomiting or diarrhea. Genitourinary: Negative for dysuria. Musculoskeletal: Negative for back pain. Skin: Negative for rash. Neurological: Negative for weakness or numbness. + HA Psych: No SI or HI  ____________________________________________   PHYSICAL EXAM:  VITAL SIGNS: ED Triage Vitals  Enc Vitals Group     BP 08/02/19 2231 139/80     Pulse Rate 08/02/19 2231 84     Resp 08/02/19 2231 18     Temp 08/02/19 2231 98.5 F (36.9 C)     Temp Source 08/02/19 2231 Oral     SpO2 08/02/19 2231 97 %     Weight 08/02/19 2242 253 lb (114.8 kg)     Height 08/02/19 2242 5\' 6"  (1.676 m)     Head Circumference --      Peak Flow --      Pain Score 08/02/19 2242 2     Pain Loc --      Pain Edu? --      Excl. in GC? --     Constitutional: Alert and oriented. Well appearing and in no apparent distress. HEENT:      Head: Normocephalic and atraumatic.         Eyes: Conjunctivae are normal. Sclera is non-icteric.       Mouth/Throat: Mucous membranes are moist.       Neck: Supple with no signs of meningismus. Cardiovascular: Regular rate and rhythm. No murmurs, gallops, or rubs. 2+ symmetrical distal pulses are present in all extremities.  Respiratory: Normal respiratory effort. Lungs are clear to auscultation bilaterally. No wheezes, crackles, or rhonchi.  Gastrointestinal: Soft, non tender, and non distended with positive bowel sounds. No rebound or guarding. Musculoskeletal: Nontender with normal range of motion in all extremities. No edema, cyanosis, or erythema of extremities. Neurologic: Normal speech and language. Face is symmetric. A & O x3, PERRL, EOMI, no nystagmus, CN II-XII intact, motor testing reveals good tone and bulk throughout. There is no evidence of pronator  drift or dysmetria. Muscle strength is 5/5 throughout. Sensory examination is intact. Gait is normal. Skin: Skin is warm, dry and intact. No rash noted. Psychiatric: Mood and affect are normal. Speech and behavior are normal.  ____________________________________________   LABS (all labs ordered are listed, but only abnormal results are displayed)  Labs Reviewed  BASIC METABOLIC PANEL - Abnormal; Notable for the following components:      Result Value   Chloride 97 (*)    Glucose, Bld 132 (*)    Creatinine, Ser 1.04 (*)    All other components within normal limits  URINALYSIS, COMPLETE (UACMP) WITH MICROSCOPIC - Abnormal; Notable for the following components:   Color, Urine COLORLESS (*)    APPearance CLEAR (*)    Specific Gravity, Urine 1.002 (*)    All other components within  normal limits  CBC  CBG MONITORING, ED  POC URINE PREG, ED  POCT PREGNANCY, URINE   ____________________________________________  EKG  ED ECG REPORT I, Rudene Re, the attending physician, personally viewed and interpreted this ECG.  Normal sinus rhythm, rate of 69, normal intervals, normal axis, no ST elevations or depressions.  Normal EKG. ____________________________________________  RADIOLOGY  I have personally reviewed the images performed during this visit and I agree with the Radiologist's read.   Interpretation by Radiologist:  CT Head Wo Contrast  Result Date: 08/03/2019 CLINICAL DATA:  Headache. Weakness. EXAM: CT HEAD WITHOUT CONTRAST TECHNIQUE: Contiguous axial images were obtained from the base of the skull through the vertex without intravenous contrast. COMPARISON:  December 20, 2016. FINDINGS: Brain: No evidence of acute infarction, hemorrhage, hydrocephalus, extra-axial collection or mass lesion/mass effect. Vascular: No hyperdense vessel or unexpected calcification. Skull: Normal. Negative for fracture or focal lesion. Sinuses/Orbits: No acute finding. Other: None. IMPRESSION:  No acute intracranial pathology. Electronically Signed   By: Constance Holster M.D.   On: 08/03/2019 00:46      ____________________________________________   PROCEDURES  Procedure(s) performed: None Procedures Critical Care performed:  None ____________________________________________   INITIAL IMPRESSION / ASSESSMENT AND PLAN / ED COURSE   54 y.o. female with a history of migraine headaches, diabetes, hypertension, hyperlipidemia, obesity who presents for evaluation of fatigue and HA.  Patient is extremely well-appearing and in no distress with normal vitals, complete neurologically intact.  Denies any signs or symptoms of COVID-19 or any known exposures to it.  Has been dealing with her migraine for about a week.  No recent imaging done therefore will get a head CT.  Labs showing no significant abnormalities with no leukocytosis, no anemia, no evidence of DKA, no significant dehydration AKI.  UA negative for UTI.  Will treat with a migraine cocktail.    _________________________ 1:52 AM on 08/03/2019 -----------------------------------------  Head CT is negative.  Patient feels markedly improved after IV fluids and a migraine cocktail.  Headache has resolved.  She continues to look extremely well-appearing.  At this time she stable for discharge home with follow-up with her primary care doctor for continuity of care.  Discussed my standard return precautions.    As part of my medical decision making, I reviewed the following data within the [place] notes reviewed and incorporated, Labs reviewed , Old chart reviewed, Radiograph reviewed , Notes from prior ED visits and [place] Controlled Substance Database   Please note:  Patient was evaluated in Emergency Department today for the symptoms described in the history of present illness. Patient was evaluated in the context of the global COVID-19 pandemic, which necessitated consideration that the patient might be  at risk for infection with the SARS-CoV-2 virus that causes COVID-19. Institutional protocols and algorithms that pertain to the evaluation of patients at risk for COVID-19 are in a state of rapid change based on information released by regulatory bodies including the CDC and federal and state organizations. These policies and algorithms were followed during the patient's care in the ED.  Some ED evaluations and interventions may be delayed as a result of limited staffing during the pandemic.   ____________________________________________   FINAL CLINICAL IMPRESSION(S) / ED DIAGNOSES   Final diagnoses:  Fatigue, unspecified type  Acute nonintractable headache, unspecified headache type      NEW MEDICATIONS STARTED DURING THIS VISIT:  ED Discharge Orders    None       Note:  This  document was prepared using Conservation officer, historic buildings and may include unintentional dictation errors.    Don Perking, [place], MD 08/03/19 [phone]

## 2019-08-03 NOTE — Discharge Instructions (Addendum)
You have been seen in the Emergency Department (ED) for a headache. Your evaluation today was overall reassuring. Headaches have many possible causes. Most headaches aren't a sign of a more serious problem, and they will get better on their own.   Follow-up with your doctor in 12-24 hours if you are still having a headache. Otherwise follow up with your doctor in 3-5 days.  For pain take ibuprofen 600mg  every 6 hours as needed  When should you call for help?  Call 911 or return to the ED anytime you think you may need emergency care. For example, call if:  You have signs of a stroke. These may include:  Sudden numbness, paralysis, or weakness in your face, arm, or leg, especially on only one side of your body.  Sudden vision changes.  Sudden trouble speaking.  Sudden confusion or trouble understanding simple statements.  Sudden problems with walking or balance.  A sudden, severe headache that is different from past headaches. You have new or worsening headache Nausea and vomiting associated with your headache Fever, neck stiffness associated with your headache  Call your doctor now or seek immediate medical care if:  You have a new or worse headache.  Your headache gets much worse.  How can you care for yourself at home?  Do not drive if you have taken a prescription pain medicine.  Rest in a quiet, dark room until your headache is gone. Close your eyes and try to relax or go to sleep. Don't watch TV or read.  Put a cold, moist cloth or cold pack on the painful area for 10 to 20 minutes at a time. Put a thin cloth between the cold pack and your skin.  Use a warm, moist towel or a heating pad set on low to relax tight shoulder and neck muscles.  Have someone gently massage your neck and shoulders.  Take pain medicines exactly as directed.  If the doctor gave you a prescription medicine for pain, take it as prescribed.  If you are not taking a prescription pain medicine, ask your doctor  if you can take an over-the-counter medicine. Be careful not to take pain medicine more often than the instructions allow, because you may get worse or more frequent headaches when the medicine wears off.  Do not ignore new symptoms that occur with a headache, such as a fever, weakness or numbness, vision changes, or confusion. These may be signs of a more serious problem.  To prevent headaches  Keep a headache diary so you can figure out what triggers your headaches. Avoiding triggers may help you prevent headaches. Record when each headache began, how long it lasted, and what the pain was like (throbbing, aching, stabbing, or dull). Write down any other symptoms you had with the headache, such as nausea, flashing lights or dark spots, or sensitivity to bright light or loud noise. Note if the headache occurred near your period. List anything that might have triggered the headache, such as certain foods (chocolate, cheese, wine) or odors, smoke, bright light, stress, or lack of sleep.  Find healthy ways to deal with stress. Headaches are most common during or right after stressful times. Take time to relax before and after you do something that has caused a headache in the past.  Try to keep your muscles relaxed by keeping good posture. Check your jaw, face, neck, and shoulder muscles for tension, and try relaxing them. When sitting at a desk, change positions often, and stretch for  30 seconds each hour.  Get plenty of sleep and exercise.  Eat regularly and well. Long periods without food can trigger a headache.  Treat yourself to a massage. Some people find that regular massages are very helpful in relieving tension.  Limit caffeine by not drinking too much coffee, tea, or soda. But don't quit caffeine suddenly, because that can also give you headaches.  Reduce eyestrain from computers by blinking frequently and looking away from the computer screen every so often. Make sure you have proper eyewear and  that your monitor is set up properly, about an arm's length away.  Seek help if you have depression or anxiety. Your headaches may be linked to these conditions. Treatment can both prevent headaches and help with symptoms of anxiety or depression.

## 2019-08-06 DIAGNOSIS — H04123 Dry eye syndrome of bilateral lacrimal glands: Secondary | ICD-10-CM | POA: Diagnosis not present

## 2019-08-17 ENCOUNTER — Other Ambulatory Visit: Payer: Self-pay | Admitting: Adult Health

## 2019-09-04 ENCOUNTER — Telehealth: Payer: Self-pay

## 2019-09-04 NOTE — Telephone Encounter (Signed)
CONFIRMED AND SCREENED FOR 09-09-19 OV. 

## 2019-09-05 DIAGNOSIS — L299 Pruritus, unspecified: Secondary | ICD-10-CM | POA: Diagnosis not present

## 2019-09-09 ENCOUNTER — Ambulatory Visit: Payer: BC Managed Care – PPO | Admitting: Nurse Practitioner

## 2019-09-09 ENCOUNTER — Other Ambulatory Visit: Payer: Self-pay

## 2019-09-09 ENCOUNTER — Encounter: Payer: Self-pay | Admitting: Nurse Practitioner

## 2019-09-09 VITALS — BP 136/81 | HR 64 | Temp 97.5°F | Ht 66.0 in | Wt 250.8 lb

## 2019-09-09 DIAGNOSIS — E1165 Type 2 diabetes mellitus with hyperglycemia: Secondary | ICD-10-CM

## 2019-09-09 DIAGNOSIS — I10 Essential (primary) hypertension: Secondary | ICD-10-CM

## 2019-09-09 DIAGNOSIS — E782 Mixed hyperlipidemia: Secondary | ICD-10-CM | POA: Diagnosis not present

## 2019-09-09 LAB — FOOD ALLERGY PROFILE
Allergen Corn, IgE: <0.1 kU/L
Clam IgE: <0.1 kU/L
Codfish IgE: <0.1 kU/L
Egg White IgE: <0.1 kU/L
Milk IgE: <0.1 kU/L
Peanut IgE: <0.1 kU/L
Scallop IgE: <0.1 kU/L
Sesame Seed IgE: <0.1 kU/L
Shrimp IgE: <0.1 kU/L
Soybean IgE: <0.1 kU/L
Walnut IgE: <0.1 kU/L
Wheat IgE: <0.1 kU/L

## 2019-09-09 LAB — ALLERGY PANEL 18, NUT MIX GROUP
Allergen Coconut IgE: <0.1 kU/L
F020-IgE Almond: <0.1 kU/L
F202-IgE Cashew Nut: <0.1 kU/L
Hazelnut (Filbert) IgE: <0.1 kU/L
Pecan Nut IgE: <0.1 kU/L

## 2019-09-09 LAB — POCT GLYCOSYLATED HEMOGLOBIN (HGB A1C): Hemoglobin A1C: 5.9 % — AB (ref 4.0–5.6)

## 2019-09-09 MED ORDER — TRULICITY 1.5 MG/0.5ML ~~LOC~~ SOAJ: SUBCUTANEOUS | 1 refills | Status: DC

## 2019-09-09 NOTE — Progress Notes (Signed)
[facility] [address] [place], [place] [address]  Internal MEDICINE  Office Visit Note  Patient Name: Lindsey Cruz  [id number]  [id number]  Date of Service: 09/24/2019  Chief Complaint  Patient presents with  . Diabetes  . Hypertension    The patient is here for follow up visit. She is doing better than she was. Doing well with dietary changes. Monitoring her blood sugars closely. Understands which foods make her sugars better and which make it worse. Is exercising more frequently. Her HgbA1c is 5.9 today, down from 6.3 at her last visit. She continues to take trulicity once weekly. Blood pressure is well managed. She has no new concerns or complaints.       Current Medication: Outpatient Encounter Medications as of 09/09/2019  Medication Sig  . Accu-Chek FastClix Lancets MISC Use as directed dx e11.65  . atorvastatin (LIPITOR) 20 MG tablet Take 20 mg by mouth daily.  . bisoprolol-hydrochlorothiazide (ZIAC) 2.5-6.25 MG tablet Take 1 tablet by mouth daily.  . Dulaglutide (TRULICITY) 1.5 MG/0.5ML SOPN INJECT SUBCUTANEOUSLY 1.5MG  ONCE WEEKLY  . fexofenadine-pseudoephedrine (ALLEGRA-D ALLERGY & CONGESTION) 180-240 MG 24 hr tablet Take 1 tablet by mouth daily.  Marland Kitchen glucose blood (ACCU-CHEK GUIDE) test strip Use as instructed once a daily dx e11.65  . hydrochlorothiazide (MICROZIDE) 12.5 MG capsule TAKE 1 CAPSULE BY MOUTH  DAILY  . ibuprofen (IBU) 600 MG tablet Take by mouth.  . meloxicam (MOBIC) 15 MG tablet Take 1 tablet (15 mg total) by mouth daily.  . Multiple Vitamins-Minerals (EMERGEN-C VITAMIN D/CALCIUM PO) Take by mouth.  . nystatin ointment (MYCOSTATIN) Apply 1 application topically 3 (three) times daily.  . SUMAtriptan (IMITREX) 100 MG tablet TAKE 1 TABLET BY MOUTH EVERY 2 HOURS. DO NOT EXCEED 2 DOSES PER 24 HOURS  . [DISCONTINUED] Dulaglutide (TRULICITY) 1.5 MG/0.5ML SOPN INJECT SUBCUTANEOUSLY 1.5MG  ONCE WEEKLY  . fluticasone (FLONASE) 50 MCG/ACT nasal spray  Place 1 spray into both nostrils 2 (two) times daily.   No facility-administered encounter medications on file as of 09/09/2019.    Surgical History: Past Surgical History:  Procedure Laterality Date  . BREAST CYST ASPIRATION Right 2014   neg  . BREAST EXCISIONAL BIOPSY Right 1983   neg  . TUBAL LIGATION      Medical History: Past Medical History:  Diagnosis Date  . Diabetes mellitus without complication (HCC)   . Hypercholesteremia   . Hypertension     Family History: Family History  Problem Relation Age of Onset  . Hypertension Mother   . Hyperlipidemia Mother   . Diabetes Mother   . Diabetes Maternal Grandmother     Social History   Socioeconomic History  . Marital status: Married    Spouse name: Not on file  . Number of children: Not on file  . Years of education: Not on file  . Highest education level: Not on file  Occupational History  . Not on file  Tobacco Use  . Smoking status: Never Smoker  . Smokeless tobacco: Never Used  Substance and Sexual Activity  . Alcohol use: Yes    Comment: occasional  . Drug use: Never  . Sexual activity: Not on file  Other Topics Concern  . Not on file  Social History Narrative  . Not on file   Social Determinants of Health   Financial Resource Strain:   . Difficulty of Paying Living Expenses:   Food Insecurity:   . Worried About Programme researcher, broadcasting/film/video in the Last Year:   .  Ran Out of Food in the Last Year:   Transportation Needs:   . Film/video editor (Medical):   Marland Kitchen Lack of Transportation (Non-Medical):   Physical Activity:   . Days of Exercise per Week:   . Minutes of Exercise per Session:   Stress:   . Feeling of Stress :   Social Connections:   . Frequency of Communication with Friends and Family:   . Frequency of Social Gatherings with Friends and Family:   . Attends Religious Services:   . Active Member of Clubs or Organizations:   . Attends Archivist Meetings:   Marland Kitchen Marital Status:    Intimate Partner Violence:   . Fear of Current or Ex-Partner:   . Emotionally Abused:   Marland Kitchen Physically Abused:   . Sexually Abused:       Review of Systems  Constitutional: Negative for chills, fatigue and unexpected weight change.  HENT: Negative for congestion, rhinorrhea, sneezing and sore throat.   Respiratory: Negative for cough, chest tightness and shortness of breath.   Cardiovascular: Negative for chest pain and palpitations.  Gastrointestinal: Negative for abdominal pain, constipation, diarrhea, nausea and vomiting.  Endocrine: Negative for cold intolerance, heat intolerance, polydipsia and polyuria.       Blood sugars are improving.  Musculoskeletal: Negative for arthralgias, back pain, joint swelling and neck pain.  Skin: Negative for rash.  Allergic/Immunologic: Negative for environmental allergies.  Neurological: Negative for dizziness, tremors, numbness and headaches.  Hematological: Negative for adenopathy. Does not bruise/bleed easily.  Psychiatric/Behavioral: Negative for behavioral problems and sleep disturbance. The patient is not nervous/anxious.    Today's Vitals   09/09/19 1534  BP: 136/81  Pulse: 64  Temp: (!) 97.5 F (36.4 C)  SpO2: 94%  Weight: 250 lb 12.8 oz (113.8 kg)   Body mass index is 40.48 kg/m.  Physical Exam Vitals and nursing note reviewed.  Constitutional:      General: She is not in acute distress.    Appearance: Normal appearance. She is well-developed. She is not diaphoretic.  HENT:     Head: Normocephalic and atraumatic.     Nose: Nose normal.     Mouth/Throat:     Pharynx: No oropharyngeal exudate.  Eyes:     Extraocular Movements: Extraocular movements intact.     Pupils: Pupils are equal, round, and reactive to light.  Neck:     Thyroid: No thyromegaly.     Vascular: No carotid bruit or JVD.     Trachea: No tracheal deviation.  Cardiovascular:     Rate and Rhythm: Normal rate and regular rhythm.     Heart sounds: Normal  heart sounds. No murmur. No friction rub. No gallop.   Pulmonary:     Effort: Pulmonary effort is normal. No respiratory distress.     Breath sounds: Normal breath sounds. No wheezing or rales.  Chest:     Chest wall: No tenderness.  Abdominal:     Palpations: Abdomen is soft.     Tenderness: There is no abdominal tenderness. There is no guarding.  Musculoskeletal:        General: Normal range of motion.     Cervical back: Normal range of motion and neck supple.  Lymphadenopathy:     Cervical: No cervical adenopathy.  Skin:    General: Skin is warm and dry.  Neurological:     Mental Status: She is alert and oriented to person, place, and time.     Cranial Nerves: No cranial nerve  deficit.  Psychiatric:        Behavior: Behavior normal.        Thought Content: Thought content normal.        Judgment: Judgment normal.    Assessment/Plan: 1. Type 2 diabetes mellitus with hyperglycemia, unspecified whether long term insulin use (HCC) - POCT HgB A1C 5.9 today. Continue trulicity weekly.  - Dulaglutide (TRULICITY) 1.5 MG/0.5ML SOPN; INJECT SUBCUTANEOUSLY 1.5MG  ONCE WEEKLY  Dispense: 12 pen; Refill: 1  2. Essential hypertension Stable. Continue bp medication as prescribed   3. Mixed hyperlipidemia Continue atorvastatin as prescribed.   General Counseling: shanta hartner understanding of the findings of todays visit and agrees with plan of treatment. I have discussed any further diagnostic evaluation that may be needed or ordered today. We also reviewed her medications today. she has been encouraged to call the office with any questions or concerns that should arise related to todays visit.   Diabetes Counseling:  1. Addition of ACE inh/ ARB'S for nephroprotection. Microalbumin is updated  2. Diabetic foot care, prevention of complications. Podiatry consult 3. Exercise and lose weight.  4. Diabetic eye examination, Diabetic eye exam is updated  5. Monitor blood sugar closlely.  nutrition counseling.  6. Sign and symptoms of hypoglycemia including shaking sweating,confusion and headaches.  This patient was seen by Vincent Gros FNP Collaboration with Dr Lyndon Code as a part of collaborative care agreement  Orders Placed This Encounter  Procedures  . POCT HgB A1C    Meds ordered this encounter  Medications  . Dulaglutide (TRULICITY) 1.5 MG/0.5ML SOPN    Sig: INJECT SUBCUTANEOUSLY 1.5MG  ONCE WEEKLY    Dispense:  12 pen    Refill:  1    Order Specific Question:   Supervising Provider    Answer:   Lyndon Code [[id number]]    Total time spent: 30 Minutes   Time spent includes review of chart, medications, test results, and follow up plan with the patient.      Dr Lyndon Code Internal medicine

## 2019-09-11 ENCOUNTER — Ambulatory Visit: Payer: BC Managed Care – PPO | Attending: Internal Medicine

## 2019-09-11 DIAGNOSIS — Z23 Encounter for immunization: Secondary | ICD-10-CM

## 2019-09-11 NOTE — Progress Notes (Signed)
   Covid-19 Vaccination Clinic  Name:  Lindsey Cruz    MRN: [id number] DOB: 1966-01-27  09/11/2019  [name] was observed post Covid-19 immunization for 15 minutes without incident. She was provided with Vaccine Information Sheet and instruction to access the V-Safe system.   [name] was instructed to call 911 with any severe reactions post vaccine: Marland Kitchen Difficulty breathing  . Swelling of face and throat  . A fast heartbeat  . A bad rash all over body  . Dizziness and weakness   Immunizations Administered    Name Date Dose VIS Date Route   Pfizer COVID-19 Vaccine 09/11/2019 10:30 AM 0.3 mL 05/23/2019 Intramuscular   Manufacturer: [employer], [employer]   Lot: [id number]   NDC: [address]-3

## 2019-09-24 DIAGNOSIS — E1165 Type 2 diabetes mellitus with hyperglycemia: Secondary | ICD-10-CM | POA: Insufficient documentation

## 2019-09-24 DIAGNOSIS — E782 Mixed hyperlipidemia: Secondary | ICD-10-CM | POA: Insufficient documentation

## 2019-10-07 ENCOUNTER — Ambulatory Visit: Payer: BC Managed Care – PPO | Attending: Internal Medicine

## 2019-10-07 DIAGNOSIS — Z23 Encounter for immunization: Secondary | ICD-10-CM

## 2019-10-07 NOTE — Progress Notes (Signed)
   Covid-19 Vaccination Clinic  Name:  Lindsey Cruz    MRN: [id number] DOB: 12-15-1965  10/07/2019  Ms. Cary was observed post Covid-19 immunization for 15 minutes without incident. She was provided with Vaccine Information Sheet and instruction to access the V-Safe system.   Ms. Villa was instructed to call 911 with any severe reactions post vaccine: Marland Kitchen Difficulty breathing  . Swelling of face and throat  . A fast heartbeat  . A bad rash all over body  . Dizziness and weakness   Immunizations Administered    Name Date Dose VIS Date Route   Pfizer COVID-19 Vaccine 10/07/2019 10:04 AM 0.3 mL 08/06/2018 Intramuscular   Manufacturer: [employer], [employer]   Lot: [id number]   NDC: [address]-0

## 2019-10-21 ENCOUNTER — Other Ambulatory Visit: Payer: Self-pay | Admitting: Internal Medicine

## 2019-10-21 DIAGNOSIS — I10 Essential (primary) hypertension: Secondary | ICD-10-CM

## 2019-11-06 ENCOUNTER — Other Ambulatory Visit: Payer: Self-pay | Admitting: Internal Medicine

## 2019-11-06 DIAGNOSIS — I10 Essential (primary) hypertension: Secondary | ICD-10-CM

## 2019-11-14 ENCOUNTER — Other Ambulatory Visit: Payer: Self-pay | Admitting: Adult Health

## 2019-11-14 DIAGNOSIS — R2232 Localized swelling, mass and lump, left upper limb: Secondary | ICD-10-CM

## 2019-11-18 ENCOUNTER — Other Ambulatory Visit: Payer: Self-pay

## 2019-11-18 MED ORDER — ATORVASTATIN CALCIUM 20 MG PO TABS: 20.0000 mg | ORAL_TABLET | Freq: Every day | ORAL | 0 refills | Status: DC

## 2019-11-24 ENCOUNTER — Ambulatory Visit
Admission: RE | Admit: 2019-11-24 | Discharge: 2019-11-24 | Disposition: A | Discharge status: Home / Self Care | Payer: BC Managed Care – PPO | Source: Ambulatory Visit | Attending: Adult Health | Admitting: Adult Health

## 2019-11-24 DIAGNOSIS — R2232 Localized swelling, mass and lump, left upper limb: Secondary | ICD-10-CM

## 2019-11-24 DIAGNOSIS — N6002 Solitary cyst of left breast: Secondary | ICD-10-CM | POA: Diagnosis not present

## 2019-11-24 DIAGNOSIS — R921 Mammographic calcification found on diagnostic imaging of breast: Secondary | ICD-10-CM | POA: Diagnosis not present

## 2019-12-04 ENCOUNTER — Telehealth: Payer: Self-pay

## 2019-12-04 NOTE — Telephone Encounter (Signed)
Confirmed appointment on 12/08/2019 and screened for covid. klh °

## 2019-12-08 ENCOUNTER — Ambulatory Visit: Payer: BC Managed Care – PPO | Admitting: Adult Health

## 2019-12-08 ENCOUNTER — Other Ambulatory Visit: Payer: Self-pay

## 2019-12-08 ENCOUNTER — Encounter: Payer: Self-pay | Admitting: Adult Health

## 2019-12-08 VITALS — BP 131/82 | HR 87 | Temp 97.5°F | Resp 16 | Ht 66.0 in | Wt 250.4 lb

## 2019-12-08 DIAGNOSIS — E1165 Type 2 diabetes mellitus with hyperglycemia: Secondary | ICD-10-CM

## 2019-12-08 DIAGNOSIS — E782 Mixed hyperlipidemia: Secondary | ICD-10-CM

## 2019-12-08 DIAGNOSIS — I10 Essential (primary) hypertension: Secondary | ICD-10-CM | POA: Diagnosis not present

## 2019-12-08 DIAGNOSIS — K219 Gastro-esophageal reflux disease without esophagitis: Secondary | ICD-10-CM

## 2019-12-08 LAB — POCT GLYCOSYLATED HEMOGLOBIN (HGB A1C): Hemoglobin A1C: 6.1 % — AB (ref 4.0–5.6)

## 2019-12-08 NOTE — Progress Notes (Signed)
[facility] [address] [place], [place] [address]  Internal MEDICINE  Office Visit Note  Patient Name: Lindsey Cruz  [id number]  [id number]  Date of Service: 12/08/2019  Chief Complaint  Patient presents with   Diabetes   Hyperlipidemia   Hypertension    HPI  Pt is here for follow up.  She has a history of DM, HLD, HTN.  She is doing well. Her A1C is 6.1 today. She reports her diet has become worse but she is working on getting back in line.  She denies any issues.       Current Medication: Outpatient Encounter Medications as of 12/08/2019  Medication Sig   Accu-Chek FastClix Lancets MISC Use as directed dx e11.65   atorvastatin (LIPITOR) 20 MG tablet Take 1 tablet (20 mg total) by mouth daily.   bisoprolol-hydrochlorothiazide (ZIAC) 2.5-6.25 MG tablet TAKE 1 TABLET BY MOUTH  DAILY   Dulaglutide (TRULICITY) 1.5 MG/0.5ML SOPN INJECT SUBCUTANEOUSLY 1.5MG  ONCE WEEKLY   fexofenadine-pseudoephedrine (ALLEGRA-D ALLERGY & CONGESTION) 180-240 MG 24 hr tablet Take 1 tablet by mouth daily.   glucose blood (ACCU-CHEK GUIDE) test strip Use as instructed once a daily dx e11.65   hydrochlorothiazide (MICROZIDE) 12.5 MG capsule TAKE 1 CAPSULE BY MOUTH  DAILY   ibuprofen (IBU) 600 MG tablet Take by mouth.   meloxicam (MOBIC) 15 MG tablet Take 1 tablet (15 mg total) by mouth daily.   Multiple Vitamins-Minerals (EMERGEN-C VITAMIN D/CALCIUM PO) Take by mouth.   nystatin ointment (MYCOSTATIN) Apply 1 application topically 3 (three) times daily.   SUMAtriptan (IMITREX) 100 MG tablet TAKE 1 TABLET BY MOUTH EVERY 2 HOURS. DO NOT EXCEED 2 DOSES PER 24 HOURS   fluticasone (FLONASE) 50 MCG/ACT nasal spray Place 1 spray into both nostrils 2 (two) times daily.   No facility-administered encounter medications on file as of 12/08/2019.    Surgical History: Past Surgical History:  Procedure Laterality Date   BREAST CYST ASPIRATION Right 2014   neg   BREAST EXCISIONAL  BIOPSY Right 1983   neg   TUBAL LIGATION      Medical History: Past Medical History:  Diagnosis Date   Diabetes mellitus without complication (HCC)    Hypercholesteremia    Hypertension     Family History: Family History  Problem Relation Age of Onset   Hypertension Mother    Hyperlipidemia Mother    Diabetes Mother    Diabetes Maternal Grandmother     Social History   Socioeconomic History   Marital status: Married    Spouse name: Not on file   Number of children: Not on file   Years of education: Not on file   Highest education level: Not on file  Occupational History   Not on file  Tobacco Use   Smoking status: Never Smoker   Smokeless tobacco: Never Used  Vaping Use   Vaping Use: Never used  Substance and Sexual Activity   Alcohol use: Yes    Comment: occasional   Drug use: Never   Sexual activity: Not on file  Other Topics Concern   Not on file  Social History Narrative   Not on file   Social Determinants of Health   Financial Resource Strain:    Difficulty of Paying Living Expenses:   Food Insecurity:    Worried About Radiation protection practitioner of Food in the Last Year:    Barista in the Last Year:   Transportation Needs:    Freight forwarder (Medical):  Lack of Transportation (Non-Medical):   Physical Activity:    Days of Exercise per Week:    Minutes of Exercise per Session:   Stress:    Feeling of Stress :   Social Connections:    Frequency of Communication with Friends and Family:    Frequency of Social Gatherings with Friends and Family:    Attends Religious Services:    Active Member of Clubs or Organizations:    Attends Engineer, structural:    Marital Status:   Intimate Partner Violence:    Fear of Current or Ex-Partner:    Emotionally Abused:    Physically Abused:    Sexually Abused:       Review of Systems  Constitutional: Negative for chills, fatigue and unexpected weight  change.  HENT: Negative for congestion, rhinorrhea, sneezing and sore throat.   Eyes: Negative for photophobia, pain and redness.  Respiratory: Negative for cough, chest tightness and shortness of breath.   Cardiovascular: Negative for chest pain and palpitations.  Gastrointestinal: Negative for abdominal pain, constipation, diarrhea, nausea and vomiting.  Endocrine: Negative.   Genitourinary: Negative for dysuria and frequency.  Musculoskeletal: Negative for arthralgias, back pain, joint swelling and neck pain.  Skin: Negative for rash.  Allergic/Immunologic: Negative.   Neurological: Negative for tremors and numbness.  Hematological: Negative for adenopathy. Does not bruise/bleed easily.  Psychiatric/Behavioral: Negative for behavioral problems and sleep disturbance. The patient is not nervous/anxious.     Vital Signs: BP 131/82    Pulse 87    Temp (!) 97.5 F (36.4 C)    Resp 16    Ht 5\' 6"  (1.676 m)    Wt 250 lb 6.4 oz (113.6 kg)    SpO2 96%    BMI 40.42 kg/m    Physical Exam Vitals and nursing note reviewed.  Constitutional:      General: She is not in acute distress.    Appearance: She is well-developed. She is not diaphoretic.  HENT:     Head: Normocephalic and atraumatic.     Mouth/Throat:     Pharynx: No oropharyngeal exudate.  Eyes:     Pupils: Pupils are equal, round, and reactive to light.  Neck:     Thyroid: No thyromegaly.     Vascular: No JVD.     Trachea: No tracheal deviation.  Cardiovascular:     Rate and Rhythm: Normal rate and regular rhythm.     Heart sounds: Normal heart sounds. No murmur heard.  No friction rub. No gallop.   Pulmonary:     Effort: Pulmonary effort is normal. No respiratory distress.     Breath sounds: Normal breath sounds. No wheezing or rales.  Chest:     Chest wall: No tenderness.  Abdominal:     Palpations: Abdomen is soft.     Tenderness: There is no abdominal tenderness. There is no guarding.  Musculoskeletal:         General: Normal range of motion.     Cervical back: Normal range of motion and neck supple.  Lymphadenopathy:     Cervical: No cervical adenopathy.  Skin:    General: Skin is warm and dry.  Neurological:     Mental Status: She is alert and oriented to person, place, and time.     Cranial Nerves: No cranial nerve deficit.  Psychiatric:        Behavior: Behavior normal.        Thought Content: Thought content normal.  Judgment: Judgment normal.    Assessment/Plan: 1. Type 2 diabetes mellitus with hyperglycemia, unspecified whether long term insulin use (HCC) Continue to encourage lifestyle and diet modifications.  - POCT HgB A1C  2. Essential hypertension Controlled, continue to monitor.   3. Mixed hyperlipidemia Continue to monitor lipid panel.  4. Gastroesophageal reflux disease without esophagitis Stable, continue with PPI.   General Counseling: saddie sandeen understanding of the findings of todays visit and agrees with plan of treatment. I have discussed any further diagnostic evaluation that may be needed or ordered today. We also reviewed her medications today. she has been encouraged to call the office with any questions or concerns that should arise related to todays visit.    Orders Placed This Encounter  Procedures   POCT HgB A1C    No orders of the defined types were placed in this encounter.   Time spent: 30 Minutes   This patient was seen by Orson Gear AGNP-C in Collaboration with Dr Lavera Guise as a part of collaborative care agreement     Kendell Bane AGNP-C Internal medicine

## 2019-12-18 NOTE — Progress Notes (Signed)
6 month follow up mammogram

## 2020-01-03 DIAGNOSIS — M5430 Sciatica, unspecified side: Secondary | ICD-10-CM | POA: Diagnosis not present

## 2020-01-29 ENCOUNTER — Other Ambulatory Visit: Payer: Self-pay

## 2020-01-29 DIAGNOSIS — E1165 Type 2 diabetes mellitus with hyperglycemia: Secondary | ICD-10-CM

## 2020-01-29 MED ORDER — TRULICITY 1.5 MG/0.5ML ~~LOC~~ SOAJ: SUBCUTANEOUS | 1 refills | Status: DC

## 2020-02-03 DIAGNOSIS — L821 Other seborrheic keratosis: Secondary | ICD-10-CM | POA: Diagnosis not present

## 2020-02-03 DIAGNOSIS — D2361 Other benign neoplasm of skin of right upper limb, including shoulder: Secondary | ICD-10-CM | POA: Diagnosis not present

## 2020-02-03 DIAGNOSIS — Z1283 Encounter for screening for malignant neoplasm of skin: Secondary | ICD-10-CM | POA: Diagnosis not present

## 2020-02-03 DIAGNOSIS — R202 Paresthesia of skin: Secondary | ICD-10-CM | POA: Diagnosis not present

## 2020-02-15 ENCOUNTER — Other Ambulatory Visit: Payer: Self-pay | Admitting: Adult Health

## 2020-03-09 ENCOUNTER — Ambulatory Visit: Payer: BC Managed Care – PPO | Admitting: Hospice and Palliative Medicine

## 2020-03-09 ENCOUNTER — Encounter: Payer: Self-pay | Admitting: Hospice and Palliative Medicine

## 2020-03-09 ENCOUNTER — Other Ambulatory Visit: Payer: Self-pay

## 2020-03-09 DIAGNOSIS — I10 Essential (primary) hypertension: Secondary | ICD-10-CM

## 2020-03-09 DIAGNOSIS — E1165 Type 2 diabetes mellitus with hyperglycemia: Secondary | ICD-10-CM

## 2020-03-09 DIAGNOSIS — R928 Other abnormal and inconclusive findings on diagnostic imaging of breast: Secondary | ICD-10-CM | POA: Diagnosis not present

## 2020-03-09 DIAGNOSIS — Z0001 Encounter for general adult medical examination with abnormal findings: Secondary | ICD-10-CM

## 2020-03-09 NOTE — Progress Notes (Signed)
[facility] [address] [place], [place] [address]  Internal MEDICINE  Office Visit Note  Patient Name: Lindsey Cruz  [id number]  [id number]  Date of Service: 03/17/2020  Chief Complaint  Patient presents with  . Follow-up    3 month  . Diabetes  . Quality Metric Gaps    pneumonia vacc, HIV screen, Tdap, Flu vaccine    HPI Patient is here for routine follow-up She has recently undergone breast US--Probably benign right breast calcifications, representing milk of calcium (US impression) recommended diagnostic mammogram to be repeated in 6 months, no biopsy performed DM-has been monitoring her glucose levels, feels they have been stable, no episodes of hypoglycemia  Current Medication: Outpatient Encounter Medications as of 03/09/2020  Medication Sig  . Accu-Chek FastClix Lancets MISC Use as directed dx e11.65  . atorvastatin (LIPITOR) 20 MG tablet TAKE 1 TABLET(20 MG) BY MOUTH DAILY  . bisoprolol-hydrochlorothiazide (ZIAC) 2.5-6.25 MG tablet TAKE 1 TABLET BY MOUTH  DAILY  . Dulaglutide (TRULICITY) 1.5 MG/0.5ML SOPN INJECT SUBCUTANEOUSLY 1.5MG  ONCE WEEKLY  . fexofenadine-pseudoephedrine (ALLEGRA-D ALLERGY & CONGESTION) 180-240 MG 24 hr tablet Take 1 tablet by mouth daily.  Marland Kitchen glucose blood (ACCU-CHEK GUIDE) test strip Use as instructed once a daily dx e11.65  . hydrochlorothiazide (MICROZIDE) 12.5 MG capsule TAKE 1 CAPSULE BY MOUTH  DAILY  . ibuprofen (IBU) 600 MG tablet Take by mouth.  . meloxicam (MOBIC) 15 MG tablet Take 1 tablet (15 mg total) by mouth daily.  . Multiple Vitamins-Minerals (EMERGEN-C VITAMIN D/CALCIUM PO) Take by mouth.  . nystatin ointment (MYCOSTATIN) Apply 1 application topically 3 (three) times daily.  . SUMAtriptan (IMITREX) 100 MG tablet TAKE 1 TABLET BY MOUTH EVERY 2 HOURS. DO NOT EXCEED 2 DOSES PER 24 HOURS  . [DISCONTINUED] Dulaglutide (TRULICITY) 1.5 MG/0.5ML SOPN INJECT SUBCUTANEOUSLY 1.5MG  ONCE WEEKLY  . fluticasone (FLONASE) 50  MCG/ACT nasal spray Place 1 spray into both nostrils 2 (two) times daily.   No facility-administered encounter medications on file as of 03/09/2020.    Surgical History: Past Surgical History:  Procedure Laterality Date  . BREAST CYST ASPIRATION Right 2014   neg  . BREAST EXCISIONAL BIOPSY Right 1983   neg  . TUBAL LIGATION      Medical History: Past Medical History:  Diagnosis Date  . Diabetes mellitus without complication (HCC)   . Hypercholesteremia   . Hypertension     Family History: Family History  Problem Relation Age of Onset  . Hypertension Mother   . Hyperlipidemia Mother   . Diabetes Mother   . Diabetes Maternal Grandmother     Social History   Socioeconomic History  . Marital status: Married    Spouse name: Not on file  . Number of children: Not on file  . Years of education: Not on file  . Highest education level: Not on file  Occupational History  . Not on file  Tobacco Use  . Smoking status: Never Smoker  . Smokeless tobacco: Never Used  Vaping Use  . Vaping Use: Never used  Substance and Sexual Activity  . Alcohol use: Yes    Comment: occasional  . Drug use: Never  . Sexual activity: Not on file  Other Topics Concern  . Not on file  Social History Narrative  . Not on file   Social Determinants of Health   Financial Resource Strain:   . Difficulty of Paying Living Expenses: Not on file  Food Insecurity:   . Worried About [employer] of  Food in the Last Year: Not on file  . Ran Out of Food in the Last Year: Not on file  Transportation Needs:   . Lack of Transportation (Medical): Not on file  . Lack of Transportation (Non-Medical): Not on file  Physical Activity:   . Days of Exercise per Week: Not on file  . Minutes of Exercise per Session: Not on file  Stress:   . Feeling of Stress : Not on file  Social Connections:   . Frequency of Communication with Friends and Family: Not on file  . Frequency of Social Gatherings with Friends  and Family: Not on file  . Attends Religious Services: Not on file  . Active Member of Clubs or Organizations: Not on file  . Attends Banker Meetings: Not on file  . Marital Status: Not on file  Intimate Partner Violence:   . Fear of Current or Ex-Partner: Not on file  . Emotionally Abused: Not on file  . Physically Abused: Not on file  . Sexually Abused: Not on file    Review of Systems  Constitutional: Negative for chills, diaphoresis and fatigue.  HENT: Negative for ear pain, postnasal drip and sinus pressure.   Eyes: Negative for photophobia, discharge, redness, itching and visual disturbance.  Respiratory: Negative for cough, shortness of breath and wheezing.   Cardiovascular: Negative for chest pain, palpitations and leg swelling.  Gastrointestinal: Negative for abdominal pain, constipation, diarrhea, nausea and vomiting.  Genitourinary: Negative for dysuria and flank pain.  Musculoskeletal: Negative for arthralgias, back pain, gait problem and neck pain.  Skin: Negative for color change.  Allergic/Immunologic: Negative for environmental allergies and food allergies.  Neurological: Negative for dizziness and headaches.  Hematological: Does not bruise/bleed easily.  Psychiatric/Behavioral: Negative for agitation, behavioral problems (depression) and hallucinations.    Vital Signs: BP 122/84   Pulse 88   Temp (!) 97.3 F (36.3 C)   Resp 16   Ht 5\' 6"  (1.676 m)   Wt 246 lb 12.8 oz (111.9 kg)   SpO2 97%   BMI 39.83 kg/m    Physical Exam Vitals reviewed.  Constitutional:      Appearance: Normal appearance.  HENT:     Mouth/Throat:     Mouth: Mucous membranes are moist.     Pharynx: Oropharynx is clear.  Cardiovascular:     Rate and Rhythm: Normal rate and regular rhythm.     Pulses: Normal pulses.     Heart sounds: Normal heart sounds.  Pulmonary:     Effort: Pulmonary effort is normal.     Breath sounds: Normal breath sounds.  Abdominal:      General: Abdomen is flat.     Palpations: Abdomen is soft.  Musculoskeletal:        General: Normal range of motion.     Cervical back: Normal range of motion.  Skin:    General: Skin is warm.  Neurological:     General: No focal deficit present.     Mental Status: She is alert and oriented to person, place, and time. Mental status is at baseline.  Psychiatric:        Mood and Affect: Mood normal.        Behavior: Behavior normal.        Thought Content: Thought content normal.    Assessment/Plan: 1. Abnormal mammogram Abnormal or right breast--biopsy not required at this time, recommend follow-up diagnostic mammogram in 6 months - MM Digital Diagnostic Unilat R; Future - MM  Digital Diagnostic Unilat R  2. Uncontrolled type 2 diabetes mellitus with hyperglycemia (HCC) A1C 6.1 today, well controlled on current therapy, Requesting refills of Trulicity today Will continue with routine A1C monitoring. - Dulaglutide (TRULICITY) 1.5 MG/0.5ML SOPN; INJECT SUBCUTANEOUSLY 1.5MG  ONCE WEEKLY  Dispense: 6 mL; Refill: 1  3. Essential hypertension BP and HR stable today on current therapy, will continue with routine monitoring.  Labs ordered for upcoming CPE - CBC w/Diff/Platelet - Comprehensive Metabolic Panel (CMET) - Lipid Panel With LDL/HDL Ratio - TSH + free T4  General Counseling: Sayre verbalizes understanding of the findings of todays visit and agrees with plan of treatment. I have discussed any further diagnostic evaluation that may be needed or ordered today. We also reviewed her medications today. she has been encouraged to call the office with any questions or concerns that should arise related to todays visit.    Orders Placed This Encounter  Procedures  . MM Digital Diagnostic Unilat R  . CBC w/Diff/Platelet  . Comprehensive Metabolic Panel (CMET)  . Lipid Panel With LDL/HDL Ratio  . TSH + free T4  . POCT HgB A1C    Meds ordered this encounter  Medications  .  Dulaglutide (TRULICITY) 1.5 MG/0.5ML SOPN    Sig: INJECT SUBCUTANEOUSLY 1.5MG  ONCE WEEKLY    Dispense:  6 mL    Refill:  1    Time spent: 30 Minutes Time spent includes review of chart, medications, test results and follow-up plan with the patient.  This patient was seen by Leeanne Deed AGNP-C in Collaboration with Dr Lyndon Code as a part of collaborative care agreement     Lubertha Basque. Solene Hereford AGNP-C Internal medicine

## 2020-03-10 ENCOUNTER — Encounter: Payer: Self-pay | Admitting: Hospice and Palliative Medicine

## 2020-03-10 MED ORDER — TRULICITY 1.5 MG/0.5ML ~~LOC~~ SOAJ: SUBCUTANEOUS | 1 refills | Status: DC

## 2020-03-11 LAB — POCT GLYCOSYLATED HEMOGLOBIN (HGB A1C): Hemoglobin A1C: 6.1 % — AB (ref 4.0–5.6)

## 2020-03-18 ENCOUNTER — Other Ambulatory Visit: Payer: Self-pay

## 2020-03-18 DIAGNOSIS — I10 Essential (primary) hypertension: Secondary | ICD-10-CM

## 2020-03-18 MED ORDER — BISOPROLOL-HYDROCHLOROTHIAZIDE 2.5-6.25 MG PO TABS: 1.0000 | ORAL_TABLET | Freq: Every day | ORAL | 1 refills | Status: DC

## 2020-03-30 ENCOUNTER — Encounter: Payer: Self-pay | Admitting: Hospice and Palliative Medicine

## 2020-05-17 ENCOUNTER — Other Ambulatory Visit: Payer: Self-pay | Admitting: Internal Medicine

## 2020-06-08 ENCOUNTER — Other Ambulatory Visit: Payer: Self-pay

## 2020-06-08 ENCOUNTER — Ambulatory Visit (INDEPENDENT_AMBULATORY_CARE_PROVIDER_SITE_OTHER): Payer: BC Managed Care – PPO | Admitting: Hospice and Palliative Medicine

## 2020-06-08 ENCOUNTER — Encounter: Payer: Self-pay | Admitting: Hospice and Palliative Medicine

## 2020-06-08 VITALS — BP 136/84 | HR 80 | Temp 97.3°F | Resp 16 | Ht 66.0 in | Wt 245.0 lb

## 2020-06-08 DIAGNOSIS — E1165 Type 2 diabetes mellitus with hyperglycemia: Secondary | ICD-10-CM

## 2020-06-08 DIAGNOSIS — R300 Dysuria: Secondary | ICD-10-CM | POA: Diagnosis not present

## 2020-06-08 DIAGNOSIS — E782 Mixed hyperlipidemia: Secondary | ICD-10-CM

## 2020-06-08 DIAGNOSIS — Z0001 Encounter for general adult medical examination with abnormal findings: Secondary | ICD-10-CM | POA: Diagnosis not present

## 2020-06-08 DIAGNOSIS — Z6839 Body mass index (BMI) 39.0-39.9, adult: Secondary | ICD-10-CM

## 2020-06-08 DIAGNOSIS — I10 Essential (primary) hypertension: Secondary | ICD-10-CM

## 2020-06-08 LAB — POCT GLYCOSYLATED HEMOGLOBIN (HGB A1C): Hemoglobin A1C: 6 % — AB (ref 4.0–5.6)

## 2020-06-08 MED ORDER — TRULICITY 1.5 MG/0.5ML ~~LOC~~ SOAJ: SUBCUTANEOUS | 1 refills | Status: DC

## 2020-06-08 NOTE — Progress Notes (Signed)
[facility] [address] [place], [place] [address]  Internal MEDICINE  Office Visit Note  Patient Name: Lindsey Cruz  [id number]  [id number]  Date of Service: 06/13/2020  Chief Complaint  Patient presents with  . Annual Exam    Right elbow sometimes gets shooting pain  . Hyperlipidemia  . Hypertension  . Diabetes  . Quality Metric Gaps    pneumovax    HPI Patient is here for annual exam Overall, things have been going well Wants to mention right elbow pain--has been ongoing for several months, intermittently gets sharp shooting pain, only lasts a few minutes and resolves without intervention Has not had labs done yet--reminded her to have these drawn  Glucose readings have been well controlled   Had mammogram earlier this year, had to proceed with US--likely benign right breast calcifications, recommend right breast diagnostic mammogram in 6 months--scheduled for New Year  Colonoscopy 2020--normal, repeat in 2030  Sleeping well at night, no recent changes in appetite or elimination habits, stress and anxiety levels well controlled   Current Medication: Outpatient Encounter Medications as of 06/08/2020  Medication Sig  . Accu-Chek FastClix Lancets MISC Use as directed dx e11.65  . atorvastatin (LIPITOR) 20 MG tablet TAKE 1 TABLET(20 MG) BY MOUTH DAILY  . bisoprolol-hydrochlorothiazide (ZIAC) 2.5-6.25 MG tablet Take 1 tablet by mouth daily.  . Dulaglutide (TRULICITY) 1.5 MG/0.5ML SOPN INJECT SUBCUTANEOUSLY 1.5MG  ONCE WEEKLY  . fexofenadine-pseudoephedrine (ALLEGRA-D ALLERGY & CONGESTION) 180-240 MG 24 hr tablet Take 1 tablet by mouth daily.  . fluticasone (FLONASE) 50 MCG/ACT nasal spray Place 1 spray into both nostrils 2 (two) times daily.  Marland Kitchen glucose blood (ACCU-CHEK GUIDE) test strip Use as instructed once a daily dx e11.65  . hydrochlorothiazide (MICROZIDE) 12.5 MG capsule TAKE 1 CAPSULE BY MOUTH  DAILY  . ibuprofen (IBU) 600 MG tablet Take by mouth.  .  meloxicam (MOBIC) 15 MG tablet Take 1 tablet (15 mg total) by mouth daily.  . Multiple Vitamins-Minerals (EMERGEN-C VITAMIN D/CALCIUM PO) Take by mouth.  . nystatin ointment (MYCOSTATIN) Apply 1 application topically 3 (three) times daily.  . SUMAtriptan (IMITREX) 100 MG tablet TAKE 1 TABLET BY MOUTH EVERY 2 HOURS. DO NOT EXCEED 2 DOSES PER 24 HOURS  . [DISCONTINUED] Dulaglutide (TRULICITY) 1.5 MG/0.5ML SOPN INJECT SUBCUTANEOUSLY 1.5MG  ONCE WEEKLY   No facility-administered encounter medications on file as of 06/08/2020.    Surgical History: Past Surgical History:  Procedure Laterality Date  . BREAST CYST ASPIRATION Right 2014   neg  . BREAST EXCISIONAL BIOPSY Right 1983   neg  . TUBAL LIGATION      Medical History: Past Medical History:  Diagnosis Date  . Diabetes mellitus without complication (HCC)   . Hypercholesteremia   . Hypertension     Family History: Family History  Problem Relation Age of Onset  . Hypertension Mother   . Hyperlipidemia Mother   . Diabetes Mother   . Diabetes Maternal Grandmother     Social History   Socioeconomic History  . Marital status: Married    Spouse name: Not on file  . Number of children: Not on file  . Years of education: Not on file  . Highest education level: Not on file  Occupational History  . Not on file  Tobacco Use  . Smoking status: Never Smoker  . Smokeless tobacco: Never Used  Vaping Use  . Vaping Use: Never used  Substance and Sexual Activity  . Alcohol use: Yes    Comment: occasional  .  Drug use: Never  . Sexual activity: Not on file  Other Topics Concern  . Not on file  Social History Narrative  . Not on file   Social Determinants of Health   Financial Resource Strain: Not on file  Food Insecurity: Not on file  Transportation Needs: Not on file  Physical Activity: Not on file  Stress: Not on file  Social Connections: Not on file  Intimate Partner Violence: Not on file      Review of Systems   Constitutional: Negative for chills, diaphoresis and fatigue.  HENT: Negative for ear pain, postnasal drip and sinus pressure.   Eyes: Negative for photophobia, discharge, redness, itching and visual disturbance.  Respiratory: Negative for cough, shortness of breath and wheezing.   Cardiovascular: Negative for chest pain, palpitations and leg swelling.  Gastrointestinal: Negative for abdominal pain, constipation, diarrhea, nausea and vomiting.  Genitourinary: Negative for dysuria and flank pain.  Musculoskeletal: Negative for arthralgias, back pain, gait problem and neck pain.  Skin: Negative for color change.  Allergic/Immunologic: Negative for environmental allergies and food allergies.  Neurological: Negative for dizziness and headaches.  Hematological: Does not bruise/bleed easily.  Psychiatric/Behavioral: Negative for agitation, behavioral problems (depression) and hallucinations.    Vital Signs: BP 136/84   Pulse 80   Temp (!) 97.3 F (36.3 C)   Resp 16   Ht 5\' 6"  (1.676 m)   Wt 245 lb (111.1 kg)   SpO2 98%   BMI 39.54 kg/m    Physical Exam Vitals reviewed.  Constitutional:      Appearance: Normal appearance. She is obese.  HENT:     Right Ear: Tympanic membrane normal.     Left Ear: Tympanic membrane normal.     Nose: Nose normal.     Mouth/Throat:     Mouth: Mucous membranes are moist.     Pharynx: Oropharynx is clear.  Eyes:     Pupils: Pupils are equal, round, and reactive to light.  Cardiovascular:     Rate and Rhythm: Normal rate and regular rhythm.     Pulses:          Dorsalis pedis pulses are 3+ on the right side and 3+ on the left side.       Posterior tibial pulses are 3+ on the right side and 3+ on the left side.     Heart sounds: Normal heart sounds.  Pulmonary:     Effort: Pulmonary effort is normal.     Breath sounds: Normal breath sounds.  Chest:  Breasts:     Right: Normal.     Left: Normal.    Abdominal:     General: Abdomen is flat.      Palpations: Abdomen is soft.  Musculoskeletal:        General: Normal range of motion.     Cervical back: Normal range of motion.     Right foot: Normal range of motion.     Left foot: Normal range of motion.  Feet:     Right foot:     Protective Sensation: 2 sites tested. 2 sites sensed.     Skin integrity: Skin integrity normal.     Toenail Condition: Right toenails are normal.     Left foot:     Protective Sensation: 2 sites tested. 2 sites sensed.     Skin integrity: Skin integrity normal.     Toenail Condition: Left toenails are normal.  Skin:    General: Skin is warm.  Neurological:  General: No focal deficit present.     Mental Status: She is alert and oriented to person, place, and time. Mental status is at baseline.  Psychiatric:        Mood and Affect: Mood normal.        Behavior: Behavior normal.        Thought Content: Thought content normal.        Judgment: Judgment normal.    Assessment/Plan: 1. Encounter for routine adult health examination with abnormal findings Well appearing 54 year old AA female Reminded to have labs drawn for review Scheduled for right breast diagnostic mammogram Up to date on PHM - CBC w/Diff/Platelet - Comprehensive Metabolic Panel (CMET) - Lipid Panel With LDL/HDL Ratio - TSH + free T4  2. Uncontrolled type 2 diabetes mellitus with hyperglycemia (HCC) A1C 6.0 today,well controlled--requesting refills of Trulicity Consider renal US due to long standing DM - POCT HgB A1C - Dulaglutide (TRULICITY) 1.5 MG/0.5ML SOPN; INJECT SUBCUTANEOUSLY 1.5MG  ONCE WEEKLY  Dispense: 6 mL; Refill: 1 - Comprehensive Metabolic Panel (CMET)  3. Dysuria - UA/M w/rflx Culture, Routine - Microscopic Examination  4. Mixed hyperlipidemia Continue with atorvastatin at this time, up date lipid panel and adjust therapy as needed Consider further vascular studies - Lipid Panel With LDL/HDL Ratio  5. Essential hypertension BP and HR well  controlled today--continue monitoring - CBC w/Diff/Platelet - Comprehensive Metabolic Panel (CMET) - TSH + free T4  6. BMI 39.0-39.9,adult Discussed the importance of weight management through healthy eating and daily exercise as tolerated. Discussed the negative effects obesity has on pulmonary health, cardiac health as well as overall general health and well being.   General Counseling: Lindsey Cruz understanding of the findings of todays visit and agrees with plan of treatment. I have discussed any further diagnostic evaluation that may be needed or ordered today. We also reviewed her medications today. she has been encouraged to call the office with any questions or concerns that should arise related to todays visit.  Obesity Counseling: Risk Assessment: An assessment of behavioral risk factors was made today and includes lack of exercise sedentary lifestyle, lack of portion control and poor dietary habits.  Risk Modification Advice: She was counseled on portion control guidelines. Restricting daily caloric intake to 1800. The detrimental long term effects of obesity on her health and ongoing poor compliance was also discussed with the patient.  Orders Placed This Encounter  Procedures  . Microscopic Examination  . UA/M w/rflx Culture, Routine  . CBC w/Diff/Platelet  . Comprehensive Metabolic Panel (CMET)  . Lipid Panel With LDL/HDL Ratio  . TSH + free T4  . POCT HgB A1C    Meds ordered this encounter  Medications  . Dulaglutide (TRULICITY) 1.5 MG/0.5ML SOPN    Sig: INJECT SUBCUTANEOUSLY 1.5MG  ONCE WEEKLY    Dispense:  6 mL    Refill:  1    Time spent:30 Minutes Time spent includes review of chart, medications, test results and follow-up plan with the patient.  This patient was seen by Leeanne Deed AGNP-C in Collaboration with Dr Lyndon Code as a part of collaborative care agreement     Lubertha Basque. Bubber Rothert AGNP-C Internal medicine

## 2020-06-09 LAB — MICROSCOPIC EXAMINATION: Casts: NONE SEEN /lpf

## 2020-06-09 LAB — UA/M W/RFLX CULTURE, ROUTINE
Bilirubin, UA: NEGATIVE
Glucose, UA: NEGATIVE
Ketones, UA: NEGATIVE
Leukocytes,UA: NEGATIVE
Nitrite, UA: NEGATIVE
Protein,UA: NEGATIVE
Specific Gravity, UA: 1.012 (ref 1.005–1.030)
Urobilinogen, Ur: 0.2 mg/dL (ref 0.2–1.0)
pH, UA: 6 (ref 5.0–7.5)

## 2020-06-13 ENCOUNTER — Encounter: Payer: Self-pay | Admitting: Hospice and Palliative Medicine

## 2020-06-14 ENCOUNTER — Ambulatory Visit
Admission: RE | Admit: 2020-06-14 | Discharge: 2020-06-14 | Disposition: A | Discharge status: Home / Self Care | Payer: BC Managed Care – PPO | Source: Ambulatory Visit | Attending: Hospice and Palliative Medicine | Admitting: Hospice and Palliative Medicine

## 2020-06-14 ENCOUNTER — Other Ambulatory Visit: Payer: Self-pay

## 2020-06-14 DIAGNOSIS — R928 Other abnormal and inconclusive findings on diagnostic imaging of breast: Secondary | ICD-10-CM | POA: Diagnosis not present

## 2020-06-14 DIAGNOSIS — R921 Mammographic calcification found on diagnostic imaging of breast: Secondary | ICD-10-CM | POA: Diagnosis not present

## 2020-06-15 ENCOUNTER — Other Ambulatory Visit: Payer: Self-pay | Admitting: Hospice and Palliative Medicine

## 2020-06-21 DIAGNOSIS — Z0001 Encounter for general adult medical examination with abnormal findings: Secondary | ICD-10-CM | POA: Diagnosis not present

## 2020-06-22 LAB — COMPREHENSIVE METABOLIC PANEL
ALT: 17 IU/L (ref 0–32)
AST: 16 IU/L (ref 0–40)
Albumin/Globulin Ratio: 1.7 (ref 1.2–2.2)
Albumin: 4.5 g/dL (ref 3.8–4.9)
Alkaline Phosphatase: 67 IU/L (ref 44–121)
BUN/Creatinine Ratio: 18 (ref 9–23)
BUN: 18 mg/dL (ref 6–24)
Bilirubin Total: 0.3 mg/dL (ref 0.0–1.2)
CO2: 26 mmol/L (ref 20–29)
Calcium: 9.6 mg/dL (ref 8.7–10.2)
Chloride: 102 mmol/L (ref 96–106)
Creatinine, Ser: 1 mg/dL (ref 0.57–1.00)
GFR calc Af Amer: 74 mL/min/{1.73_m2} (ref >59)
GFR calc non Af Amer: 64 mL/min/{1.73_m2} (ref >59)
Globulin, Total: 2.6 g/dL (ref 1.5–4.5)
Glucose: 118 mg/dL — ABNORMAL HIGH (ref 65–99)
Potassium: 4.2 mmol/L (ref 3.5–5.2)
Sodium: 141 mmol/L (ref 134–144)
Total Protein: 7.1 g/dL (ref 6.0–8.5)

## 2020-06-22 LAB — CBC WITH DIFFERENTIAL/PLATELET
Basophils Absolute: 0 10*3/uL (ref 0.0–0.2)
Basos: 1 %
EOS (ABSOLUTE): 0.1 10*3/uL (ref 0.0–0.4)
Eos: 2 %
Hematocrit: 40.7 % (ref 34.0–46.6)
Hemoglobin: 13.2 g/dL (ref 11.1–15.9)
Immature Grans (Abs): 0 10*3/uL (ref 0.0–0.1)
Immature Granulocytes: 0 %
Lymphocytes Absolute: 2.3 10*3/uL (ref 0.7–3.1)
Lymphs: 41 %
MCH: 29.9 pg (ref 26.6–33.0)
MCHC: 32.4 g/dL (ref 31.5–35.7)
MCV: 92 fL (ref 79–97)
Monocytes Absolute: 0.6 10*3/uL (ref 0.1–0.9)
Monocytes: 11 %
Neutrophils Absolute: 2.6 10*3/uL (ref 1.4–7.0)
Neutrophils: 45 %
Platelets: 254 10*3/uL (ref 150–450)
RBC: 4.41 x10E6/uL (ref 3.77–5.28)
RDW: 12.6 % (ref 11.7–15.4)
WBC: 5.6 10*3/uL (ref 3.4–10.8)

## 2020-06-22 LAB — LIPID PANEL WITH LDL/HDL RATIO
Cholesterol, Total: 189 mg/dL (ref 100–199)
HDL: 43 mg/dL (ref >39)
LDL Chol Calc (NIH): 125 mg/dL — ABNORMAL HIGH (ref 0–99)
LDL/HDL Ratio: 2.9 ratio (ref 0.0–3.2)
Triglycerides: 117 mg/dL (ref 0–149)
VLDL Cholesterol Cal: 21 mg/dL (ref 5–40)

## 2020-06-22 LAB — TSH+FREE T4
Free T4: 1.26 ng/dL (ref 0.82–1.77)
TSH: 1.15 u[IU]/mL (ref 0.450–4.500)

## 2020-07-15 ENCOUNTER — Other Ambulatory Visit: Payer: Self-pay

## 2020-07-15 MED ORDER — HYDROCHLOROTHIAZIDE 12.5 MG PO CAPS: 12.5000 mg | ORAL_CAPSULE | Freq: Every day | ORAL | 0 refills | Status: DC

## 2020-08-04 ENCOUNTER — Other Ambulatory Visit: Payer: Self-pay | Admitting: Nurse Practitioner

## 2020-08-12 ENCOUNTER — Other Ambulatory Visit: Payer: Self-pay | Admitting: Adult Health

## 2020-08-17 ENCOUNTER — Other Ambulatory Visit: Payer: Self-pay | Admitting: Internal Medicine

## 2020-08-19 ENCOUNTER — Other Ambulatory Visit: Payer: Self-pay | Admitting: Internal Medicine

## 2020-08-31 ENCOUNTER — Other Ambulatory Visit: Payer: Self-pay | Admitting: Nurse Practitioner

## 2020-08-31 DIAGNOSIS — I10 Essential (primary) hypertension: Secondary | ICD-10-CM

## 2020-09-03 ENCOUNTER — Ambulatory Visit: Payer: BC Managed Care – PPO | Admitting: Hospice and Palliative Medicine

## 2020-09-16 ENCOUNTER — Other Ambulatory Visit: Payer: Self-pay | Admitting: Nurse Practitioner

## 2020-09-16 DIAGNOSIS — I10 Essential (primary) hypertension: Secondary | ICD-10-CM

## 2020-09-21 ENCOUNTER — Other Ambulatory Visit: Payer: Self-pay

## 2020-09-21 DIAGNOSIS — I10 Essential (primary) hypertension: Secondary | ICD-10-CM

## 2020-09-21 MED ORDER — BISOPROLOL-HYDROCHLOROTHIAZIDE 2.5-6.25 MG PO TABS: 1.0000 | ORAL_TABLET | Freq: Every day | ORAL | 1 refills | Status: DC

## 2020-09-23 ENCOUNTER — Other Ambulatory Visit: Payer: Self-pay

## 2020-09-23 ENCOUNTER — Encounter: Payer: Self-pay | Admitting: Physician Assistant

## 2020-09-23 ENCOUNTER — Ambulatory Visit: Payer: BC Managed Care – PPO | Admitting: Physician Assistant

## 2020-09-23 DIAGNOSIS — E782 Mixed hyperlipidemia: Secondary | ICD-10-CM | POA: Diagnosis not present

## 2020-09-23 DIAGNOSIS — I10 Essential (primary) hypertension: Secondary | ICD-10-CM

## 2020-09-23 DIAGNOSIS — Z6839 Body mass index (BMI) 39.0-39.9, adult: Secondary | ICD-10-CM

## 2020-09-23 DIAGNOSIS — E1165 Type 2 diabetes mellitus with hyperglycemia: Secondary | ICD-10-CM

## 2020-09-23 DIAGNOSIS — R928 Other abnormal and inconclusive findings on diagnostic imaging of breast: Secondary | ICD-10-CM | POA: Diagnosis not present

## 2020-09-23 LAB — POCT GLYCOSYLATED HEMOGLOBIN (HGB A1C): Hemoglobin A1C: 5.8 % — AB (ref 4.0–5.6)

## 2020-09-23 NOTE — Progress Notes (Signed)
[facility] [address] [place], [place] [address]  Internal MEDICINE  Office Visit Note  Patient Name: Lindsey Cruz  [id number]  [id number]  Date of Service: 09/23/2020  Chief Complaint  Patient presents with  . Follow-up  . Hyperlipidemia  . Hypertension  . Diabetes  . Quality Metric Gaps    Pap, pneumovax    HPI Pt is here for f/u and has no complaints. -BG at home 100-119 fasting. On trulicity weekly. -Has lost 3 pounds since last visit. -Lab work showed elevated LDL will consider double atorvastatin to 40mg  otherwise patient will continue to work on diet and exercise to help lower back down. -Sleeping well.  -Will have repeat mammogram in June for 6 month f/u   Current Medication: Outpatient Encounter Medications as of 09/23/2020  Medication Sig  . Accu-Chek FastClix Lancets MISC Use as directed dx e11.65  . atorvastatin (LIPITOR) 20 MG tablet TAKE 1 TABLET(20 MG) BY MOUTH DAILY  . bisoprolol-hydrochlorothiazide (ZIAC) 2.5-6.25 MG tablet Take 1 tablet by mouth daily.  . Dulaglutide (TRULICITY) 1.5 MG/0.5ML SOPN INJECT SUBCUTANEOUSLY 1.5MG  ONCE WEEKLY  . fexofenadine-pseudoephedrine (ALLEGRA-D ALLERGY & CONGESTION) 180-240 MG 24 hr tablet Take 1 tablet by mouth daily.  . fluticasone (FLONASE) 50 MCG/ACT nasal spray Place 1 spray into both nostrils 2 (two) times daily.  06-15-2001 glucose blood (ACCU-CHEK GUIDE) test strip AS DIRECTED ONCE A DAY  . hydrochlorothiazide (MICROZIDE) 12.5 MG capsule TAKE 1 CAPSULE BY MOUTH  DAILY  . ibuprofen (IBU) 600 MG tablet Take by mouth.  . meloxicam (MOBIC) 15 MG tablet Take 1 tablet (15 mg total) by mouth daily.  . Multiple Vitamins-Minerals (EMERGEN-C VITAMIN D/CALCIUM PO) Take by mouth.  . nystatin ointment (MYCOSTATIN) Apply 1 application topically 3 (three) times daily.  . SUMAtriptan (IMITREX) 100 MG tablet TAKE 1 TABLET BY MOUTH EVERY 2 HOURS. DO NOT EXCEED 2 DOSES PER 24 HOURS   No facility-administered encounter  medications on file as of 09/23/2020.    Surgical History: Past Surgical History:  Procedure Laterality Date  . BREAST CYST ASPIRATION Right 2014   neg  . BREAST EXCISIONAL BIOPSY Right 1983   neg  . TUBAL LIGATION      Medical History: Past Medical History:  Diagnosis Date  . Diabetes mellitus without complication (HCC)   . Hypercholesteremia   . Hypertension     Family History: Family History  Problem Relation Age of Onset  . Hypertension Mother   . Hyperlipidemia Mother   . Diabetes Mother   . Diabetes Maternal Grandmother     Social History   Socioeconomic History  . Marital status: Married    Spouse name: Not on file  . Number of children: Not on file  . Years of education: Not on file  . Highest education level: Not on file  Occupational History  . Not on file  Tobacco Use  . Smoking status: Never Smoker  . Smokeless tobacco: Never Used  Vaping Use  . Vaping Use: Never used  Substance and Sexual Activity  . Alcohol use: Yes    Comment: occasional  . Drug use: Never  . Sexual activity: Not on file  Other Topics Concern  . Not on file  Social History Narrative  . Not on file   Social Determinants of Health   Financial Resource Strain: Not on file  Food Insecurity: Not on file  Transportation Needs: Not on file  Physical Activity: Not on file  Stress: Not on file  Social  Connections: Not on file  Intimate Partner Violence: Not on file      Review of Systems  Constitutional: Negative for chills, fatigue and unexpected weight change.  HENT: Negative for congestion, postnasal drip, rhinorrhea, sneezing and sore throat.   Eyes: Negative for redness.  Respiratory: Negative for cough, chest tightness and shortness of breath.   Cardiovascular: Negative for chest pain and palpitations.  Gastrointestinal: Negative for abdominal pain, constipation, diarrhea, nausea and vomiting.  Genitourinary: Negative for dysuria and frequency.  Musculoskeletal:  Negative for arthralgias, back pain, joint swelling and neck pain.  Skin: Negative for rash.  Neurological: Negative.  Negative for tremors and numbness.  Hematological: Negative for adenopathy. Does not bruise/bleed easily.  Psychiatric/Behavioral: Negative for behavioral problems (Depression), sleep disturbance and suicidal ideas. The patient is not nervous/anxious.     Vital Signs: BP 118/72   Pulse 80   Temp (!) 97.3 F (36.3 C)   Resp 16   Ht 5\' 6"  (1.676 m)   Wt 242 lb (109.8 kg)   SpO2 97%   BMI 39.06 kg/m    Physical Exam Vitals and nursing note reviewed.  Constitutional:      General: She is not in acute distress.    Appearance: She is well-developed. She is obese. She is not diaphoretic.  HENT:     Head: Normocephalic and atraumatic.     Mouth/Throat:     Pharynx: No oropharyngeal exudate.  Eyes:     Pupils: Pupils are equal, round, and reactive to light.  Neck:     Thyroid: No thyromegaly.     Vascular: No JVD.     Trachea: No tracheal deviation.  Cardiovascular:     Rate and Rhythm: Normal rate and regular rhythm.     Heart sounds: Normal heart sounds. No murmur heard. No friction rub. No gallop.   Pulmonary:     Effort: Pulmonary effort is normal. No respiratory distress.     Breath sounds: No wheezing or rales.  Chest:     Chest wall: No tenderness.  Abdominal:     General: Bowel sounds are normal.     Palpations: Abdomen is soft.  Musculoskeletal:        General: Normal range of motion.     Cervical back: Normal range of motion and neck supple.  Lymphadenopathy:     Cervical: No cervical adenopathy.  Skin:    General: Skin is warm and dry.  Neurological:     Mental Status: She is alert and oriented to person, place, and time.     Cranial Nerves: No cranial nerve deficit.  Psychiatric:        Behavior: Behavior normal.        Thought Content: Thought content normal.        Judgment: Judgment normal.        Assessment/Plan: 1.  Uncontrolled type 2 diabetes mellitus with hyperglycemia (HCC) - POCT HgB A1C is 5.8 today. Doing well on trulicity weekly--will continue and work on diet and exercise.  2. Essential hypertension Well controlled, continue Ziac and HCTZ.  3. Mixed hyperlipidemia Elevated LDL from last year despite being on 20mg  Atorvastatin. Discussed trying to double to 40mg  however patient may also work to improve diet and exercise with wt loss goals and see if that helps lower LDL on current dose of 20mg .  4. Abnormal mammogram Had diagnostic mammogram in Jan, recommend 6 month f/u--pt told they will call to schedule her for this around June.  5. BMI  39.0-39.9,adult Discussed the importance of weight management through healthy eating and daily exercise as tolerated. Discussed the negative effects obesity has on pulmonary health, cardiac health as well as overall general health and well being.    General Counseling: onalee steinbach understanding of the findings of todays visit and agrees with plan of treatment. I have discussed any further diagnostic evaluation that may be needed or ordered today. We also reviewed her medications today. she has been encouraged to call the office with any questions or concerns that should arise related to todays visit.    Orders Placed This Encounter  Procedures  . POCT HgB A1C    No orders of the defined types were placed in this encounter.   This patient was seen by Lynn Ito, PA-C in collaboration with Dr. Beverely Risen as a part of collaborative care agreement.   Total time spent:30 Minutes Time spent includes review of chart, medications, test results, and follow up plan with the patient.      Dr Lyndon Code Internal medicine

## 2020-10-21 ENCOUNTER — Other Ambulatory Visit: Payer: Self-pay | Admitting: Internal Medicine

## 2020-10-22 ENCOUNTER — Telehealth: Payer: Self-pay

## 2020-10-22 ENCOUNTER — Other Ambulatory Visit: Payer: Self-pay

## 2020-10-22 MED ORDER — ATORVASTATIN CALCIUM 40 MG PO TABS: 40.0000 mg | ORAL_TABLET | Freq: Every day | ORAL | 1 refills | Status: DC

## 2020-10-22 NOTE — Telephone Encounter (Signed)
Pt called that lauren want her to take lipitor 40 mg and she tried she if we can send pres for atorvastatin 40 mg as per lauren send pres to [facility]

## 2020-10-26 DIAGNOSIS — J069 Acute upper respiratory infection, unspecified: Secondary | ICD-10-CM | POA: Diagnosis not present

## 2020-10-26 DIAGNOSIS — M791 Myalgia, unspecified site: Secondary | ICD-10-CM | POA: Diagnosis not present

## 2020-10-26 DIAGNOSIS — Z20822 Contact with and (suspected) exposure to covid-19: Secondary | ICD-10-CM | POA: Diagnosis not present

## 2020-12-03 ENCOUNTER — Other Ambulatory Visit: Payer: Self-pay

## 2020-12-03 DIAGNOSIS — E1165 Type 2 diabetes mellitus with hyperglycemia: Secondary | ICD-10-CM

## 2020-12-03 MED ORDER — TRULICITY 1.5 MG/0.5ML ~~LOC~~ SOAJ
SUBCUTANEOUS | 1 refills | Status: DC
Start: 2020-12-03 — End: 2021-03-16

## 2020-12-23 ENCOUNTER — Ambulatory Visit: Payer: BC Managed Care – PPO | Admitting: Physician Assistant

## 2021-01-14 ENCOUNTER — Other Ambulatory Visit: Payer: Self-pay

## 2021-01-14 ENCOUNTER — Ambulatory Visit: Payer: BC Managed Care – PPO | Admitting: Physician Assistant

## 2021-01-14 ENCOUNTER — Encounter: Payer: Self-pay | Admitting: Physician Assistant

## 2021-01-14 DIAGNOSIS — R928 Other abnormal and inconclusive findings on diagnostic imaging of breast: Secondary | ICD-10-CM | POA: Diagnosis not present

## 2021-01-14 DIAGNOSIS — E1165 Type 2 diabetes mellitus with hyperglycemia: Secondary | ICD-10-CM | POA: Diagnosis not present

## 2021-01-14 DIAGNOSIS — E782 Mixed hyperlipidemia: Secondary | ICD-10-CM

## 2021-01-14 DIAGNOSIS — I10 Essential (primary) hypertension: Secondary | ICD-10-CM | POA: Diagnosis not present

## 2021-01-14 MED ORDER — ATORVASTATIN CALCIUM 40 MG PO TABS: 40.0000 mg | ORAL_TABLET | Freq: Every day | ORAL | 1 refills | Status: DC

## 2021-01-14 NOTE — Progress Notes (Signed)
[facility] [address] [place], [place] [address]  Internal MEDICINE  Office Visit Note  Patient Name: Lindsey Cruz  [id number]  [id number]  Date of Service: 01/14/2021  Chief Complaint  Patient presents with   Follow-up    When pt sleeps, hands will fall asleep and will wake pt up    Diabetes   Hyperlipidemia   Hypertension    HPI Pt is here for routine follow up -BG have been 103-130 at highest. Does admit to having  some pizza and pasta a few times that led to the 130 fasting readings the next morning, but overall still well controlled. -BP not checked regularly at home and stable in office, but states she plans to start tracking more at home -She has  hx of abnormal mammogram and was supposed to have a 17month repeat in June but states she never received a call to schedule this. Will go ahead and place an order and patient will call them to figure our scheduling.  -She also mentions her hands will fall asleep when she is sleeping at night and does tend to sleep on her hands. This resolves when she wakes up, but wanted to mention to make sure she wasn't developing neuropathy from her diabetes. Discussed that since this only happens at night while sleeping and resolves once she moves that it is likely positional and may try to avoid sleeping in this position to help avoid this from happening.  Current Medication: Outpatient Encounter Medications as of 01/14/2021  Medication Sig   Accu-Chek FastClix Lancets MISC Use as directed dx e11.65   bisoprolol-hydrochlorothiazide (ZIAC) 2.5-6.25 MG tablet Take 1 tablet by mouth daily.   Dulaglutide (TRULICITY) 1.5 MG/0.5ML SOPN INJECT SUBCUTANEOUSLY 1.5MG  ONCE WEEKLY   glucose blood (ACCU-CHEK GUIDE) test strip AS DIRECTED ONCE A DAY   hydrochlorothiazide (MICROZIDE) 12.5 MG capsule TAKE 1 CAPSULE BY MOUTH  DAILY   Multiple Vitamins-Minerals (EMERGEN-C VITAMIN D/CALCIUM PO) Take by mouth.   SUMAtriptan (IMITREX) 100 MG tablet  TAKE 1 TABLET BY MOUTH EVERY 2 HOURS. DO NOT EXCEED 2 DOSES PER 24 HOURS   [DISCONTINUED] atorvastatin (LIPITOR) 40 MG tablet Take 1 tablet (40 mg total) by mouth daily.   atorvastatin (LIPITOR) 40 MG tablet Take 1 tablet (40 mg total) by mouth daily.   [DISCONTINUED] fexofenadine-pseudoephedrine (ALLEGRA-D ALLERGY & CONGESTION) 180-240 MG 24 hr tablet Take 1 tablet by mouth daily. (Patient not taking: Reported on 01/14/2021)   [DISCONTINUED] fluticasone (FLONASE) 50 MCG/ACT nasal spray Place 1 spray into both nostrils 2 (two) times daily.   [DISCONTINUED] ibuprofen (ADVIL) 600 MG tablet Take by mouth. (Patient not taking: Reported on 01/14/2021)   [DISCONTINUED] meloxicam (MOBIC) 15 MG tablet Take 1 tablet (15 mg total) by mouth daily. (Patient not taking: Reported on 01/14/2021)   [DISCONTINUED] nystatin ointment (MYCOSTATIN) Apply 1 application topically 3 (three) times daily. (Patient not taking: Reported on 01/14/2021)   No facility-administered encounter medications on file as of 01/14/2021.    Surgical History: Past Surgical History:  Procedure Laterality Date   BREAST CYST ASPIRATION Right 2014   neg   BREAST EXCISIONAL BIOPSY Right 1983   neg   TUBAL LIGATION      Medical History: Past Medical History:  Diagnosis Date   Diabetes mellitus without complication (HCC)    Hypercholesteremia    Hypertension     Family History: Family History  Problem Relation Age of Onset   Hypertension Mother    Hyperlipidemia Mother  Diabetes Mother    Diabetes Maternal Grandmother     Social History   Socioeconomic History   Marital status: Married    Spouse name: Not on file   Number of children: Not on file   Years of education: Not on file   Highest education level: Not on file  Occupational History   Not on file  Tobacco Use   Smoking status: Never   Smokeless tobacco: Never  Vaping Use   Vaping Use: Never used  Substance and Sexual Activity   Alcohol use: Yes    Comment:  occasional   Drug use: Never   Sexual activity: Not on file  Other Topics Concern   Not on file  Social History Narrative   Not on file   Social Determinants of Health   Financial Resource Strain: Not on file  Food Insecurity: Not on file  Transportation Needs: Not on file  Physical Activity: Not on file  Stress: Not on file  Social Connections: Not on file  Intimate Partner Violence: Not on file      Review of Systems  Constitutional:  Negative for chills, fatigue and unexpected weight change.  HENT:  Negative for congestion, postnasal drip, rhinorrhea, sneezing and sore throat.   Eyes:  Negative for redness.  Respiratory:  Negative for cough, chest tightness and shortness of breath.   Cardiovascular:  Negative for chest pain and palpitations.  Gastrointestinal:  Negative for abdominal pain, constipation, diarrhea, nausea and vomiting.  Genitourinary:  Negative for dysuria and frequency.  Musculoskeletal:  Negative for arthralgias, back pain, joint swelling and neck pain.  Skin:  Negative for rash.  Neurological:  Positive for numbness. Negative for tremors.       Numbness/hands fall asleep at night only  Hematological:  Negative for adenopathy. Does not bruise/bleed easily.  Psychiatric/Behavioral:  Negative for behavioral problems (Depression), sleep disturbance and suicidal ideas. The patient is not nervous/anxious.    Vital Signs: BP 116/69   Pulse 75   Temp (!) 97.4 F (36.3 C)   Resp 16   Ht 5\' 6"  (1.676 m)   Wt 244 lb 9.6 oz (110.9 kg)   SpO2 97%   BMI 39.48 kg/m    Physical Exam Vitals and nursing note reviewed.  Constitutional:      General: She is not in acute distress.    Appearance: She is well-developed. She is obese. She is not diaphoretic.  HENT:     Head: Normocephalic and atraumatic.     Mouth/Throat:     Pharynx: No oropharyngeal exudate.  Eyes:     Pupils: Pupils are equal, round, and reactive to light.  Neck:     Thyroid: No  thyromegaly.     Vascular: No JVD.     Trachea: No tracheal deviation.  Cardiovascular:     Rate and Rhythm: Normal rate and regular rhythm.     Heart sounds: Normal heart sounds. No murmur heard.   No friction rub. No gallop.  Pulmonary:     Effort: Pulmonary effort is normal. No respiratory distress.     Breath sounds: No wheezing or rales.  Chest:     Chest wall: No tenderness.  Abdominal:     General: Bowel sounds are normal.     Palpations: Abdomen is soft.  Musculoskeletal:        General: Normal range of motion.     Cervical back: Normal range of motion and neck supple.  Lymphadenopathy:     Cervical: No  cervical adenopathy.  Skin:    General: Skin is warm and dry.  Neurological:     Mental Status: She is alert and oriented to person, place, and time.     Cranial Nerves: No cranial nerve deficit.     Sensory: No sensory deficit.  Psychiatric:        Behavior: Behavior normal.        Thought Content: Thought content normal.        Judgment: Judgment normal.       Assessment/Plan: 1. Uncontrolled type 2 diabetes mellitus with hyperglycemia (HCC) - POCT HgB A1C is 6.2, which is increased from 5.8 last visit. Patient is going to work on diet and exercise further  and continue trulicity weekly.  2. Essential hypertension Stable, continue current medications  3. Mixed hyperlipidemia Stable, continue lipitor - atorvastatin (LIPITOR) 40 MG tablet; Take 1 tablet (40 mg total) by mouth daily.  Dispense: 90 tablet; Refill: 1  4. Abnormal mammogram Will place new order for 6 month follow up and patient will call to schedule - MM DIAG BREAST TOMO BILATERAL; Future   General Counseling: dietra stokely understanding of the findings of todays visit and agrees with plan of treatment. I have discussed any further diagnostic evaluation that may be needed or ordered today. We also reviewed her medications today. she has been encouraged to call the office with any questions or  concerns that should arise related to todays visit.    Orders Placed This Encounter  Procedures   MM DIAG BREAST TOMO BILATERAL   POCT HgB A1C    Meds ordered this encounter  Medications   atorvastatin (LIPITOR) 40 MG tablet    Sig: Take 1 tablet (40 mg total) by mouth daily.    Dispense:  90 tablet    Refill:  1     This patient was seen by Lynn Ito, PA-C in collaboration with Dr. Beverely Risen as a part of collaborative care agreement.   Total time spent:35 Minutes Time spent includes review of chart, medications, test results, and follow up plan with the patient.      Dr Lyndon Code Internal medicine

## 2021-01-17 LAB — POCT GLYCOSYLATED HEMOGLOBIN (HGB A1C): Hemoglobin A1C: 6.2 % — AB (ref 4.0–5.6)

## 2021-03-01 ENCOUNTER — Other Ambulatory Visit: Payer: Self-pay

## 2021-03-01 DIAGNOSIS — I10 Essential (primary) hypertension: Secondary | ICD-10-CM

## 2021-03-01 MED ORDER — BISOPROLOL-HYDROCHLOROTHIAZIDE 2.5-6.25 MG PO TABS
1.0000 | ORAL_TABLET | Freq: Every day | ORAL | 1 refills | Status: DC
Start: 2021-03-01 — End: 2021-08-11

## 2021-03-09 ENCOUNTER — Other Ambulatory Visit: Payer: Self-pay | Admitting: Internal Medicine

## 2021-03-16 ENCOUNTER — Other Ambulatory Visit: Payer: Self-pay

## 2021-03-16 DIAGNOSIS — E1165 Type 2 diabetes mellitus with hyperglycemia: Secondary | ICD-10-CM

## 2021-03-16 MED ORDER — TRULICITY 1.5 MG/0.5ML ~~LOC~~ SOAJ: SUBCUTANEOUS | 3 refills | Status: DC

## 2021-03-22 ENCOUNTER — Other Ambulatory Visit: Payer: Self-pay | Admitting: Internal Medicine

## 2021-04-15 ENCOUNTER — Ambulatory Visit: Payer: BC Managed Care – PPO | Admitting: Physician Assistant

## 2021-04-21 ENCOUNTER — Other Ambulatory Visit: Payer: Self-pay

## 2021-04-21 ENCOUNTER — Encounter: Payer: Self-pay | Admitting: Physician Assistant

## 2021-04-21 ENCOUNTER — Ambulatory Visit: Payer: BC Managed Care – PPO | Admitting: Physician Assistant

## 2021-04-21 VITALS — BP 128/84 | HR 78 | Temp 98.0°F | Resp 16 | Ht 66.0 in | Wt 240.0 lb

## 2021-04-21 DIAGNOSIS — E1165 Type 2 diabetes mellitus with hyperglycemia: Secondary | ICD-10-CM

## 2021-04-21 DIAGNOSIS — R5383 Other fatigue: Secondary | ICD-10-CM

## 2021-04-21 DIAGNOSIS — E119 Type 2 diabetes mellitus without complications: Secondary | ICD-10-CM | POA: Diagnosis not present

## 2021-04-21 DIAGNOSIS — E782 Mixed hyperlipidemia: Secondary | ICD-10-CM

## 2021-04-21 DIAGNOSIS — R928 Other abnormal and inconclusive findings on diagnostic imaging of breast: Secondary | ICD-10-CM | POA: Diagnosis not present

## 2021-04-21 DIAGNOSIS — I10 Essential (primary) hypertension: Secondary | ICD-10-CM | POA: Diagnosis not present

## 2021-04-21 LAB — POCT GLYCOSYLATED HEMOGLOBIN (HGB A1C): Hemoglobin A1C: 6.2 % — AB (ref 4.0–5.6)

## 2021-04-21 MED ORDER — ATORVASTATIN CALCIUM 40 MG PO TABS: 40.0000 mg | ORAL_TABLET | Freq: Every day | ORAL | 1 refills | Status: DC

## 2021-04-21 NOTE — Progress Notes (Signed)
[facility] [address] [place], [place] [address]  Internal MEDICINE  Office Visit Note  Patient Name: Lindsey Cruz  [id number]  [id number]  Date of Service: 04/26/2021  Chief Complaint  Patient presents with   Follow-up   Diabetes   Hypertension   Hyperlipidemia    HPI Pt is here for routine follow up -More aches and pains lately, but also more active -Been busy with daughter in marching band now and she is helping out with this -walking parade wore her out after with feeling sore. Feels deconditioned and will try to slowly increase activity. -BG normally 99-112, one high reading at 138 -BP at home not checked -Still has not heard anything from [place] about scheduling 6 month follow up from abnormal mammogram last Jan--she is going to call today to see why this has not been done, especially since new order also placed several months ago.  Current Medication: Outpatient Encounter Medications as of 04/21/2021  Medication Sig   Accu-Chek FastClix Lancets MISC Use as directed dx e11.65   bisoprolol-hydrochlorothiazide (ZIAC) 2.5-6.25 MG tablet Take 1 tablet by mouth daily.   Dulaglutide (TRULICITY) 1.5 MG/0.5ML SOPN INJECT SUBCUTANEOUSLY 1.5MG  ONCE WEEKLY   glucose blood (ACCU-CHEK GUIDE) test strip TEST BLOOD SUGAR EVERY DAY AS DIRECTED   hydrochlorothiazide (MICROZIDE) 12.5 MG capsule TAKE 1 CAPSULE BY MOUTH  DAILY   Multiple Vitamins-Minerals (EMERGEN-C VITAMIN D/CALCIUM PO) Take by mouth.   SUMAtriptan (IMITREX) 100 MG tablet TAKE 1 TABLET BY MOUTH EVERY 2 HOURS. DO NOT EXCEED 2 DOSES PER 24 HOURS   [DISCONTINUED] atorvastatin (LIPITOR) 40 MG tablet Take 1 tablet (40 mg total) by mouth daily.   atorvastatin (LIPITOR) 40 MG tablet Take 1 tablet (40 mg total) by mouth daily.   No facility-administered encounter medications on file as of 04/21/2021.    Surgical History: Past Surgical History:  Procedure Laterality Date   BREAST CYST ASPIRATION Right 2014    neg   BREAST EXCISIONAL BIOPSY Right 1983   neg   TUBAL LIGATION      Medical History: Past Medical History:  Diagnosis Date   Diabetes mellitus without complication (HCC)    Hypercholesteremia    Hypertension     Family History: Family History  Problem Relation Age of Onset   Hypertension Mother    Hyperlipidemia Mother    Diabetes Mother    Diabetes Maternal Grandmother     Social History   Socioeconomic History   Marital status: Married    Spouse name: Not on file   Number of children: Not on file   Years of education: Not on file   Highest education level: Not on file  Occupational History   Not on file  Tobacco Use   Smoking status: Never   Smokeless tobacco: Never  Vaping Use   Vaping Use: Never used  Substance and Sexual Activity   Alcohol use: Yes    Comment: occasional   Drug use: Never   Sexual activity: Not on file  Other Topics Concern   Not on file  Social History Narrative   Not on file   Social Determinants of Health   Financial Resource Strain: Not on file  Food Insecurity: Not on file  Transportation Needs: Not on file  Physical Activity: Not on file  Stress: Not on file  Social Connections: Not on file  Intimate Partner Violence: Not on file      Review of Systems  Constitutional:  Positive for fatigue. Negative for chills  and unexpected weight change.  HENT:  Negative for congestion, postnasal drip, rhinorrhea, sneezing and sore throat.   Eyes:  Negative for redness.  Respiratory:  Negative for cough, chest tightness and shortness of breath.   Cardiovascular:  Negative for chest pain and palpitations.  Gastrointestinal:  Negative for abdominal pain, constipation, diarrhea, nausea and vomiting.  Genitourinary:  Negative for dysuria and frequency.  Musculoskeletal:  Positive for arthralgias. Negative for back pain, joint swelling and neck pain.  Skin:  Negative for rash.  Neurological: Negative.  Negative for tremors and  numbness.  Hematological:  Negative for adenopathy. Does not bruise/bleed easily.  Psychiatric/Behavioral:  Negative for behavioral problems (Depression), sleep disturbance and suicidal ideas. The patient is not nervous/anxious.    Vital Signs: BP 128/84   Pulse 78   Temp 98 F (36.7 C)   Resp 16   Ht 5\' 6"  (1.676 m)   Wt 240 lb (108.9 kg)   SpO2 98%   BMI 38.74 kg/m    Physical Exam Vitals and nursing note reviewed.  Constitutional:      General: She is not in acute distress.    Appearance: She is well-developed. She is obese. She is not diaphoretic.  HENT:     Head: Normocephalic and atraumatic.     Mouth/Throat:     Pharynx: No oropharyngeal exudate.  Eyes:     Pupils: Pupils are equal, round, and reactive to light.  Neck:     Thyroid: No thyromegaly.     Vascular: No JVD.     Trachea: No tracheal deviation.  Cardiovascular:     Rate and Rhythm: Normal rate and regular rhythm.     Heart sounds: Normal heart sounds. No murmur heard.   No friction rub. No gallop.  Pulmonary:     Effort: Pulmonary effort is normal. No respiratory distress.     Breath sounds: No wheezing or rales.  Chest:     Chest wall: No tenderness.  Abdominal:     General: Bowel sounds are normal.     Palpations: Abdomen is soft.  Musculoskeletal:        General: Normal range of motion.     Cervical back: Normal range of motion and neck supple.  Lymphadenopathy:     Cervical: No cervical adenopathy.  Skin:    General: Skin is warm and dry.  Neurological:     Mental Status: She is alert and oriented to person, place, and time.     Cranial Nerves: No cranial nerve deficit.     Sensory: No sensory deficit.  Psychiatric:        Behavior: Behavior normal.        Thought Content: Thought content normal.        Judgment: Judgment normal.       Assessment/Plan: 1. Type 2 diabetes mellitus without complication, without long-term current use of insulin (HCC) - POCT HgB A1C is 6.2 and stable.  Will continue trulicity and improve diet and exercise  2. Essential hypertension Stable, continue current medications  3. Abnormal mammogram Will call to schedule f/u mammogram  4. Mixed hyperlipidemia Will continue lipitor and update labs - atorvastatin (LIPITOR) 40 MG tablet; Take 1 tablet (40 mg total) by mouth daily.  Dispense: 90 tablet; Refill: 1 - Lipid Panel With LDL/HDL Ratio  5. Other fatigue - CBC w/Diff/Platelet - Comprehensive metabolic panel - TSH + free T4   General Counseling: Serene verbalizes understanding of the findings of todays visit and agrees with plan  of treatment. I have discussed any further diagnostic evaluation that may be needed or ordered today. We also reviewed her medications today. she has been encouraged to call the office with any questions or concerns that should arise related to todays visit.    Orders Placed This Encounter  Procedures   CBC w/Diff/Platelet   Comprehensive metabolic panel   TSH + free T4   Lipid Panel With LDL/HDL Ratio   POCT HgB A1C    Meds ordered this encounter  Medications   atorvastatin (LIPITOR) 40 MG tablet    Sig: Take 1 tablet (40 mg total) by mouth daily.    Dispense:  90 tablet    Refill:  1    This patient was seen by Lynn Ito, PA-C in collaboration with Dr. Beverely Risen as a part of collaborative care agreement.   Total time spent:30 Minutes Time spent includes review of chart, medications, test results, and follow up plan with the patient.      Dr Lyndon Code Internal medicine

## 2021-06-09 ENCOUNTER — Other Ambulatory Visit: Payer: BC Managed Care – PPO | Admitting: Physician Assistant

## 2021-06-11 IMAGING — CT CT HEAD W/O CM
3 series · 16 of 46 positions shown, 19 images · non-contrast
Comparison: December 20, 2016.

CLINICAL DATA: Headache. Weakness.

EXAM:
CT HEAD WITHOUT CONTRAST
TECHNIQUE: Contiguous axial images were obtained from the base of the skull
through the vertex without intravenous contrast.

[Series 2: head wo · axial · 0.42mm/px · z∈[+231,+351]mm · 10 of 29 slices shown, 13 images]
[im 3/29  brain]
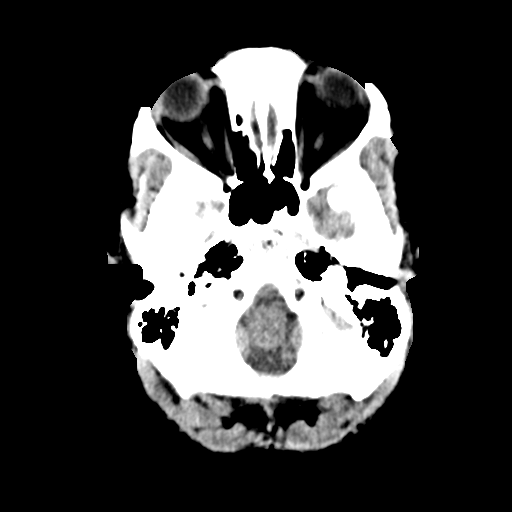
[im 3/29  bone]
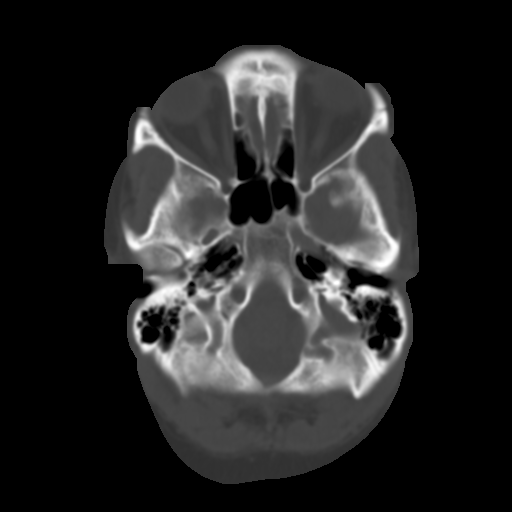
[im 6/29  brain]
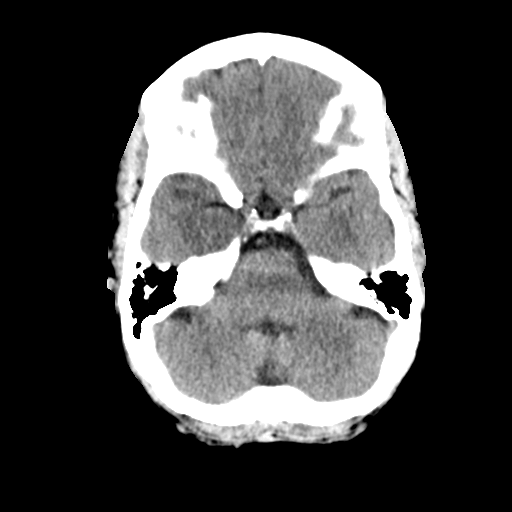
[im 8/29  brain]
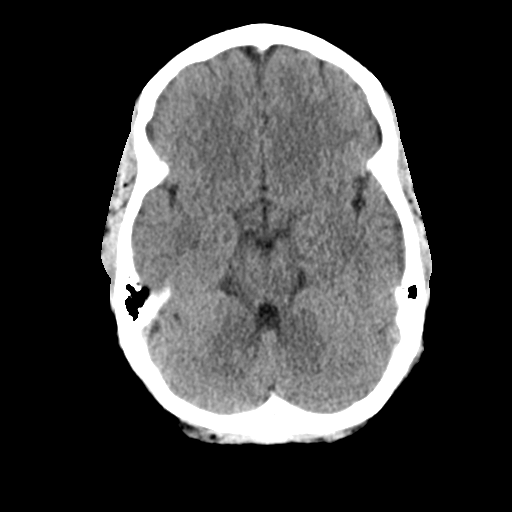
[im 11/29  brain]
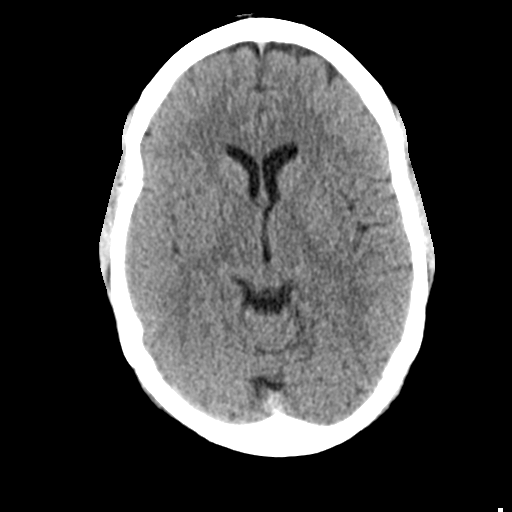
[im 14/29  brain]
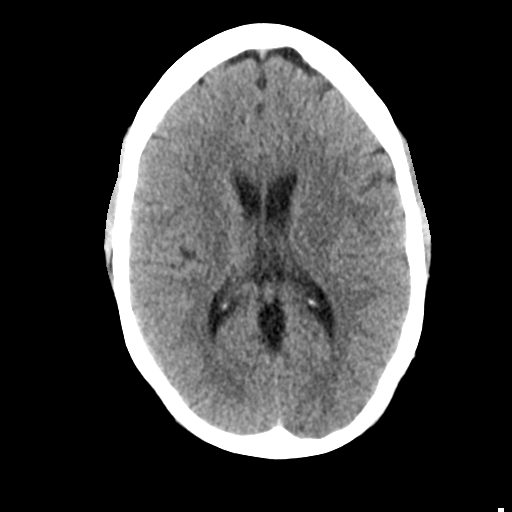
[im 14/29  bone]
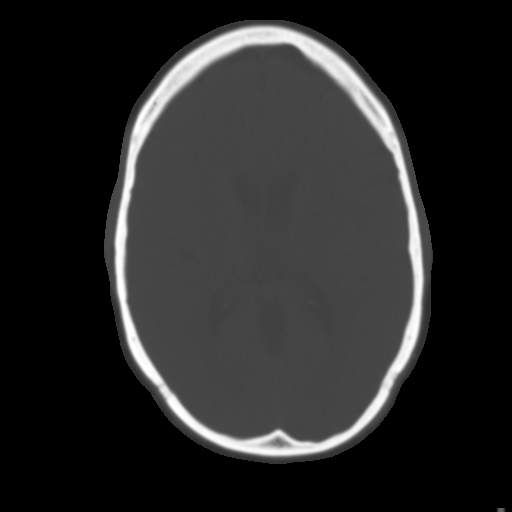
[im 16/29  brain]
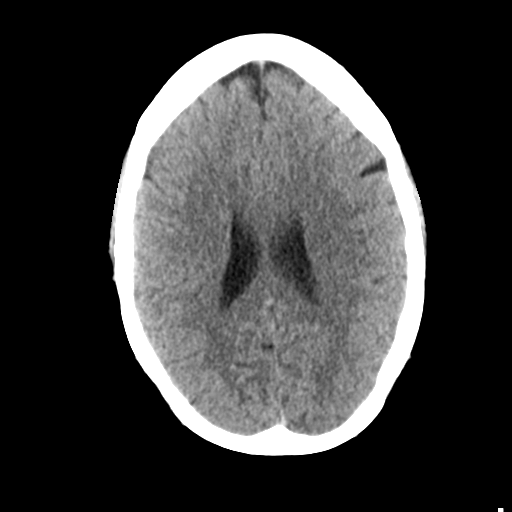
[im 19/29  brain]
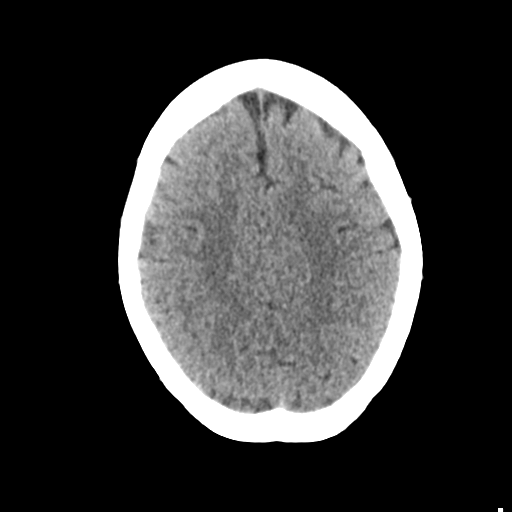
[im 22/29  brain]
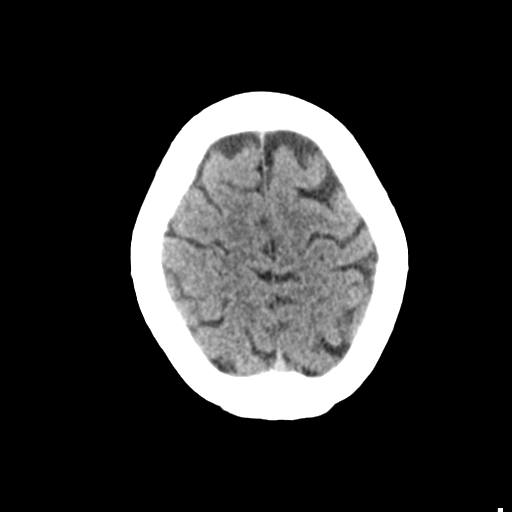
[im 24/29  brain]
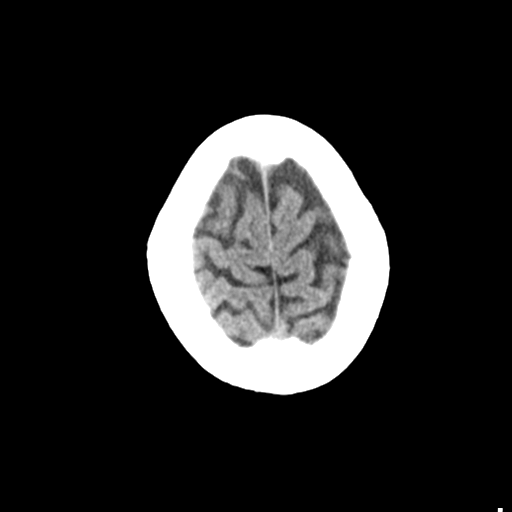
[im 24/29  bone]
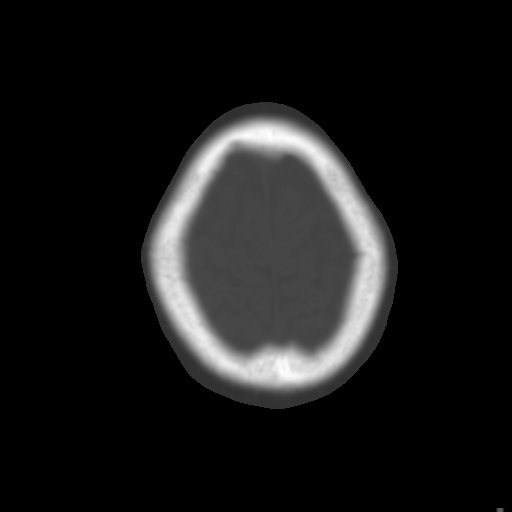
[im 27/29  brain]
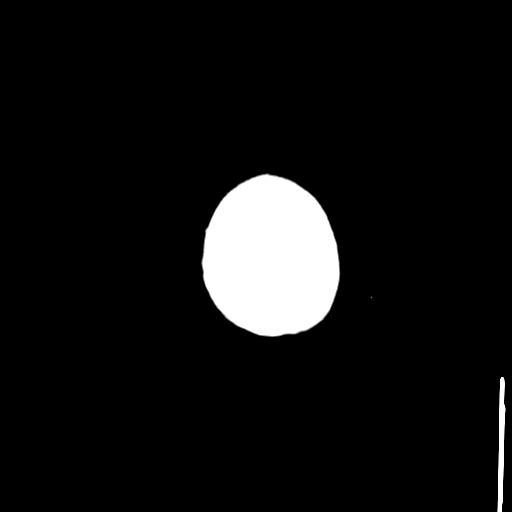

[Series 4: coronal soft tissue · coronal · 0.29mm/px · 3 of 63 slices shown]
[im 21/63  brain]
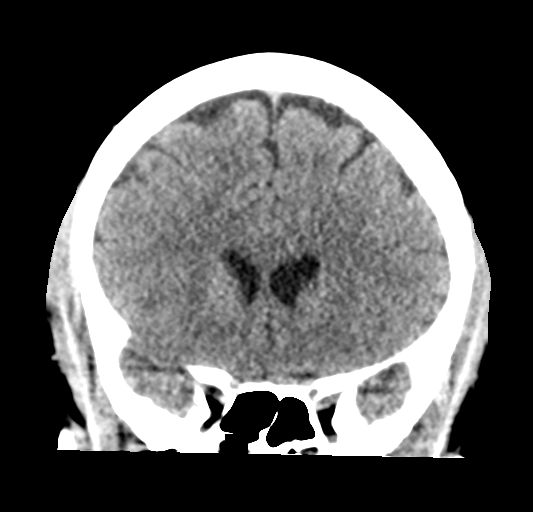
[im 28/63  brain]
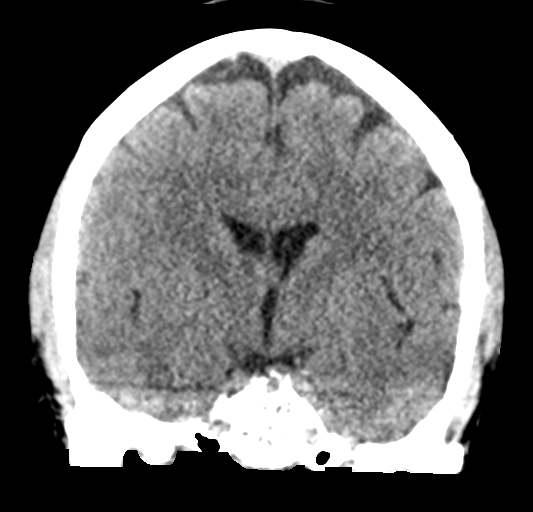
[im 35/63  brain]
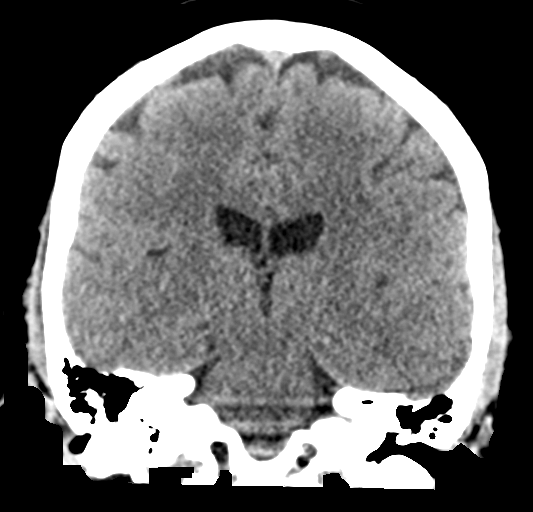

[Series 5: sagittal soft tissue · sagittal · 0.31mm/px · 3 of 47 slices shown]
[im 16/47  brain]
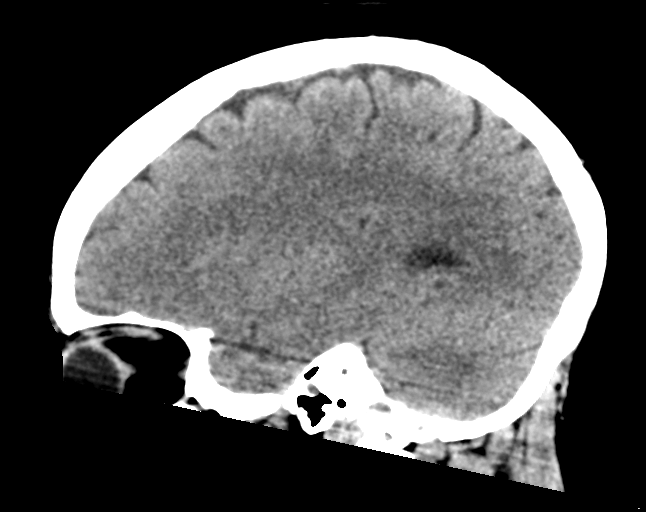
[im 24/47  brain]
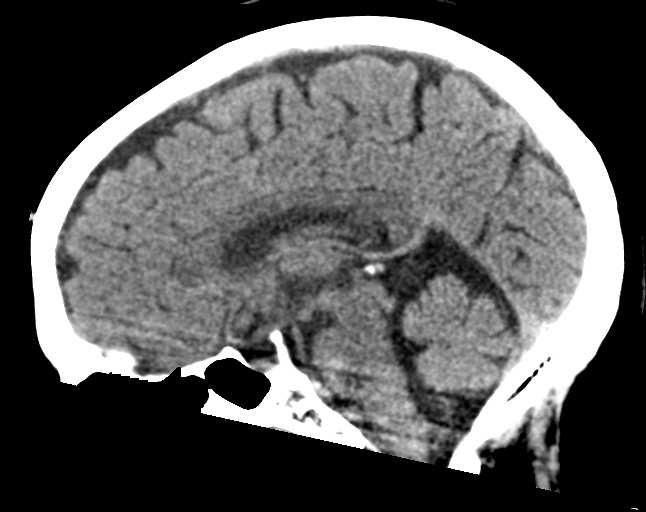
[im 31/47  brain]
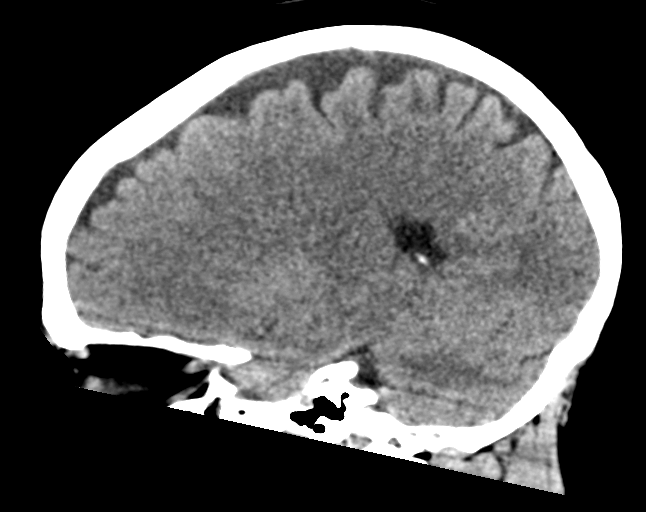

[16 of 46 positions shown; findings below may reference images not displayed]

FINDINGS: Brain: No evidence of acute infarction, hemorrhage, hydrocephalus,
extra-axial collection or mass lesion/mass effect.

Vascular: No hyperdense vessel or unexpected calcification.

Skull: Normal. Negative for fracture or focal lesion.

Sinuses/Orbits: No acute finding.

Other: None.
IMPRESSION: No acute intracranial pathology.

## 2021-06-28 ENCOUNTER — Telehealth: Payer: Self-pay

## 2021-06-28 DIAGNOSIS — R5383 Other fatigue: Secondary | ICD-10-CM | POA: Diagnosis not present

## 2021-06-28 DIAGNOSIS — E782 Mixed hyperlipidemia: Secondary | ICD-10-CM | POA: Diagnosis not present

## 2021-06-28 NOTE — Telephone Encounter (Signed)
Left vm and sent mychart message to confirm 06/30/21 appointment-Toni

## 2021-06-29 LAB — LIPID PANEL WITH LDL/HDL RATIO
Cholesterol, Total: 190 mg/dL (ref 100–199)
HDL: 42 mg/dL (ref >39)
LDL Chol Calc (NIH): 126 mg/dL — ABNORMAL HIGH (ref 0–99)
LDL/HDL Ratio: 3 ratio (ref 0.0–3.2)
Triglycerides: 122 mg/dL (ref 0–149)
VLDL Cholesterol Cal: 22 mg/dL (ref 5–40)

## 2021-06-29 LAB — CBC WITH DIFFERENTIAL/PLATELET
Basophils Absolute: 0 10*3/uL (ref 0.0–0.2)
Basos: 0 %
EOS (ABSOLUTE): 0.1 10*3/uL (ref 0.0–0.4)
Eos: 3 %
Hematocrit: 40.7 % (ref 34.0–46.6)
Hemoglobin: 13.3 g/dL (ref 11.1–15.9)
Immature Grans (Abs): 0 10*3/uL (ref 0.0–0.1)
Immature Granulocytes: 0 %
Lymphocytes Absolute: 2.2 10*3/uL (ref 0.7–3.1)
Lymphs: 39 %
MCH: 29.7 pg (ref 26.6–33.0)
MCHC: 32.7 g/dL (ref 31.5–35.7)
MCV: 91 fL (ref 79–97)
Monocytes Absolute: 0.6 10*3/uL (ref 0.1–0.9)
Monocytes: 10 %
Neutrophils Absolute: 2.6 10*3/uL (ref 1.4–7.0)
Neutrophils: 48 %
Platelets: 229 10*3/uL (ref 150–450)
RBC: 4.48 x10E6/uL (ref 3.77–5.28)
RDW: 12.9 % (ref 11.7–15.4)
WBC: 5.6 10*3/uL (ref 3.4–10.8)

## 2021-06-29 LAB — COMPREHENSIVE METABOLIC PANEL
ALT: 16 IU/L (ref 0–32)
AST: 18 IU/L (ref 0–40)
Albumin/Globulin Ratio: 1.4 (ref 1.2–2.2)
Albumin: 4.1 g/dL (ref 3.8–4.9)
Alkaline Phosphatase: 79 IU/L (ref 44–121)
BUN/Creatinine Ratio: 18 (ref 9–23)
BUN: 16 mg/dL (ref 6–24)
Bilirubin Total: 0.2 mg/dL (ref 0.0–1.2)
CO2: 26 mmol/L (ref 20–29)
Calcium: 9.7 mg/dL (ref 8.7–10.2)
Chloride: 103 mmol/L (ref 96–106)
Creatinine, Ser: 0.89 mg/dL (ref 0.57–1.00)
Globulin, Total: 3 g/dL (ref 1.5–4.5)
Glucose: 113 mg/dL — ABNORMAL HIGH (ref 70–99)
Potassium: 4.3 mmol/L (ref 3.5–5.2)
Sodium: 144 mmol/L (ref 134–144)
Total Protein: 7.1 g/dL (ref 6.0–8.5)
eGFR: 77 mL/min/{1.73_m2} (ref >59)

## 2021-06-29 LAB — TSH+FREE T4
Free T4: 1.3 ng/dL (ref 0.82–1.77)
TSH: 1.06 u[IU]/mL (ref 0.450–4.500)

## 2021-06-30 ENCOUNTER — Telehealth: Payer: Self-pay

## 2021-06-30 ENCOUNTER — Other Ambulatory Visit: Payer: Self-pay

## 2021-06-30 ENCOUNTER — Ambulatory Visit (INDEPENDENT_AMBULATORY_CARE_PROVIDER_SITE_OTHER): Payer: BC Managed Care – PPO | Admitting: Physician Assistant

## 2021-06-30 ENCOUNTER — Encounter: Payer: Self-pay | Admitting: Physician Assistant

## 2021-06-30 VITALS — BP 123/77 | HR 70 | Temp 98.0°F | Resp 16 | Ht 66.0 in | Wt 238.4 lb

## 2021-06-30 DIAGNOSIS — Z1211 Encounter for screening for malignant neoplasm of colon: Secondary | ICD-10-CM

## 2021-06-30 DIAGNOSIS — E119 Type 2 diabetes mellitus without complications: Secondary | ICD-10-CM | POA: Diagnosis not present

## 2021-06-30 DIAGNOSIS — Z0001 Encounter for general adult medical examination with abnormal findings: Secondary | ICD-10-CM | POA: Diagnosis not present

## 2021-06-30 DIAGNOSIS — Z124 Encounter for screening for malignant neoplasm of cervix: Secondary | ICD-10-CM

## 2021-06-30 DIAGNOSIS — R300 Dysuria: Secondary | ICD-10-CM | POA: Diagnosis not present

## 2021-06-30 DIAGNOSIS — Z01419 Encounter for gynecological examination (general) (routine) without abnormal findings: Secondary | ICD-10-CM

## 2021-06-30 DIAGNOSIS — Z1212 Encounter for screening for malignant neoplasm of rectum: Secondary | ICD-10-CM

## 2021-06-30 DIAGNOSIS — I10 Essential (primary) hypertension: Secondary | ICD-10-CM

## 2021-06-30 DIAGNOSIS — R928 Other abnormal and inconclusive findings on diagnostic imaging of breast: Secondary | ICD-10-CM

## 2021-06-30 DIAGNOSIS — E782 Mixed hyperlipidemia: Secondary | ICD-10-CM

## 2021-06-30 DIAGNOSIS — R590 Localized enlarged lymph nodes: Secondary | ICD-10-CM

## 2021-06-30 NOTE — Progress Notes (Signed)
[facility] [place], [place] [address]  Internal MEDICINE  Office Visit Note  Patient Name: Lindsey Cruz  [id number]  [id number]  Date of Service: 07/08/2021  Chief Complaint  Patient presents with   Annual Exam   Diabetes   Hypertension   Hyperlipidemia     HPI Pt is here for routine health maintenance examination -Continues to stay busy with kids, daughter is a Equities trader and applying for colleges. -Will call about mammogram, she does have what feels like an enlarged lymph node in right axilla, therefore will order [place] as well -Due for colonoscopy and will refer to GI -Will schedule eye exam -BG 110-115, not yet due for repeat A1c -Sleeping well -BP well controlled -Labs reviewed--cholesterol stable and will continue lipitor as before and continue to improve diet and exercise, otherwise labs look good  Current Medication: Outpatient Encounter Medications as of 06/30/2021  Medication Sig   Accu-Chek FastClix Lancets MISC Use as directed dx e11.65   atorvastatin (LIPITOR) 40 MG tablet Take 1 tablet (40 mg total) by mouth daily.   bisoprolol-hydrochlorothiazide (ZIAC) 2.5-6.25 MG tablet Take 1 tablet by mouth daily.   Dulaglutide (TRULICITY) 1.5 XB/3.5HG SOPN INJECT SUBCUTANEOUSLY 1.5MG ONCE WEEKLY   glucose blood (ACCU-CHEK GUIDE) test strip TEST BLOOD SUGAR EVERY DAY AS DIRECTED   hydrochlorothiazide (MICROZIDE) 12.5 MG capsule TAKE 1 CAPSULE BY MOUTH  DAILY   Multiple Vitamins-Minerals (EMERGEN-C VITAMIN D/CALCIUM PO) Take by mouth.   SUMAtriptan (IMITREX) 100 MG tablet TAKE 1 TABLET BY MOUTH EVERY 2 HOURS. DO NOT EXCEED 2 DOSES PER 24 HOURS   No facility-administered encounter medications on file as of 06/30/2021.    Surgical History: Past Surgical History:  Procedure Laterality Date   BREAST CYST ASPIRATION Right 2014   neg   BREAST EXCISIONAL BIOPSY Right 1983   neg   TUBAL LIGATION      Medical History: Past Medical History:   Diagnosis Date   Diabetes mellitus without complication ([place])    Hypercholesteremia    Hypertension     Family History: Family History  Problem Relation Age of Onset   Hypertension Mother    Hyperlipidemia Mother    Diabetes Mother    Diabetes Maternal Grandmother       Review of Systems  Constitutional:  Positive for fatigue. Negative for chills and unexpected weight change.  HENT:  Negative for congestion, postnasal drip, rhinorrhea, sneezing and sore throat.   Eyes:  Negative for redness.  Respiratory:  Negative for cough, chest tightness and shortness of breath.   Cardiovascular:  Negative for chest pain and palpitations.  Gastrointestinal:  Negative for abdominal pain, constipation, diarrhea, nausea and vomiting.  Genitourinary:  Negative for dysuria and frequency.  Musculoskeletal:  Positive for arthralgias. Negative for back pain, joint swelling and neck pain.  Skin:  Negative for rash.  Neurological: Negative.  Negative for tremors and numbness.  Hematological:  Negative for adenopathy. Does not bruise/bleed easily.  Psychiatric/Behavioral:  Negative for behavioral problems (Depression), sleep disturbance and suicidal ideas. The patient is not nervous/anxious.     Vital Signs: BP 123/77    Pulse 70    Temp 98 F (36.7 C)    Resp 16    Ht 5' 6"  (1.676 m)    Wt 238 lb 6.4 oz (108.1 kg)    SpO2 98%    BMI 38.48 kg/m    Physical Exam Vitals and nursing note reviewed.  Constitutional:      General: She  is not in acute distress.    Appearance: She is well-developed. She is obese. She is not diaphoretic.  HENT:     Head: Normocephalic and atraumatic.     Mouth/Throat:     Pharynx: No oropharyngeal exudate.  Eyes:     Pupils: Pupils are equal, round, and reactive to light.  Neck:     Thyroid: No thyromegaly.     Vascular: No JVD.     Trachea: No tracheal deviation.  Cardiovascular:     Rate and Rhythm: Normal rate and regular rhythm.     Pulses:           Dorsalis pedis pulses are 3+ on the right side and 3+ on the left side.       Posterior tibial pulses are 3+ on the right side and 3+ on the left side.     Heart sounds: Normal heart sounds. No murmur heard.   No friction rub. No gallop.  Pulmonary:     Effort: Pulmonary effort is normal. No respiratory distress.     Breath sounds: No wheezing or rales.  Chest:     Chest wall: No tenderness.  Breasts:    Right: Normal. No mass.     Left: Normal. No mass.  Abdominal:     General: Bowel sounds are normal.     Palpations: Abdomen is soft.  Musculoskeletal:        General: Normal range of motion.     Cervical back: Normal range of motion and neck supple.     Right foot: Normal range of motion.     Left foot: Normal range of motion.  Feet:     Right foot:     Protective Sensation: 2 sites tested.  2 sites sensed.     Skin integrity: Skin integrity normal.     Toenail Condition: Right toenails are normal.     Left foot:     Protective Sensation: 2 sites tested.  2 sites sensed.     Skin integrity: Skin integrity normal.     Toenail Condition: Left toenails are normal.  Lymphadenopathy:     Cervical: No cervical adenopathy.     Upper Body:     Right upper body: Axillary adenopathy present.     Left upper body: No axillary adenopathy.  Skin:    General: Skin is warm and dry.  Neurological:     Mental Status: She is alert and oriented to person, place, and time.     Cranial Nerves: No cranial nerve deficit.     Sensory: No sensory deficit.  Psychiatric:        Behavior: Behavior normal.        Thought Content: Thought content normal.        Judgment: Judgment normal.     LABS: Recent Results (from the past 2160 hour(s))  POCT HgB A1C     Status: Abnormal   Collection Time: 04/21/21  3:06 PM  Result Value Ref Range   Hemoglobin A1C 6.2 (A) 4.0 - 5.6 %   HbA1c POC (<> result, manual entry)     HbA1c, POC (prediabetic range)     HbA1c, POC (controlled diabetic range)     CBC w/Diff/Platelet     Status: None   Collection Time: 06/28/21  7:08 AM  Result Value Ref Range   WBC 5.6 3.4 - 10.8 x10E3/uL   RBC 4.48 3.77 - 5.28 x10E6/uL   Hemoglobin 13.3 11.1 - 15.9 g/dL   Hematocrit 40.7  34.0 - 46.6 %   MCV 91 79 - 97 fL   MCH 29.7 26.6 - 33.0 pg   MCHC 32.7 31.5 - 35.7 g/dL   RDW 12.9 11.7 - 15.4 %   Platelets 229 150 - 450 x10E3/uL   Neutrophils 48 Not Estab. %   Lymphs 39 Not Estab. %   Monocytes 10 Not Estab. %   Eos 3 Not Estab. %   Basos 0 Not Estab. %   Neutrophils Absolute 2.6 1.4 - 7.0 x10E3/uL   Lymphocytes Absolute 2.2 0.7 - 3.1 x10E3/uL   Monocytes Absolute 0.6 0.1 - 0.9 x10E3/uL   EOS (ABSOLUTE) 0.1 0.0 - 0.4 x10E3/uL   Basophils Absolute 0.0 0.0 - 0.2 x10E3/uL   Immature Granulocytes 0 Not Estab. %   Immature Grans (Abs) 0.0 0.0 - 0.1 x10E3/uL  Comprehensive metabolic panel     Status: Abnormal   Collection Time: 06/28/21  7:08 AM  Result Value Ref Range   Glucose 113 (H) 70 - 99 mg/dL   BUN 16 6 - 24 mg/dL   Creatinine, Ser 0.89 0.57 - 1.00 mg/dL   eGFR 77 >59 mL/min/1.73   BUN/Creatinine Ratio 18 9 - 23   Sodium 144 134 - 144 mmol/L   Potassium 4.3 3.5 - 5.2 mmol/L   Chloride 103 96 - 106 mmol/L   CO2 26 20 - 29 mmol/L   Calcium 9.7 8.7 - 10.2 mg/dL   Total Protein 7.1 6.0 - 8.5 g/dL   Albumin 4.1 3.8 - 4.9 g/dL   Globulin, Total 3.0 1.5 - 4.5 g/dL   Albumin/Globulin Ratio 1.4 1.2 - 2.2   Bilirubin Total 0.2 0.0 - 1.2 mg/dL   Alkaline Phosphatase 79 44 - 121 IU/L   AST 18 0 - 40 IU/L   ALT 16 0 - 32 IU/L  TSH + free T4     Status: None   Collection Time: 06/28/21  7:08 AM  Result Value Ref Range   TSH 1.060 0.450 - 4.500 uIU/mL   Free T4 1.30 0.82 - 1.77 ng/dL  Lipid Panel With LDL/HDL Ratio     Status: Abnormal   Collection Time: 06/28/21  7:08 AM  Result Value Ref Range   Cholesterol, Total 190 100 - 199 mg/dL   Triglycerides 122 0 - 149 mg/dL   HDL 42 >39 mg/dL   VLDL Cholesterol Cal 22 5 - 40 mg/dL   LDL Chol  Calc (NIH) 126 (H) 0 - 99 mg/dL   LDL/HDL Ratio 3.0 0.0 - 3.2 ratio    Comment:                                     LDL/HDL Ratio                                             Men  Women                               1/2 Avg.Risk  1.0    1.5                                   Avg.Risk  3.6  3.2                                2X Avg.Risk  6.2    5.0                                3X Avg.Risk  8.0    6.1   UA/M w/rflx Culture, Routine     Status: Abnormal   Collection Time: 06/30/21  3:42 PM   Specimen: Urine   Urine  Result Value Ref Range   Specific Gravity, UA 1.021 1.005 - 1.030   pH, UA 5.0 5.0 - 7.5   Color, UA Yellow Yellow   Appearance Ur Clear Clear   Leukocytes,UA Negative Negative   Protein,UA Negative Negative/Trace   Glucose, UA Negative Negative   Ketones, UA Negative Negative   RBC, UA Trace (A) Negative   Bilirubin, UA Negative Negative   Urobilinogen, Ur 0.2 0.2 - 1.0 mg/dL   Nitrite, UA Negative Negative   Microscopic Examination See below:     Comment: Microscopic was indicated and was performed.   Urinalysis Reflex Comment     Comment: This specimen will not reflex to a Urine Culture.  Microscopic Examination     Status: None   Collection Time: 06/30/21  3:42 PM   Urine  Result Value Ref Range   WBC, UA 0-5 0 - 5 /hpf   RBC 0-2 0 - 2 /hpf   Epithelial Cells (non renal) 0-10 0 - 10 /hpf   Casts None seen None seen /lpf   Bacteria, UA Few None seen/Few        Assessment/Plan: 1. Encounter for general adult medical examination with abnormal findings CPE performed, routine fasting labs reviewed, mammogram will be updated and patient referred for colonoscopy.  Up-to-date on Pap  2. Type 2 diabetes mellitus without complication, without long-term current use of insulin (HCC) Stable, continue current medication and monitoring.  Not yet due for A1c and will continue to work on improving diet and exercise  3. Essential hypertension Stable, continue current  medications  4. Mixed hyperlipidemia Stable, continue Lipitor  5. Abnormal mammogram Will update mammogram  6. Enlarged lymph nodes in armpit Will order ultrasound in addition to updated diagnostic mammogram to evaluate further - US BREAST LTD UNI RIGHT INC AXILLA; Future  7. Screening for colorectal cancer - Ambulatory referral to Gastroenterology  8. Dysuria - UA/M w/rflx Culture, Routine  9. Visit for gynecologic examination Breast exam performed  General Counseling: latavia goga understanding of the findings of todays visit and agrees with plan of treatment. I have discussed any further diagnostic evaluation that may be needed or ordered today. We also reviewed her medications today. she has been encouraged to call the office with any questions or concerns that should arise related to todays visit.    Counseling:    Orders Placed This Encounter  Procedures   Microscopic Examination   US BREAST LTD UNI RIGHT INC AXILLA   UA/M w/rflx Culture, Routine   Ambulatory referral to Gastroenterology    No orders of the defined types were placed in this encounter.   This patient was seen by Drema Dallas, PA-C in collaboration with Dr. Clayborn Bigness as a part of collaborative care agreement.  Total time spent:30 Minutes  Time spent includes review of chart, medications, test results, and follow up plan with  the patient.     Lavera Guise, MD  Internal Medicine

## 2021-06-30 NOTE — Telephone Encounter (Signed)
CALLED PATIENT NO ANSWER LEFT VOICEMAIL FOR A CALL BACK ? ?

## 2021-07-01 ENCOUNTER — Other Ambulatory Visit: Payer: Self-pay

## 2021-07-01 DIAGNOSIS — Z86010 Personal history of colonic polyps: Secondary | ICD-10-CM

## 2021-07-01 LAB — MICROSCOPIC EXAMINATION: Casts: NONE SEEN /lpf

## 2021-07-01 LAB — UA/M W/RFLX CULTURE, ROUTINE
Bilirubin, UA: NEGATIVE
Glucose, UA: NEGATIVE
Ketones, UA: NEGATIVE
Leukocytes,UA: NEGATIVE
Nitrite, UA: NEGATIVE
Protein,UA: NEGATIVE
Specific Gravity, UA: 1.021 (ref 1.005–1.030)
Urobilinogen, Ur: 0.2 mg/dL (ref 0.2–1.0)
pH, UA: 5 (ref 5.0–7.5)

## 2021-07-01 MED ORDER — PEG 3350-KCL-NA BICARB-NACL 420 G PO SOLR: 4000.0000 mL | Freq: Once | ORAL | 0 refills | Status: AC

## 2021-07-01 NOTE — Progress Notes (Signed)
Gastroenterology Pre-Procedure Review  Request Date: 08/05/2021 Requesting Physician: Dr. Marius Ditch  PATIENT REVIEW QUESTIONS: The patient responded to the following health history questions as indicated:    1. Are you having any GI issues? no 2. Do you have a personal history of Polyps? yes (last colonoscopyy) 3. Do you have a family history of Colon Cancer or Polyps? yes (only polyps) 4. Diabetes Mellitus? no 5. Joint replacements in the past 12 months?no 6. Major health problems in the past 3 months?no 7. Any artificial heart valves, MVP, or defibrillator?no    MEDICATIONS & ALLERGIES:    Patient reports the following regarding taking any anticoagulation/antiplatelet therapy:   Plavix, Coumadin, Eliquis, Xarelto, Lovenox, Pradaxa, Brilinta, or Effient? no Aspirin? no  Patient confirms/reports the following medications:  Current Outpatient Medications  Medication Sig Dispense Refill   Accu-Chek FastClix Lancets MISC Use as directed dx e11.65 102 each 11   atorvastatin (LIPITOR) 40 MG tablet Take 1 tablet (40 mg total) by mouth daily. 90 tablet 1   bisoprolol-hydrochlorothiazide (ZIAC) 2.5-6.25 MG tablet Take 1 tablet by mouth daily. 90 tablet 1   Dulaglutide (TRULICITY) 1.5 0000000 SOPN INJECT SUBCUTANEOUSLY 1.5MG  ONCE WEEKLY 6 mL 3   glucose blood (ACCU-CHEK GUIDE) test strip TEST BLOOD SUGAR EVERY DAY AS DIRECTED 100 strip 3   hydrochlorothiazide (MICROZIDE) 12.5 MG capsule TAKE 1 CAPSULE BY MOUTH  DAILY 90 capsule 3   Multiple Vitamins-Minerals (EMERGEN-C VITAMIN D/CALCIUM PO) Take by mouth.     SUMAtriptan (IMITREX) 100 MG tablet TAKE 1 TABLET BY MOUTH EVERY 2 HOURS. DO NOT EXCEED 2 DOSES PER 24 HOURS     No current facility-administered medications for this visit.    Patient confirms/reports the following allergies:  No Known Allergies  No orders of the defined types were placed in this encounter.   AUTHORIZATION INFORMATION Primary Insurance: 1D#: Group  #:  Secondary Insurance: 1D#: Group #:  SCHEDULE INFORMATION: Date: 08/05/2021 Time: Location: [facility]

## 2021-07-07 DIAGNOSIS — L304 Erythema intertrigo: Secondary | ICD-10-CM | POA: Diagnosis not present

## 2021-08-05 ENCOUNTER — Ambulatory Visit
Admission: RE | Admit: 2021-08-05 | Discharge: 2021-08-05 | Disposition: A | Discharge status: Home / Self Care | Payer: BC Managed Care – PPO | Attending: Gastroenterology | Admitting: Gastroenterology

## 2021-08-05 ENCOUNTER — Other Ambulatory Visit: Payer: Self-pay

## 2021-08-05 ENCOUNTER — Encounter
Admission: RE | Disposition: A | Discharge status: Home / Self Care | Payer: Self-pay | Source: Home / Self Care | Attending: Gastroenterology

## 2021-08-05 ENCOUNTER — Ambulatory Visit: Payer: BC Managed Care – PPO | Admitting: Anesthesiology

## 2021-08-05 ENCOUNTER — Encounter: Payer: Self-pay | Admitting: Gastroenterology

## 2021-08-05 DIAGNOSIS — Z1211 Encounter for screening for malignant neoplasm of colon: Secondary | ICD-10-CM | POA: Diagnosis not present

## 2021-08-05 DIAGNOSIS — Z860100 Personal history of colon polyps, unspecified: Secondary | ICD-10-CM

## 2021-08-05 DIAGNOSIS — E119 Type 2 diabetes mellitus without complications: Secondary | ICD-10-CM | POA: Insufficient documentation

## 2021-08-05 DIAGNOSIS — I10 Essential (primary) hypertension: Secondary | ICD-10-CM | POA: Insufficient documentation

## 2021-08-05 DIAGNOSIS — E7800 Pure hypercholesterolemia, unspecified: Secondary | ICD-10-CM | POA: Insufficient documentation

## 2021-08-05 HISTORY — PX: COLONOSCOPY WITH PROPOFOL: SHX5780

## 2021-08-05 LAB — GLUCOSE, CAPILLARY: Glucose-Capillary: 99 mg/dL (ref 70–99)

## 2021-08-05 LAB — POCT PREGNANCY, URINE: Preg Test, Ur: NEGATIVE

## 2021-08-05 SURGERY — COLONOSCOPY WITH PROPOFOL
Anesthesia: General

## 2021-08-05 MED ORDER — PROPOFOL 500 MG/50ML IV EMUL
INTRAVENOUS | Status: DC | PRN
  Administered 2021-08-05: 200 ug/kg/min via INTRAVENOUS

## 2021-08-05 MED ORDER — PROPOFOL 10 MG/ML IV BOLUS
INTRAVENOUS | Status: DC | PRN
  Administered 2021-08-05: 60 mg via INTRAVENOUS

## 2021-08-05 MED ORDER — SODIUM CHLORIDE 0.9 % IV SOLN: INTRAVENOUS | Status: DC

## 2021-08-05 NOTE — Op Note (Signed)
[facility] Gastroenterology Patient Name: Lindsey Cruz Procedure Date: 08/05/2021 9:13 AM MRN: [id number] Account #: [id number] Date of Birth: 1965/06/16 Admit Type: Outpatient Age: 56 Room: [facility] ENDO ROOM 2 Gender: Female Note Status: Finalized Instrument Name: Prentice Docker [id number] Procedure:             Colonoscopy Indications:           High risk colon cancer surveillance: Personal history                         of colonic polyps, Last colonoscopy 10 years ago Providers:             Toney Reil MD, MD Referring MD:          Lyndon Code, MD (Referring MD) Medicines:             General Anesthesia Complications:         No immediate complications. Estimated blood loss: None. Procedure:             Pre-Anesthesia Assessment:                        - Prior to the procedure, a History and Physical was                         performed, and patient medications and allergies were                         reviewed. The patient is competent. The risks and                         benefits of the procedure and the sedation options and                         risks were discussed with the patient. All questions                         were answered and informed consent was obtained.                         Patient identification and proposed procedure were                         verified by the physician, the nurse, the                         anesthesiologist, the anesthetist and the technician                         in the pre-procedure area in the procedure room in the                         endoscopy suite. Mental Status Examination: alert and                         oriented. Airway Examination: normal oropharyngeal                         airway and neck mobility. Respiratory Examination:  clear to auscultation. CV Examination: normal.                         Prophylactic Antibiotics: The patient does not require                          prophylactic antibiotics. Prior Anticoagulants: The                         patient has taken no previous anticoagulant or                         antiplatelet agents. ASA Grade Assessment: III - A                         patient with severe systemic disease. After reviewing                         the risks and benefits, the patient was deemed in                         satisfactory condition to undergo the procedure. The                         anesthesia plan was to use general anesthesia.                         Immediately prior to administration of medications,                         the patient was re-assessed for adequacy to receive                         sedatives. The heart rate, respiratory rate, oxygen                         saturations, blood pressure, adequacy of pulmonary                         ventilation, and response to care were monitored                         throughout the procedure. The physical status of the                         patient was re-assessed after the procedure.                        After obtaining informed consent, the colonoscope was                         passed under direct vision. Throughout the procedure,                         the patient's blood pressure, pulse, and oxygen                         saturations were monitored continuously. The  Colonoscope was introduced through the anus and                         advanced to the the cecum, identified by appendiceal                         orifice and ileocecal valve. The colonoscopy was                         performed without difficulty. The patient tolerated                         the procedure well. The quality of the bowel                         preparation was evaluated using the BBPS [facility] Bowel                         Preparation Scale) with scores of: Right Colon = 3,                         Transverse Colon = 3 and Left Colon = 3 (entire mucosa                          seen well with no residual staining, small fragments                         of stool or opaque liquid). The total BBPS score                         equals 9. Findings:      The perianal and digital rectal examinations were normal. Pertinent       negatives include normal sphincter tone and no palpable rectal lesions.      The entire examined colon appeared normal.      The retroflexed view of the distal rectum and anal verge was normal and       showed no anal or rectal abnormalities.      The terminal ileum appeared normal. Impression:            - The entire examined colon is normal.                        - The distal rectum and anal verge are normal on                         retroflexion view.                        - The examined portion of the ileum was normal.                        - No specimens collected. Recommendation:        - Discharge patient to home (with escort).                        - Resume previous diet today.                        -  Continue present medications.                        - Repeat colonoscopy in 10 years for screening                         purposes. Procedure Code(s):     --- Professional ---                        J6967, Colorectal cancer screening; colonoscopy on                         individual at high risk Diagnosis Code(s):     --- Professional ---                        Z86.010, Personal history of colonic polyps CPT copyright 2019 American Medical Association. All rights reserved. The codes documented in this report are preliminary and upon coder review may  be revised to meet current compliance requirements. Dr. Libby Maw Toney Reil MD, MD 08/05/2021 9:41:17 AM This report has been signed electronically. Number of Addenda: 0 Note Initiated On: 08/05/2021 9:13 AM Scope Withdrawal Time: 0 hours 12 minutes 19 seconds  Total Procedure Duration: 0 hours 16 minutes 30 seconds  Estimated Blood Loss:   Estimated blood loss: none. Estimated blood loss: none.      [facility]

## 2021-08-05 NOTE — Anesthesia Postprocedure Evaluation (Signed)
Anesthesia Post Note  Patient: Lindsey Cruz  Procedure(s) Performed: COLONOSCOPY WITH PROPOFOL  Patient location during evaluation: Endoscopy Anesthesia Type: General Level of consciousness: awake and alert Pain management: pain level controlled Vital Signs Assessment: post-procedure vital signs reviewed and stable Respiratory status: spontaneous breathing, nonlabored ventilation, respiratory function stable and patient connected to nasal cannula oxygen Cardiovascular status: blood pressure returned to baseline and stable Postop Assessment: no apparent nausea or vomiting Anesthetic complications: no   No notable events documented.   Last Vitals:  Vitals:   08/05/21 0808 08/05/21 0942  BP: 129/80 109/70  Pulse: 88 82  Resp: 20 19  Temp: (!) 35.7 C (!) 35.8 C  SpO2: 100% 97%    Last Pain:  Vitals:   08/05/21 1002  TempSrc:   PainSc: 0-No pain                 Cleda Mccreedy Chantal Worthey

## 2021-08-05 NOTE — Anesthesia Procedure Notes (Signed)
Date/Time: 08/05/2021 9:22 AM Performed by: Junious Silk, CRNA Pre-anesthesia Checklist: Patient identified, Emergency Drugs available, Suction available, Patient being monitored and Timeout performed Oxygen Delivery Method: Nasal cannula

## 2021-08-05 NOTE — Transfer of Care (Signed)
Immediate Anesthesia Transfer of Care Note  Patient: Lindsey Cruz  Procedure(s) Performed: COLONOSCOPY WITH PROPOFOL  Patient Location: PACU  Anesthesia Type:General  Level of Consciousness: sedated  Airway & Oxygen Therapy: Patient Spontanous Breathing and Patient connected to nasal cannula oxygen  Post-op Assessment: Report given to RN and Post -op Vital signs reviewed and stable  Post vital signs: Reviewed and stable  Last Vitals:  Vitals Value Taken Time  BP 109/70 08/05/21 0942  Temp 35.8 C 08/05/21 0942  Pulse 81 08/05/21 0942  Resp 19 08/05/21 0942  SpO2 97 % 08/05/21 0942  Vitals shown include unvalidated device data.  Last Pain:  Vitals:   08/05/21 0942  TempSrc: Temporal  PainSc: Asleep         Complications: No notable events documented.

## 2021-08-05 NOTE — Anesthesia Preprocedure Evaluation (Signed)
Anesthesia Evaluation  Patient identified by MRN, date of birth, ID band Patient awake    Reviewed: Allergy & Precautions, NPO status , Patient's Chart, lab work & pertinent test results  History of Anesthesia Complications Negative for: history of anesthetic complications  Airway Mallampati: III  TM Distance: >3 FB Neck ROM: full    Dental  (+) Chipped   Pulmonary neg pulmonary ROS, neg shortness of breath,    Pulmonary exam normal        Cardiovascular Exercise Tolerance: Good hypertension, (-) angina(-) Past MI and (-) DOE Normal cardiovascular exam     Neuro/Psych negative neurological ROS  negative psych ROS   GI/Hepatic negative GI ROS, Neg liver ROS, neg GERD  ,  Endo/Other  diabetes, Type 2  Renal/GU negative Renal ROS  negative genitourinary   Musculoskeletal   Abdominal   Peds  Hematology negative hematology ROS (+)   Anesthesia Other Findings Past Medical History: No date: Diabetes mellitus without complication (HCC) No date: Hypercholesteremia No date: Hypertension  Past Surgical History: 2014: BREAST CYST ASPIRATION; Right     Comment:  neg 1983: BREAST EXCISIONAL BIOPSY; Right     Comment:  neg No date: TUBAL LIGATION  BMI    Body Mass Index: 38.74 kg/m      Reproductive/Obstetrics negative OB ROS                             Anesthesia Physical Anesthesia Plan  ASA: 3  Anesthesia Plan: General   Post-op Pain Management:    Induction: Intravenous  PONV Risk Score and Plan: Propofol infusion and TIVA  Airway Management Planned: Natural Airway and Nasal Cannula  Additional Equipment:   Intra-op Plan:   Post-operative Plan:   Informed Consent: I have reviewed the patients History and Physical, chart, labs and discussed the procedure including the risks, benefits and alternatives for the proposed anesthesia with the patient or authorized representative  who has indicated his/her understanding and acceptance.     Dental Advisory Given  Plan Discussed with: Anesthesiologist, CRNA and Surgeon  Anesthesia Plan Comments: (Patient consented for risks of anesthesia including but not limited to:  - adverse reactions to medications - risk of airway placement if required - damage to eyes, teeth, lips or other oral mucosa - nerve damage due to positioning  - sore throat or hoarseness - Damage to heart, brain, nerves, lungs, other parts of body or loss of life  Patient voiced understanding.)        Anesthesia Quick Evaluation

## 2021-08-05 NOTE — H&P (Addendum)
Arlyss Repress, MD [address]  [address]  [place], [place] [address]  Main: [phone]  Fax: [phone] Pager: [phone]  Primary Care Physician:  Lyndon Code, MD Primary Gastroenterologist:  Dr. Arlyss Repress  Pre-Procedure History & Physical: HPI:  Lindsey Cruz is a 56 y.o. female is here for an colonoscopy.   Past Medical History:  Diagnosis Date   Diabetes mellitus without complication (HCC)    Hypercholesteremia    Hypertension     Past Surgical History:  Procedure Laterality Date   BREAST CYST ASPIRATION Right 2014   neg   BREAST EXCISIONAL BIOPSY Right 1983   neg   TUBAL LIGATION      Prior to Admission medications   Medication Sig Start Date End Date Taking? Authorizing Provider  bisoprolol-hydrochlorothiazide (ZIAC) 2.5-6.25 MG tablet Take 1 tablet by mouth daily. 03/01/21  Yes Lyndon Code, MD  hydrochlorothiazide (MICROZIDE) 12.5 MG capsule TAKE 1 CAPSULE BY MOUTH  DAILY 03/22/21  Yes Lyndon Code, MD  Accu-Chek FastClix Lancets MISC Use as directed dx e11.65 05/22/19   Johnna Acosta, NP  atorvastatin (LIPITOR) 40 MG tablet Take 1 tablet (40 mg total) by mouth daily. 04/21/21   McDonough, Salomon Fick, PA-C  Dulaglutide (TRULICITY) 1.5 MG/0.5ML SOPN INJECT SUBCUTANEOUSLY 1.5MG  ONCE WEEKLY 03/16/21   Lyndon Code, MD  glucose blood (ACCU-CHEK GUIDE) test strip TEST BLOOD SUGAR EVERY DAY AS DIRECTED 03/09/21   Lyndon Code, MD  Multiple Vitamins-Minerals (EMERGEN-C VITAMIN D/CALCIUM PO) Take by mouth.    [provider]  SUMAtriptan (IMITREX) 100 MG tablet TAKE 1 TABLET BY MOUTH EVERY 2 HOURS. DO NOT EXCEED 2 DOSES PER 24 HOURS Patient not taking: Reported on 08/05/2021 07/27/19   [provider]    Allergies as of 07/01/2021   (No Known Allergies)    Family History  Problem Relation Age of Onset   Hypertension Mother    Hyperlipidemia Mother    Diabetes Mother    Diabetes Maternal Grandmother     Social History    Socioeconomic History   Marital status: Married    Spouse name: Not on file   Number of children: Not on file   Years of education: Not on file   Highest education level: Not on file  Occupational History   Not on file  Tobacco Use   Smoking status: Never   Smokeless tobacco: Never  Vaping Use   Vaping Use: Never used  Substance and Sexual Activity   Alcohol use: Yes    Comment: occasional   Drug use: Never   Sexual activity: Not on file  Other Topics Concern   Not on file  Social History Narrative   Not on file   Social Determinants of Health   Financial Resource Strain: Not on file  Food Insecurity: Not on file  Transportation Needs: Not on file  Physical Activity: Not on file  Stress: Not on file  Social Connections: Not on file  Intimate Partner Violence: Not on file    Review of Systems: See HPI, otherwise negative ROS  Physical Exam: BP 129/80    Pulse 88    Temp (!) 96.2 F (35.7 C) (Temporal)    Resp 20    Ht 5\' 6"  (1.676 m)    Wt 108.9 kg    SpO2 100%    BMI 38.74 kg/m  General:   Alert,  pleasant and cooperative in NAD Head:  Normocephalic and atraumatic. Neck:  Supple;  no masses or thyromegaly. Lungs:  Clear throughout to auscultation.    Heart:  Regular rate and rhythm. Abdomen:  Soft, nontender and nondistended. Normal bowel sounds, without guarding, and without rebound.   Neurologic:  Alert and  oriented x4;  grossly normal neurologically.  Impression/Plan: Lindsey Cruz is here for an colonoscopy to be performed for h/o colon polyp  Risks, benefits, limitations, and alternatives regarding  colonoscopy have been reviewed with the patient.  Questions have been answered.  All parties agreeable.   Lannette Donath, MD  08/05/2021, 9:15 AM

## 2021-08-08 ENCOUNTER — Encounter: Payer: Self-pay | Admitting: Gastroenterology

## 2021-08-11 ENCOUNTER — Encounter: Payer: Self-pay | Admitting: Physician Assistant

## 2021-08-11 ENCOUNTER — Other Ambulatory Visit: Payer: Self-pay

## 2021-08-11 ENCOUNTER — Ambulatory Visit: Payer: BC Managed Care – PPO | Admitting: Physician Assistant

## 2021-08-11 VITALS — BP 108/67 | HR 71 | Temp 98.3°F | Resp 16 | Ht 66.0 in | Wt 239.8 lb

## 2021-08-11 DIAGNOSIS — E782 Mixed hyperlipidemia: Secondary | ICD-10-CM | POA: Diagnosis not present

## 2021-08-11 DIAGNOSIS — R590 Localized enlarged lymph nodes: Secondary | ICD-10-CM | POA: Diagnosis not present

## 2021-08-11 DIAGNOSIS — E119 Type 2 diabetes mellitus without complications: Secondary | ICD-10-CM | POA: Diagnosis not present

## 2021-08-11 DIAGNOSIS — I10 Essential (primary) hypertension: Secondary | ICD-10-CM | POA: Diagnosis not present

## 2021-08-11 LAB — POCT GLYCOSYLATED HEMOGLOBIN (HGB A1C): Hemoglobin A1C: 6.2 % — AB (ref 4.0–5.6)

## 2021-08-11 MED ORDER — BISOPROLOL-HYDROCHLOROTHIAZIDE 2.5-6.25 MG PO TABS: 1.0000 | ORAL_TABLET | Freq: Every day | ORAL | 1 refills | Status: DC

## 2021-08-11 NOTE — Progress Notes (Signed)
[facility] ?[address] ?[place], [place] [address] ? ?Internal MEDICINE  ?Office Visit Note ? ?Patient Name: Lindsey Cruz ? [id number]  ?[id number] ? ?Date of Service: 08/17/2021 ? ?Chief Complaint  ?Patient presents with  ? Follow-up  ? Diabetes  ? Hypertension  ? Hyperlipidemia  ? ? ?HPI ?Pt is here for routine follow up and has no acute concerns today ?-She had her colonoscopy last week and states she got the all clear and was told to return in 10years ?-mammogram scheduled for march 14, unclear why ultrasound ordered last visit was not also scheduled therefore we will go ahead and place new order as patient does feel like she has an enlarged lymph node in her right axilla ?-BG have been 115 ?-BP has been stable at home ?-planning to get back into the gym with her daughter ? ?Current Medication: ?Outpatient Encounter Medications as of 08/11/2021  ?Medication Sig  ? Accu-Chek FastClix Lancets MISC Use as directed dx e11.65  ? atorvastatin (LIPITOR) 40 MG tablet Take 1 tablet (40 mg total) by mouth daily.  ? Dulaglutide (TRULICITY) 1.5 MG/0.5ML SOPN INJECT SUBCUTANEOUSLY 1.5MG  ONCE WEEKLY  ? glucose blood (ACCU-CHEK GUIDE) test strip TEST BLOOD SUGAR EVERY DAY AS DIRECTED  ? hydrochlorothiazide (MICROZIDE) 12.5 MG capsule TAKE 1 CAPSULE BY MOUTH  DAILY  ? Multiple Vitamins-Minerals (EMERGEN-C VITAMIN D/CALCIUM PO) Take by mouth.  ? SUMAtriptan (IMITREX) 100 MG tablet   ? [DISCONTINUED] bisoprolol-hydrochlorothiazide (ZIAC) 2.5-6.25 MG tablet Take 1 tablet by mouth daily.  ? bisoprolol-hydrochlorothiazide (ZIAC) 2.5-6.25 MG tablet Take 1 tablet by mouth daily.  ? ?No facility-administered encounter medications on file as of 08/11/2021.  ? ? ?Surgical History: ?Past Surgical History:  ?Procedure Laterality Date  ? BREAST CYST ASPIRATION Right 2014  ? neg  ? BREAST EXCISIONAL BIOPSY Right 1983  ? neg  ? COLONOSCOPY WITH PROPOFOL N/A 08/05/2021  ? Procedure: COLONOSCOPY WITH PROPOFOL;  Surgeon: Toney Reil, MD;  [facility] ENDOSCOPY;  Service: Gastroenterology;  Laterality: N/A;  ? TUBAL LIGATION    ? ? ?Medical History: ?Past Medical History:  ?Diagnosis Date  ? Diabetes mellitus without complication (HCC)   ? Hypercholesteremia   ? Hypertension   ? ? ?Family History: ?Family History  ?Problem Relation Age of Onset  ? Hypertension Mother   ? Hyperlipidemia Mother   ? Diabetes Mother   ? Diabetes Maternal Grandmother   ? ? ?Social History  ? ?Socioeconomic History  ? Marital status: Married  ?  Spouse name: Not on file  ? Number of children: Not on file  ? Years of education: Not on file  ? Highest education level: Not on file  ?Occupational History  ? Not on file  ?Tobacco Use  ? Smoking status: Never  ? Smokeless tobacco: Never  ?Vaping Use  ? Vaping Use: Never used  ?Substance and Sexual Activity  ? Alcohol use: Yes  ?  Comment: occasional  ? Drug use: Never  ? Sexual activity: Not on file  ?Other Topics Concern  ? Not on file  ?Social History Narrative  ? Not on file  ? ?Social Determinants of Health  ? ?Financial Resource Strain: Not on file  ?Food Insecurity: Not on file  ?Transportation Needs: Not on file  ?Physical Activity: Not on file  ?Stress: Not on file  ?Social Connections: Not on file  ?Intimate Partner Violence: Not on file  ? ? ? ? ?Review of Systems  ?Constitutional:  Negative for chills and unexpected  weight change.  ?HENT:  Negative for congestion, postnasal drip, rhinorrhea, sneezing and sore throat.   ?Eyes:  Negative for redness.  ?Respiratory:  Negative for cough, chest tightness and shortness of breath.   ?Cardiovascular:  Negative for chest pain and palpitations.  ?Gastrointestinal:  Negative for abdominal pain, constipation, diarrhea, nausea and vomiting.  ?Genitourinary:  Negative for dysuria and frequency.  ?Musculoskeletal:  Positive for arthralgias. Negative for back pain, joint swelling and neck pain.  ?Skin:  Negative for rash.  ?Neurological: Negative.  Negative for tremors  and numbness.  ?Hematological:  Negative for adenopathy. Does not bruise/bleed easily.  ?Psychiatric/Behavioral:  Negative for behavioral problems (Depression), sleep disturbance and suicidal ideas. The patient is not nervous/anxious.   ? ?Vital Signs: ?BP 108/67   Pulse 71   Temp 98.3 ?F (36.8 ?C)   Resp 16   Ht 5\' 6"  (1.676 m)   Wt 239 lb 12.8 oz (108.8 kg)   SpO2 95%   BMI 38.70 kg/m?  ? ? ?Physical Exam ?Vitals and nursing note reviewed.  ?Constitutional:   ?   General: She is not in acute distress. ?   Appearance: She is well-developed. She is obese. She is not diaphoretic.  ?HENT:  ?   Head: Normocephalic and atraumatic.  ?   Mouth/Throat:  ?   Pharynx: No oropharyngeal exudate.  ?Eyes:  ?   Pupils: Pupils are equal, round, and reactive to light.  ?Neck:  ?   Thyroid: No thyromegaly.  ?   Vascular: No JVD.  ?   Trachea: No tracheal deviation.  ?Cardiovascular:  ?   Rate and Rhythm: Normal rate and regular rhythm.  ?   Heart sounds: Normal heart sounds. No murmur heard. ?  No friction rub. No gallop.  ?Pulmonary:  ?   Effort: Pulmonary effort is normal. No respiratory distress.  ?   Breath sounds: No wheezing or rales.  ?Chest:  ?   Chest wall: No tenderness.  ?Abdominal:  ?   General: Bowel sounds are normal.  ?   Palpations: Abdomen is soft.  ?Musculoskeletal:     ?   General: Normal range of motion.  ?   Cervical back: Normal range of motion and neck supple.  ?Lymphadenopathy:  ?   Cervical: No cervical adenopathy.  ?Skin: ?   General: Skin is warm and dry.  ?Neurological:  ?   Mental Status: She is alert and oriented to person, place, and time.  ?   Cranial Nerves: No cranial nerve deficit.  ?   Sensory: No sensory deficit.  ?Psychiatric:     ?   Behavior: Behavior normal.     ?   Thought Content: Thought content normal.     ?   Judgment: Judgment normal.  ? ? ? ? ? ?Assessment/Plan: ?1. Type 2 diabetes mellitus without complication, without long-term current use of insulin (HCC) ?- POCT HgB A1C is  6.2 which is stable from the last few checks.  Patient will continue Trulicity while improving diet and exercise ? ?2. Essential hypertension ?Stable, continue current medication ?- bisoprolol-hydrochlorothiazide (ZIAC) 2.5-6.25 MG tablet; Take 1 tablet by mouth daily.  Dispense: 90 tablet; Refill: 1 ? ?3. Mixed hyperlipidemia ?Continue Lipitor ? ?4. Enlarged lymph nodes in armpit ?Diagnostic mammogram scheduled for March 14, unclear why ultrasound of breast/axilla ordered at last visit was not also scheduled therefore we will go ahead and place new order ?- 04-29-1972 BREAST LTD UNI RIGHT INC AXILLA; Future ? ? ?General Counseling:  Princes verbalizes understanding of the findings of todays visit and agrees with plan of treatment. I have discussed any further diagnostic evaluation that may be needed or ordered today. We also reviewed her medications today. she has been encouraged to call the office with any questions or concerns that should arise related to todays visit. ? ? ? ?Orders Placed This Encounter  ?Procedures  ? US BREAST LTD UNI RIGHT INC AXILLA  ? POCT HgB A1C  ? ? ?Meds ordered this encounter  ?Medications  ? bisoprolol-hydrochlorothiazide (ZIAC) 2.5-6.25 MG tablet  ?  Sig: Take 1 tablet by mouth daily.  ?  Dispense:  90 tablet  ?  Refill:  1  ?  Requesting 1 year supply  ? ? ?This patient was seen by Lynn Ito, PA-C in collaboration with Dr. Beverely Risen as a part of collaborative care agreement. ? ? ?Total time spent:30 Minutes ?Time spent includes review of chart, medications, test results, and follow up plan with the patient.  ? ? ? ? ?Dr Lyndon Code ?Internal medicine  ?

## 2021-08-23 ENCOUNTER — Other Ambulatory Visit: Payer: Self-pay

## 2021-08-23 ENCOUNTER — Ambulatory Visit
Admission: RE | Admit: 2021-08-23 | Discharge: 2021-08-23 | Disposition: A | Discharge status: Home / Self Care | Payer: BC Managed Care – PPO | Source: Ambulatory Visit | Attending: Physician Assistant | Admitting: Physician Assistant

## 2021-08-23 DIAGNOSIS — R928 Other abnormal and inconclusive findings on diagnostic imaging of breast: Secondary | ICD-10-CM | POA: Diagnosis not present

## 2021-08-23 DIAGNOSIS — R922 Inconclusive mammogram: Secondary | ICD-10-CM | POA: Diagnosis not present

## 2021-08-23 DIAGNOSIS — R921 Mammographic calcification found on diagnostic imaging of breast: Secondary | ICD-10-CM | POA: Diagnosis not present

## 2021-09-06 ENCOUNTER — Telehealth: Payer: Self-pay

## 2021-09-06 NOTE — Telephone Encounter (Signed)
Breast ultrasound ordered cancelled. [place] told patient after reviewing mammogram, ultrasound was not needed-Toni ?

## 2021-09-07 ENCOUNTER — Other Ambulatory Visit: Payer: Self-pay | Admitting: Physician Assistant

## 2021-09-07 DIAGNOSIS — E782 Mixed hyperlipidemia: Secondary | ICD-10-CM

## 2021-11-10 ENCOUNTER — Ambulatory Visit: Payer: BC Managed Care – PPO | Admitting: Physician Assistant

## 2021-11-17 ENCOUNTER — Ambulatory Visit: Payer: BC Managed Care – PPO | Admitting: Physician Assistant

## 2021-11-17 ENCOUNTER — Encounter: Payer: Self-pay | Admitting: Physician Assistant

## 2021-11-17 VITALS — BP 127/77 | HR 67 | Temp 98.3°F | Resp 16 | Ht 66.0 in | Wt 236.4 lb

## 2021-11-17 DIAGNOSIS — E119 Type 2 diabetes mellitus without complications: Secondary | ICD-10-CM | POA: Diagnosis not present

## 2021-11-17 DIAGNOSIS — I10 Essential (primary) hypertension: Secondary | ICD-10-CM

## 2021-11-17 LAB — POCT GLYCOSYLATED HEMOGLOBIN (HGB A1C): Hemoglobin A1C: 6 % — AB (ref 4.0–5.6)

## 2021-11-17 NOTE — Progress Notes (Cosign Needed)
[facility] [address] [place], [place] [address]  Internal MEDICINE  Office Visit Note  Patient Name: Lindsey Cruz  [id number]  [id number]  Date of Service: 11/29/2021  Chief Complaint  Patient presents with   Follow-up    Left leg has a spot, raised, sometimes tender   Diabetes   Hyperlipidemia   Hypertension    HPI Pt is here for routine follow up -Working on keeping sugars down -Sugars have been good at home, doing well with trulicity  -Daughter is graduating, will be going to [employer] in the fall -BP stable -occasional left leg vein sticks out and sometimes tender. Possible varicose veins, but patient unable to show me in office today due to being unable to lift pant leg high enough and states it isnt at that point where she wants to do anything for it anyway and will point it out at future visit instead.  Current Medication: Outpatient Encounter Medications as of 11/17/2021  Medication Sig   Accu-Chek FastClix Lancets MISC Use as directed dx e11.65   atorvastatin (LIPITOR) 40 MG tablet TAKE 1 TABLET BY MOUTH DAILY   bisoprolol-hydrochlorothiazide (ZIAC) 2.5-6.25 MG tablet Take 1 tablet by mouth daily.   Dulaglutide (TRULICITY) 1.5 MG/0.5ML SOPN INJECT SUBCUTANEOUSLY 1.5MG  ONCE WEEKLY   glucose blood (ACCU-CHEK GUIDE) test strip TEST BLOOD SUGAR EVERY DAY AS DIRECTED   hydrochlorothiazide (MICROZIDE) 12.5 MG capsule TAKE 1 CAPSULE BY MOUTH  DAILY   Multiple Vitamins-Minerals (EMERGEN-C VITAMIN D/CALCIUM PO) Take by mouth.   SUMAtriptan (IMITREX) 100 MG tablet    No facility-administered encounter medications on file as of 11/17/2021.    Surgical History: Past Surgical History:  Procedure Laterality Date   BREAST CYST ASPIRATION Right 2014   neg   BREAST EXCISIONAL BIOPSY Right 1983   neg   COLONOSCOPY WITH PROPOFOL N/A 08/05/2021   Procedure: COLONOSCOPY WITH PROPOFOL;  Surgeon: Toney Reil, MD;  Location: [facility] ENDOSCOPY;  Service:  Gastroenterology;  Laterality: N/A;   TUBAL LIGATION      Medical History: Past Medical History:  Diagnosis Date   Diabetes mellitus without complication (HCC)    Hypercholesteremia    Hypertension     Family History: Family History  Problem Relation Age of Onset   Hypertension Mother    Hyperlipidemia Mother    Diabetes Mother    Diabetes Maternal Grandmother     Social History   Socioeconomic History   Marital status: Married    Spouse name: Not on file   Number of children: Not on file   Years of education: Not on file   Highest education level: Not on file  Occupational History   Not on file  Tobacco Use   Smoking status: Never   Smokeless tobacco: Never  Vaping Use   Vaping Use: Never used  Substance and Sexual Activity   Alcohol use: Yes    Comment: occasional   Drug use: Never   Sexual activity: Not on file  Other Topics Concern   Not on file  Social History Narrative   Not on file   Social Determinants of Health   Financial Resource Strain: Not on file  Food Insecurity: Not on file  Transportation Needs: Not on file  Physical Activity: Not on file  Stress: Not on file  Social Connections: Not on file  Intimate Partner Violence: Not on file      Review of Systems  Constitutional:  Negative for chills and unexpected weight change.  HENT:  Negative for congestion, postnasal drip, rhinorrhea, sneezing and sore throat.   Eyes:  Negative for redness.  Respiratory:  Negative for cough, chest tightness and shortness of breath.   Cardiovascular:  Negative for chest pain and palpitations.  Gastrointestinal:  Negative for abdominal pain, constipation, diarrhea, nausea and vomiting.  Genitourinary:  Negative for dysuria and frequency.  Musculoskeletal:  Positive for arthralgias. Negative for back pain, joint swelling and neck pain.  Skin:  Negative for rash.  Neurological: Negative.  Negative for tremors and numbness.  Hematological:  Negative for  adenopathy. Does not bruise/bleed easily.  Psychiatric/Behavioral:  Negative for behavioral problems (Depression), sleep disturbance and suicidal ideas. The patient is not nervous/anxious.     Vital Signs: BP 127/77   Pulse 67   Temp 98.3 F (36.8 C)   Resp 16   Ht 5\' 6"  (1.676 m)   Wt 236 lb 6.4 oz (107.2 kg)   SpO2 99%   BMI 38.16 kg/m    Physical Exam Vitals and nursing note reviewed.  Constitutional:      General: She is not in acute distress.    Appearance: She is well-developed. She is obese. She is not diaphoretic.  HENT:     Head: Normocephalic and atraumatic.     Mouth/Throat:     Pharynx: No oropharyngeal exudate.  Eyes:     Pupils: Pupils are equal, round, and reactive to light.  Neck:     Thyroid: No thyromegaly.     Vascular: No JVD.     Trachea: No tracheal deviation.  Cardiovascular:     Rate and Rhythm: Normal rate and regular rhythm.     Heart sounds: Normal heart sounds. No murmur heard.    No friction rub. No gallop.  Pulmonary:     Effort: Pulmonary effort is normal. No respiratory distress.     Breath sounds: No wheezing or rales.  Chest:     Chest wall: No tenderness.  Abdominal:     General: Bowel sounds are normal.     Palpations: Abdomen is soft.  Musculoskeletal:        General: Normal range of motion.     Cervical back: Normal range of motion and neck supple.  Lymphadenopathy:     Cervical: No cervical adenopathy.  Skin:    General: Skin is warm and dry.  Neurological:     Mental Status: She is alert and oriented to person, place, and time.     Cranial Nerves: No cranial nerve deficit.     Sensory: No sensory deficit.  Psychiatric:        Behavior: Behavior normal.        Thought Content: Thought content normal.        Judgment: Judgment normal.        Assessment/Plan: 1. Type 2 diabetes mellitus without complication, without long-term current use of insulin (HCC) - POCT HgB A1C is 6.0 which is improved form 6.2 last visit.  Continue current medications and improving diet and exercise  2. Essential hypertension Stable, continue current medications   General Counseling: Galaxy verbalizes understanding of the findings of todays visit and agrees with plan of treatment. I have discussed any further diagnostic evaluation that may be needed or ordered today. We also reviewed her medications today. she has been encouraged to call the office with any questions or concerns that should arise related to todays visit.    Orders Placed This Encounter  Procedures   POCT HgB A1C    No  orders of the defined types were placed in this encounter.   This patient was seen by Drema Dallas, PA-C in collaboration with Dr. Clayborn Bigness as a part of collaborative care agreement.   Total time spent:30 Minutes Time spent includes review of chart, medications, test results, and follow up plan with the patient.      Dr Lavera Guise Internal medicine

## 2022-01-30 ENCOUNTER — Other Ambulatory Visit: Payer: Self-pay | Admitting: Physician Assistant

## 2022-01-30 DIAGNOSIS — E782 Mixed hyperlipidemia: Secondary | ICD-10-CM

## 2022-02-16 ENCOUNTER — Ambulatory Visit: Payer: BC Managed Care – PPO | Admitting: Physician Assistant

## 2022-02-23 ENCOUNTER — Encounter: Payer: Self-pay | Admitting: Physician Assistant

## 2022-02-23 ENCOUNTER — Ambulatory Visit (INDEPENDENT_AMBULATORY_CARE_PROVIDER_SITE_OTHER): Payer: BC Managed Care – PPO | Admitting: Physician Assistant

## 2022-02-23 VITALS — BP 126/80 | HR 67 | Temp 98.1°F | Resp 16 | Ht 66.0 in | Wt 238.6 lb

## 2022-02-23 DIAGNOSIS — E782 Mixed hyperlipidemia: Secondary | ICD-10-CM

## 2022-02-23 DIAGNOSIS — I10 Essential (primary) hypertension: Secondary | ICD-10-CM

## 2022-02-23 DIAGNOSIS — E119 Type 2 diabetes mellitus without complications: Secondary | ICD-10-CM

## 2022-02-23 LAB — POCT GLYCOSYLATED HEMOGLOBIN (HGB A1C): Hemoglobin A1C: 5.9 % — AB (ref 4.0–5.6)

## 2022-02-23 NOTE — Progress Notes (Unsigned)
[facility] [address] [place], [place] [address]  Internal MEDICINE  Office Visit Note  Patient Name: Lindsey Cruz  [id number]  [id number]  Date of Service: 03/01/2022  Chief Complaint  Patient presents with   Follow-up   Diabetes   Hypertension   Hyperlipidemia   Quality Metric Gaps    Tetanus and Eye Exam    HPI Pt is here for routine follow up and has no complaints today -Reports diet has been a little worse, so was pleasantly surprised that A1c looked better and is motivated to continue to lower it -daughter has started at college now, did have problems getting her classes scheduled and is adjusting. -Eye exam not due until next spring -BP normal on recheck  Current Medication: Outpatient Encounter Medications as of 02/23/2022  Medication Sig   Accu-Chek FastClix Lancets MISC Use as directed dx e11.65   atorvastatin (LIPITOR) 40 MG tablet TAKE 1 TABLET(40 MG) BY MOUTH DAILY   bisoprolol-hydrochlorothiazide (ZIAC) 2.5-6.25 MG tablet Take 1 tablet by mouth daily.   Dulaglutide (TRULICITY) 1.5 MG/0.5ML SOPN INJECT SUBCUTANEOUSLY 1.5MG  ONCE WEEKLY   glucose blood (ACCU-CHEK GUIDE) test strip TEST BLOOD SUGAR EVERY DAY AS DIRECTED   hydrochlorothiazide (MICROZIDE) 12.5 MG capsule TAKE 1 CAPSULE BY MOUTH  DAILY   Multiple Vitamins-Minerals (EMERGEN-C VITAMIN D/CALCIUM PO) Take by mouth.   SUMAtriptan (IMITREX) 100 MG tablet    No facility-administered encounter medications on file as of 02/23/2022.    Surgical History: Past Surgical History:  Procedure Laterality Date   BREAST CYST ASPIRATION Right 2014   neg   BREAST EXCISIONAL BIOPSY Right 1983   neg   COLONOSCOPY WITH PROPOFOL N/A 08/05/2021   Procedure: COLONOSCOPY WITH PROPOFOL;  Surgeon: Toney Reil, MD;  Location: [facility] ENDOSCOPY;  Service: Gastroenterology;  Laterality: N/A;   TUBAL LIGATION      Medical History: Past Medical History:  Diagnosis Date   Diabetes mellitus without  complication (HCC)    Hypercholesteremia    Hypertension     Family History: Family History  Problem Relation Age of Onset   Hypertension Mother    Hyperlipidemia Mother    Diabetes Mother    Diabetes Maternal Grandmother     Social History   Socioeconomic History   Marital status: Married    Spouse name: Not on file   Number of children: Not on file   Years of education: Not on file   Highest education level: Not on file  Occupational History   Not on file  Tobacco Use   Smoking status: Never   Smokeless tobacco: Never  Vaping Use   Vaping Use: Never used  Substance and Sexual Activity   Alcohol use: Yes    Comment: occasional   Drug use: Never   Sexual activity: Not on file  Other Topics Concern   Not on file  Social History Narrative   Not on file   Social Determinants of Health   Financial Resource Strain: Not on file  Food Insecurity: Not on file  Transportation Needs: Not on file  Physical Activity: Not on file  Stress: Not on file  Social Connections: Not on file  Intimate Partner Violence: Not on file      Review of Systems  Constitutional:  Negative for chills and unexpected weight change.  HENT:  Negative for congestion, postnasal drip, rhinorrhea, sneezing and sore throat.   Eyes:  Negative for redness.  Respiratory:  Negative for cough, chest tightness and shortness of breath.  Cardiovascular:  Negative for chest pain and palpitations.  Gastrointestinal:  Negative for abdominal pain, constipation, diarrhea, nausea and vomiting.  Genitourinary:  Negative for dysuria and frequency.  Musculoskeletal:  Negative for back pain, joint swelling and neck pain.  Skin:  Negative for rash.  Neurological: Negative.  Negative for tremors and numbness.  Hematological:  Negative for adenopathy. Does not bruise/bleed easily.  Psychiatric/Behavioral:  Negative for behavioral problems (Depression), sleep disturbance and suicidal ideas. The patient is not  nervous/anxious.     Vital Signs: BP 126/80 Comment: 143/80  Pulse 67   Temp 98.1 F (36.7 C)   Resp 16   Ht 5\' 6"  (1.676 m)   Wt 238 lb 9.6 oz (108.2 kg)   SpO2 98%   BMI 38.51 kg/m    Physical Exam Vitals and nursing note reviewed.  Constitutional:      General: She is not in acute distress.    Appearance: She is well-developed. She is obese. She is not diaphoretic.  HENT:     Head: Normocephalic and atraumatic.     Mouth/Throat:     Pharynx: No oropharyngeal exudate.  Eyes:     Pupils: Pupils are equal, round, and reactive to light.  Neck:     Thyroid: No thyromegaly.     Vascular: No JVD.     Trachea: No tracheal deviation.  Cardiovascular:     Rate and Rhythm: Normal rate and regular rhythm.     Heart sounds: Normal heart sounds. No murmur heard.    No friction rub. No gallop.  Pulmonary:     Effort: Pulmonary effort is normal. No respiratory distress.     Breath sounds: No wheezing or rales.  Chest:     Chest wall: No tenderness.  Abdominal:     General: Bowel sounds are normal.     Palpations: Abdomen is soft.  Musculoskeletal:        General: Normal range of motion.     Cervical back: Normal range of motion and neck supple.  Lymphadenopathy:     Cervical: No cervical adenopathy.  Skin:    General: Skin is warm and dry.  Neurological:     Mental Status: She is alert and oriented to person, place, and time.     Cranial Nerves: No cranial nerve deficit.     Sensory: No sensory deficit.  Psychiatric:        Behavior: Behavior normal.        Thought Content: Thought content normal.        Judgment: Judgment normal.        Assessment/Plan: 1. Type 2 diabetes mellitus without complication, without long-term current use of insulin (HCC) - POCT HgB A1C is 5.9 which is improved from 6.0 last check, Continue current medications and improving diet and exercise  2. Essential hypertension Controlled, continue current medications  3. Mixed  hyperlipidemia Continue lipitor   General Counseling: Sadonna verbalizes understanding of the findings of todays visit and agrees with plan of treatment. I have discussed any further diagnostic evaluation that may be needed or ordered today. We also reviewed her medications today. she has been encouraged to call the office with any questions or concerns that should arise related to todays visit.    Orders Placed This Encounter  Procedures   POCT HgB A1C    No orders of the defined types were placed in this encounter.   This patient was seen by Synetta Fail, PA-C in collaboration with Dr. Lynn Ito as  a part of collaborative care agreement.   Total time spent:30 Minutes Time spent includes review of chart, medications, test results, and follow up plan with the patient.      Dr Lyndon Code Internal medicine

## 2022-03-27 ENCOUNTER — Other Ambulatory Visit: Payer: Self-pay | Admitting: Internal Medicine

## 2022-03-27 DIAGNOSIS — E1165 Type 2 diabetes mellitus with hyperglycemia: Secondary | ICD-10-CM

## 2022-04-13 ENCOUNTER — Other Ambulatory Visit: Payer: Self-pay

## 2022-04-13 DIAGNOSIS — I10 Essential (primary) hypertension: Secondary | ICD-10-CM

## 2022-04-13 MED ORDER — BISOPROLOL-HYDROCHLOROTHIAZIDE 2.5-6.25 MG PO TABS: 1.0000 | ORAL_TABLET | Freq: Every day | ORAL | 1 refills | Status: DC

## 2022-05-25 ENCOUNTER — Ambulatory Visit: Payer: BC Managed Care – PPO | Admitting: Physician Assistant

## 2022-06-02 ENCOUNTER — Other Ambulatory Visit: Payer: Self-pay | Admitting: Internal Medicine

## 2022-06-09 ENCOUNTER — Other Ambulatory Visit: Payer: Self-pay

## 2022-06-09 MED ORDER — HYDROCHLOROTHIAZIDE 12.5 MG PO CAPS: 12.5000 mg | ORAL_CAPSULE | Freq: Every day | ORAL | 1 refills | Status: DC

## 2022-06-15 ENCOUNTER — Encounter: Payer: BC Managed Care – PPO | Admitting: Physician Assistant

## 2022-07-03 ENCOUNTER — Ambulatory Visit: Payer: BC Managed Care – PPO | Admitting: Physician Assistant

## 2022-07-06 ENCOUNTER — Encounter: Payer: Self-pay | Admitting: Physician Assistant

## 2022-07-06 ENCOUNTER — Ambulatory Visit (INDEPENDENT_AMBULATORY_CARE_PROVIDER_SITE_OTHER): Payer: BC Managed Care – PPO | Admitting: Physician Assistant

## 2022-07-06 VITALS — BP 126/72 | HR 71 | Temp 98.4°F | Resp 16 | Ht 66.0 in | Wt 239.4 lb

## 2022-07-06 DIAGNOSIS — I10 Essential (primary) hypertension: Secondary | ICD-10-CM | POA: Diagnosis not present

## 2022-07-06 DIAGNOSIS — Z0001 Encounter for general adult medical examination with abnormal findings: Secondary | ICD-10-CM

## 2022-07-06 DIAGNOSIS — E782 Mixed hyperlipidemia: Secondary | ICD-10-CM

## 2022-07-06 DIAGNOSIS — E119 Type 2 diabetes mellitus without complications: Secondary | ICD-10-CM

## 2022-07-06 DIAGNOSIS — Z23 Encounter for immunization: Secondary | ICD-10-CM

## 2022-07-06 DIAGNOSIS — Z1231 Encounter for screening mammogram for malignant neoplasm of breast: Secondary | ICD-10-CM

## 2022-07-06 DIAGNOSIS — R300 Dysuria: Secondary | ICD-10-CM

## 2022-07-06 DIAGNOSIS — R5383 Other fatigue: Secondary | ICD-10-CM

## 2022-07-06 LAB — POCT GLYCOSYLATED HEMOGLOBIN (HGB A1C): Hemoglobin A1C: 6.3 % — AB (ref 4.0–5.6)

## 2022-07-06 MED ORDER — SHINGRIX 50 MCG/0.5ML IM SUSR: 0.5000 mL | Freq: Once | INTRAMUSCULAR | 0 refills | Status: AC

## 2022-07-06 MED ORDER — BISOPROLOL-HYDROCHLOROTHIAZIDE 2.5-6.25 MG PO TABS: 1.0000 | ORAL_TABLET | Freq: Every day | ORAL | 1 refills | Status: DC

## 2022-07-06 NOTE — Progress Notes (Signed)
[facility] [place], [place] [address]  Internal MEDICINE  Office Visit Note  Patient Name: Lindsey Cruz  [id number]  [id number]  Date of Service: 07/07/2022  Chief Complaint  Patient presents with   Annual Exam   Diabetes   Hypertension   Hyperlipidemia   Quality Metric Gaps    TDAP and Shingles     HPI Pt is here for routine health maintenance examination -BG have been ok. Did have a low Bg reading after eating one day. This has only happened once -Had missed a week on her shot, but this low was several days after taking next dose after the gap -BP stable -Will schedule annual eye appt due to March -Mammogram due in march as well and will schedule -due for pap next visit -foot exam done -will have shingles vaccines, thinks she had tdap in last 10 years  Current Medication: Outpatient Encounter Medications as of 07/06/2022  Medication Sig   Accu-Chek FastClix Lancets MISC Use as directed dx e11.65   ACCU-CHEK GUIDE test strip TEST BLOOD SUGAR EVERY DAY AS DIRECTED   atorvastatin (LIPITOR) 40 MG tablet TAKE 1 TABLET(40 MG) BY MOUTH DAILY   Dulaglutide (TRULICITY) 1.5 TF/5.7DU SOPN ADMINISTER 1.5 MG UNDER THE SKIN 1 TIME WEEKLY   hydrochlorothiazide (MICROZIDE) 12.5 MG capsule Take 1 capsule (12.5 mg total) by mouth daily.   Multiple Vitamins-Minerals (EMERGEN-C VITAMIN D/CALCIUM PO) Take by mouth.   SUMAtriptan (IMITREX) 100 MG tablet    [DISCONTINUED] bisoprolol-hydrochlorothiazide (ZIAC) 2.5-6.25 MG tablet Take 1 tablet by mouth daily.   [DISCONTINUED] Zoster Vaccine Adjuvanted [facility]) injection Inject 0.5 mLs into the muscle once.   bisoprolol-hydrochlorothiazide (ZIAC) 2.5-6.25 MG tablet Take 1 tablet by mouth daily.   [EXPIRED] Zoster Vaccine Adjuvanted [facility]) injection Inject 0.5 mLs into the muscle once for 1 dose.   No facility-administered encounter medications on file as of 07/06/2022.    Surgical History: Past Surgical  History:  Procedure Laterality Date   BREAST CYST ASPIRATION Right 2014   neg   BREAST EXCISIONAL BIOPSY Right 1983   neg   COLONOSCOPY WITH PROPOFOL N/A 08/05/2021   Procedure: COLONOSCOPY WITH PROPOFOL;  Surgeon: Lin Landsman, MD;  Location: [facility] ENDOSCOPY;  Service: Gastroenterology;  Laterality: N/A;   TUBAL LIGATION      Medical History: Past Medical History:  Diagnosis Date   Diabetes mellitus without complication ([place])    Hypercholesteremia    Hypertension     Family History: Family History  Problem Relation Age of Onset   Hypertension Mother    Hyperlipidemia Mother    Diabetes Mother    Diabetes Maternal Grandmother       Review of Systems  Constitutional:  Negative for chills and unexpected weight change.  HENT:  Negative for congestion, postnasal drip, rhinorrhea, sneezing and sore throat.   Eyes:  Negative for redness.  Respiratory:  Negative for cough, chest tightness and shortness of breath.   Cardiovascular:  Negative for chest pain and palpitations.  Gastrointestinal:  Negative for abdominal pain, constipation, diarrhea, nausea and vomiting.  Genitourinary:  Negative for dysuria and frequency.  Musculoskeletal:  Negative for back pain, joint swelling and neck pain.  Skin:  Negative for rash.  Neurological: Negative.  Negative for tremors and numbness.  Hematological:  Negative for adenopathy. Does not bruise/bleed easily.  Psychiatric/Behavioral:  Negative for behavioral problems (Depression), sleep disturbance and suicidal ideas. The patient is not nervous/anxious.      Vital Signs: BP 126/72  Pulse 71   Temp 98.4 F (36.9 C)   Resp 16   Ht 5\' 6"  (1.676 m)   Wt 239 lb 6.4 oz (108.6 kg)   SpO2 97%   BMI 38.64 kg/m    Physical Exam Vitals and nursing note reviewed.  Constitutional:      General: She is not in acute distress.    Appearance: She is well-developed. She is obese. She is not diaphoretic.  HENT:     Head: Normocephalic  and atraumatic.     Mouth/Throat:     Pharynx: No oropharyngeal exudate.  Eyes:     Pupils: Pupils are equal, round, and reactive to light.  Neck:     Thyroid: No thyromegaly.     Vascular: No JVD.     Trachea: No tracheal deviation.  Cardiovascular:     Rate and Rhythm: Normal rate and regular rhythm.     Pulses:          Dorsalis pedis pulses are 3+ on the right side and 3+ on the left side.       Posterior tibial pulses are 3+ on the right side and 3+ on the left side.     Heart sounds: Normal heart sounds. No murmur heard.    No friction rub. No gallop.  Pulmonary:     Effort: Pulmonary effort is normal. No respiratory distress.     Breath sounds: No wheezing or rales.  Chest:     Chest wall: No tenderness.  Breasts:    Right: Normal. No mass.     Left: Normal. No mass.  Abdominal:     General: Bowel sounds are normal.     Palpations: Abdomen is soft.     Tenderness: There is no abdominal tenderness.  Musculoskeletal:        General: Normal range of motion.     Cervical back: Normal range of motion and neck supple.     Right lower leg: No edema.     Left lower leg: No edema.     Right foot: Normal range of motion.     Left foot: Normal range of motion.  Feet:     Right foot:     Protective Sensation: 2 sites tested.  2 sites sensed.     Skin integrity: Skin integrity normal.     Toenail Condition: Right toenails are normal.     Left foot:     Protective Sensation: 2 sites tested.  2 sites sensed.     Skin integrity: Skin integrity normal.     Toenail Condition: Left toenails are normal.  Lymphadenopathy:     Cervical: No cervical adenopathy.  Skin:    General: Skin is warm and dry.  Neurological:     Mental Status: She is alert and oriented to person, place, and time.     Cranial Nerves: No cranial nerve deficit.     Sensory: No sensory deficit.  Psychiatric:        Behavior: Behavior normal.        Thought Content: Thought content normal.        Judgment:  Judgment normal.      LABS: Recent Results (from the past 2160 hour(s))  POCT HgB A1C     Status: Abnormal   Collection Time: 07/06/22  2:16 PM  Result Value Ref Range   Hemoglobin A1C 6.3 (A) 4.0 - 5.6 %   HbA1c POC (<> result, manual entry)     HbA1c, POC (prediabetic range)  HbA1c, POC (controlled diabetic range)    Urine Microalbumin w/creat. ratio     Status: None   Collection Time: 07/06/22  3:57 PM  Result Value Ref Range   Creatinine, Urine 74.8 Not Estab. mg/dL   Microalbumin, Urine 7.1 Not Estab. ug/mL   Microalb/Creat Ratio 9 0 - 29 mg/g creat    Comment:                        Normal:                0 -  29                        Moderately increased: 30 - 300                        Severely increased:       >300   UA/M w/rflx Culture, Routine     Status: Abnormal (Preliminary result)   Collection Time: 07/06/22  3:57 PM   Specimen: Urine   Urine  Result Value Ref Range   Specific Gravity, UA 1.017 1.005 - 1.030   pH, UA 5.0 5.0 - 7.5   Color, UA Yellow Yellow   Appearance Ur Clear Clear   Leukocytes,UA Negative Negative   Protein,UA Negative Negative/Trace   Glucose, UA Negative Negative   Ketones, UA Negative Negative   RBC, UA Trace (A) Negative   Bilirubin, UA Negative Negative   Urobilinogen, Ur 0.2 0.2 - 1.0 mg/dL   Nitrite, UA Negative Negative   Microscopic Examination See below:     Comment: Microscopic was indicated and was performed.   Urinalysis Reflex Comment     Comment: This specimen has reflexed to a Urine Culture.  Microscopic Examination     Status: Abnormal   Collection Time: 07/06/22  3:57 PM   Urine  Result Value Ref Range   WBC, UA 0-5 0 - 5 /hpf   RBC, Urine None seen 0 - 2 /hpf   Epithelial Cells (non renal) 0-10 0 - 10 /hpf   Casts None seen None seen /lpf   Bacteria, UA Moderate (A) None seen/Few  Urine Culture, Reflex     Status: None (Preliminary result)   Collection Time: 07/06/22  3:57 PM   Urine  Result Value Ref  Range   Urine Culture, Routine WILL FOLLOW         Assessment/Plan: 1. Encounter for general adult medical examination with abnormal findings CPE performed, routine fasting labs ordered, will be due for mammogram in march and pap in april - CBC w/Diff/Platelet - Comprehensive metabolic panel - TSH + free T4 - Lipid Panel With LDL/HDL Ratio  2. Type 2 diabetes mellitus without complication, without long-term current use of insulin (HCC) - POCT HgB A1C is 6.3 which is increased from 5.9 last check and will work on improving diet and exercise and not missing any trulicity doses - Urine Microalbumin w/creat. ratio  3. Essential hypertension Stable, continue current medication - bisoprolol-hydrochlorothiazide (ZIAC) 2.5-6.25 MG tablet; Take 1 tablet by mouth daily.  Dispense: 90 tablet; Refill: 1  4. Visit for screening mammogram - MM 3D SCREEN BREAST BILATERAL; Future  5. Need for shingles vaccine - Zoster Vaccine Adjuvanted [facility]) injection; Inject 0.5 mLs into the muscle once for 1 dose.  Dispense: 0.5 mL; Refill: 0  6. Mixed hyperlipidemia Continue lipitor and will update labs - Lipid Panel  With LDL/HDL Ratio  7. Dysuria - UA/M w/rflx Culture, Routine - Microscopic Examination - Urine Culture, Reflex  8. Other fatigue - CBC w/Diff/Platelet - Comprehensive metabolic panel - TSH + free T4 - Lipid Panel With LDL/HDL Ratio   General Counseling: Aditri verbalizes understanding of the findings of todays visit and agrees with plan of treatment. I have discussed any further diagnostic evaluation that may be needed or ordered today. We also reviewed her medications today. she has been encouraged to call the office with any questions or concerns that should arise related to todays visit.    Counseling:    Orders Placed This Encounter  Procedures   Microscopic Examination   Urine Culture, Reflex   MM 3D SCREEN BREAST BILATERAL   Urine Microalbumin w/creat. ratio    UA/M w/rflx Culture, Routine   CBC w/Diff/Platelet   Comprehensive metabolic panel   TSH + free T4   Lipid Panel With LDL/HDL Ratio   POCT HgB A1C    Meds ordered this encounter  Medications   Zoster Vaccine Adjuvanted [facility]) injection    Sig: Inject 0.5 mLs into the muscle once for 1 dose.    Dispense:  0.5 mL    Refill:  0   bisoprolol-hydrochlorothiazide (ZIAC) 2.5-6.25 MG tablet    Sig: Take 1 tablet by mouth daily.    Dispense:  90 tablet    Refill:  1    Requesting 1 year supply    This patient was seen by Lynn Ito, PA-C in collaboration with Dr. Beverely Risen as a part of collaborative care agreement.  Total time spent:35 Minutes  Time spent includes review of chart, medications, test results, and follow up plan with the patient.     Lyndon Code, MD  Internal Medicine

## 2022-07-11 DIAGNOSIS — Z0001 Encounter for general adult medical examination with abnormal findings: Secondary | ICD-10-CM | POA: Diagnosis not present

## 2022-07-11 DIAGNOSIS — E782 Mixed hyperlipidemia: Secondary | ICD-10-CM | POA: Diagnosis not present

## 2022-07-11 DIAGNOSIS — R5383 Other fatigue: Secondary | ICD-10-CM | POA: Diagnosis not present

## 2022-07-12 LAB — CBC WITH DIFFERENTIAL/PLATELET
Basophils Absolute: 0 10*3/uL (ref 0.0–0.2)
Basos: 1 %
EOS (ABSOLUTE): 0 10*3/uL (ref 0.0–0.4)
Eos: 1 %
Hematocrit: 38.6 % (ref 34.0–46.6)
Hemoglobin: 12.9 g/dL (ref 11.1–15.9)
Immature Grans (Abs): 0 10*3/uL (ref 0.0–0.1)
Immature Granulocytes: 0 %
Lymphocytes Absolute: 2.2 10*3/uL (ref 0.7–3.1)
Lymphs: 40 %
MCH: 30.6 pg (ref 26.6–33.0)
MCHC: 33.4 g/dL (ref 31.5–35.7)
MCV: 92 fL (ref 79–97)
Monocytes Absolute: 0.6 10*3/uL (ref 0.1–0.9)
Monocytes: 11 %
Neutrophils Absolute: 2.7 10*3/uL (ref 1.4–7.0)
Neutrophils: 47 %
Platelets: 228 10*3/uL (ref 150–450)
RBC: 4.22 x10E6/uL (ref 3.77–5.28)
RDW: 13.1 % (ref 11.7–15.4)
WBC: 5.6 10*3/uL (ref 3.4–10.8)

## 2022-07-12 LAB — COMPREHENSIVE METABOLIC PANEL
ALT: 16 IU/L (ref 0–32)
AST: 16 IU/L (ref 0–40)
Albumin/Globulin Ratio: 1.6 (ref 1.2–2.2)
Albumin: 4.4 g/dL (ref 3.8–4.9)
Alkaline Phosphatase: 70 IU/L (ref 44–121)
BUN/Creatinine Ratio: 20 (ref 9–23)
BUN: 17 mg/dL (ref 6–24)
Bilirubin Total: 0.4 mg/dL (ref 0.0–1.2)
CO2: 25 mmol/L (ref 20–29)
Calcium: 10 mg/dL (ref 8.7–10.2)
Chloride: 101 mmol/L (ref 96–106)
Creatinine, Ser: 0.87 mg/dL (ref 0.57–1.00)
Globulin, Total: 2.7 g/dL (ref 1.5–4.5)
Glucose: 114 mg/dL — ABNORMAL HIGH (ref 70–99)
Potassium: 4.4 mmol/L (ref 3.5–5.2)
Sodium: 139 mmol/L (ref 134–144)
Total Protein: 7.1 g/dL (ref 6.0–8.5)
eGFR: 79 mL/min/{1.73_m2} (ref >59)

## 2022-07-12 LAB — TSH+FREE T4
Free T4: 1.32 ng/dL (ref 0.82–1.77)
TSH: 1.1 u[IU]/mL (ref 0.450–4.500)

## 2022-07-12 LAB — LIPID PANEL WITH LDL/HDL RATIO
Cholesterol, Total: 159 mg/dL (ref 100–199)
HDL: 39 mg/dL — ABNORMAL LOW (ref >39)
LDL Chol Calc (NIH): 101 mg/dL — ABNORMAL HIGH (ref 0–99)
LDL/HDL Ratio: 2.6 ratio (ref 0.0–3.2)
Triglycerides: 100 mg/dL (ref 0–149)
VLDL Cholesterol Cal: 19 mg/dL (ref 5–40)

## 2022-07-13 LAB — MICROSCOPIC EXAMINATION
Casts: NONE SEEN /lpf
RBC, Urine: NONE SEEN /hpf (ref 0–2)

## 2022-07-13 LAB — UA/M W/RFLX CULTURE, ROUTINE
Bilirubin, UA: NEGATIVE
Glucose, UA: NEGATIVE
Ketones, UA: NEGATIVE
Leukocytes,UA: NEGATIVE
Nitrite, UA: NEGATIVE
Protein,UA: NEGATIVE
Specific Gravity, UA: 1.017 (ref 1.005–1.030)
Urobilinogen, Ur: 0.2 mg/dL (ref 0.2–1.0)
pH, UA: 5 (ref 5.0–7.5)

## 2022-07-13 LAB — URINE CULTURE, REFLEX

## 2022-07-13 LAB — MICROALBUMIN / CREATININE URINE RATIO
Creatinine, Urine: 74.8 mg/dL
Microalb/Creat Ratio: 9 mg/g creat (ref 0–29)
Microalbumin, Urine: 7.1 ug/mL

## 2022-07-25 ENCOUNTER — Telehealth: Payer: Self-pay

## 2022-07-25 ENCOUNTER — Other Ambulatory Visit: Payer: Self-pay | Admitting: Physician Assistant

## 2022-07-25 DIAGNOSIS — N3000 Acute cystitis without hematuria: Secondary | ICD-10-CM

## 2022-07-25 MED ORDER — NITROFURANTOIN MONOHYD MACRO 100 MG PO CAPS: ORAL_CAPSULE | ORAL | 0 refills | Status: DC

## 2022-07-25 NOTE — Telephone Encounter (Signed)
Spoke with patient regarding lab results and that Macrobid was sent to pharmacy.

## 2022-07-25 NOTE — Telephone Encounter (Signed)
-----   Message from Mylinda Latina, PA-C sent at 07/25/2022  9:03 AM EST ----- Please let her know that her cholesterol is improving and her other labs looked good. Her urine does show UTI and I am sending macrobid for her.

## 2022-07-27 ENCOUNTER — Other Ambulatory Visit: Payer: Self-pay

## 2022-07-27 DIAGNOSIS — E1165 Type 2 diabetes mellitus with hyperglycemia: Secondary | ICD-10-CM

## 2022-07-27 MED ORDER — TRULICITY 1.5 MG/0.5ML ~~LOC~~ SOAJ: SUBCUTANEOUS | 3 refills | Status: DC

## 2022-08-02 ENCOUNTER — Telehealth: Payer: Self-pay | Admitting: Physician Assistant

## 2022-08-03 ENCOUNTER — Other Ambulatory Visit: Payer: Self-pay | Admitting: Physician Assistant

## 2022-08-03 DIAGNOSIS — E119 Type 2 diabetes mellitus without complications: Secondary | ICD-10-CM

## 2022-08-03 MED ORDER — TIRZEPATIDE 2.5 MG/0.5ML ~~LOC~~ SOAJ: 2.5000 mg | SUBCUTANEOUS | 0 refills | Status: DC

## 2022-08-04 NOTE — Telephone Encounter (Signed)
Pt advised we send [facility]

## 2022-09-08 ENCOUNTER — Encounter: Payer: Self-pay | Admitting: Physician Assistant

## 2022-09-28 ENCOUNTER — Ambulatory Visit
Admission: RE | Admit: 2022-09-28 | Discharge: 2022-09-28 | Disposition: A | Discharge status: Home / Self Care | Payer: BC Managed Care – PPO | Source: Ambulatory Visit | Attending: Physician Assistant | Admitting: Physician Assistant

## 2022-09-28 DIAGNOSIS — Z1231 Encounter for screening mammogram for malignant neoplasm of breast: Secondary | ICD-10-CM | POA: Diagnosis not present

## 2022-10-16 ENCOUNTER — Encounter: Payer: Self-pay | Admitting: Physician Assistant

## 2022-10-16 ENCOUNTER — Ambulatory Visit (INDEPENDENT_AMBULATORY_CARE_PROVIDER_SITE_OTHER): Payer: BC Managed Care – PPO | Admitting: Physician Assistant

## 2022-10-16 VITALS — BP 130/75 | HR 62 | Temp 98.1°F | Resp 16 | Ht 66.0 in | Wt 230.0 lb

## 2022-10-16 DIAGNOSIS — Z79899 Other long term (current) drug therapy: Secondary | ICD-10-CM

## 2022-10-16 DIAGNOSIS — E119 Type 2 diabetes mellitus without complications: Secondary | ICD-10-CM | POA: Diagnosis not present

## 2022-10-16 DIAGNOSIS — I10 Essential (primary) hypertension: Secondary | ICD-10-CM

## 2022-10-16 LAB — POCT GLYCOSYLATED HEMOGLOBIN (HGB A1C): Hemoglobin A1C: 6.2 % — AB (ref 4.0–5.6)

## 2022-10-16 NOTE — Progress Notes (Signed)
[facility] [address] [place], [place] [address]  Internal MEDICINE  Office Visit Note  Patient Name: Lindsey Cruz  [id number]  [id number]  Date of Service: 10/20/2022  Chief Complaint  Patient presents with   Follow-up   Diabetes   Hypertension   Hyperlipidemia   Quality Metric Gaps    Shingles and TDAP    HPI Pt is here for routine follow up -Trulicity was backordered, so mounjaro sent for 4 weeks but then never refilled. Tolerated fine but decided to try keto diet plan instead and found fasting BG at 99. Has been off any DM meds for last 6 weeks -Larey Seat off of this diet plan due to restrictions, but still maintaining parts of it, for instance making homemade kale chips and still being more mindful with food choices -1st Shingles March 3rd and tdap, will check on next dose -declines pap today and will do it next visit -had mammogram and eye exam done, dental exam in August -BP stable at home  Current Medication: Outpatient Encounter Medications as of 10/16/2022  Medication Sig   Accu-Chek FastClix Lancets MISC Use as directed dx e11.65   ACCU-CHEK GUIDE test strip TEST BLOOD SUGAR EVERY DAY AS DIRECTED   atorvastatin (LIPITOR) 40 MG tablet TAKE 1 TABLET(40 MG) BY MOUTH DAILY   bisoprolol-hydrochlorothiazide (ZIAC) 2.5-6.25 MG tablet Take 1 tablet by mouth daily.   hydrochlorothiazide (MICROZIDE) 12.5 MG capsule Take 1 capsule (12.5 mg total) by mouth daily.   Multiple Vitamins-Minerals (EMERGEN-C VITAMIN D/CALCIUM PO) Take by mouth.   [DISCONTINUED] nitrofurantoin, macrocrystal-monohydrate, (MACROBID) 100 MG capsule Take 1 cap twice per day for 10 days.   [DISCONTINUED] SUMAtriptan (IMITREX) 100 MG tablet    [DISCONTINUED] tirzepatide (MOUNJARO) 2.5 MG/0.5ML Pen Inject 2.5 mg into the skin once a week.   No facility-administered encounter medications on file as of 10/16/2022.    Surgical History: Past Surgical History:  Procedure Laterality Date   BREAST  CYST ASPIRATION Right 2014   neg   BREAST EXCISIONAL BIOPSY Right 1983   neg   COLONOSCOPY WITH PROPOFOL N/A 08/05/2021   Procedure: COLONOSCOPY WITH PROPOFOL;  Surgeon: Toney Reil, MD;  Location: [facility];  Service: Gastroenterology;  Laterality: N/A;   TUBAL LIGATION      Medical History: Past Medical History:  Diagnosis Date   Diabetes mellitus without complication (HCC)    Hypercholesteremia    Hypertension     Family History: Family History  Problem Relation Age of Onset   Hypertension Mother    Hyperlipidemia Mother    Diabetes Mother    Diabetes Maternal Grandmother    Breast cancer Neg Hx     Social History   Socioeconomic History   Marital status: Married    Spouse name: Not on file   Number of children: Not on file   Years of education: Not on file   Highest education level: Not on file  Occupational History   Not on file  Tobacco Use   Smoking status: Never   Smokeless tobacco: Never  Vaping Use   Vaping Use: Never used  Substance and Sexual Activity   Alcohol use: Yes    Comment: occasional   Drug use: Never   Sexual activity: Not on file  Other Topics Concern   Not on file  Social History Narrative   Not on file   Social Determinants of Health   Financial Resource Strain: Not on file  Food Insecurity: Not on file  Transportation Needs:  Not on file  Physical Activity: Not on file  Stress: Not on file  Social Connections: Not on file  Intimate Partner Violence: Not on file      Review of Systems  Constitutional:  Negative for chills and unexpected weight change.  HENT:  Negative for congestion, postnasal drip, rhinorrhea, sneezing and sore throat.   Eyes:  Negative for redness.  Respiratory:  Negative for cough, chest tightness and shortness of breath.   Cardiovascular:  Negative for chest pain and palpitations.  Gastrointestinal:  Negative for abdominal pain, constipation, diarrhea, nausea and vomiting.  Genitourinary:   Negative for dysuria and frequency.  Musculoskeletal:  Negative for back pain, joint swelling and neck pain.  Skin:  Negative for rash.  Neurological: Negative.  Negative for tremors and numbness.  Hematological:  Negative for adenopathy. Does not bruise/bleed easily.  Psychiatric/Behavioral:  Negative for behavioral problems (Depression), sleep disturbance and suicidal ideas. The patient is not nervous/anxious.     Vital Signs: BP 130/75   Pulse 62   Temp 98.1 F (36.7 C)   Resp 16   Ht 5\' 6"  (1.676 m)   Wt 230 lb (104.3 kg)   SpO2 98%   BMI 37.12 kg/m    Physical Exam Vitals and nursing note reviewed.  Constitutional:      General: She is not in acute distress.    Appearance: She is well-developed. She is obese. She is not diaphoretic.  HENT:     Head: Normocephalic and atraumatic.     Mouth/Throat:     Pharynx: No oropharyngeal exudate.  Eyes:     Pupils: Pupils are equal, round, and reactive to light.  Neck:     Thyroid: No thyromegaly.     Vascular: No JVD.     Trachea: No tracheal deviation.  Cardiovascular:     Rate and Rhythm: Normal rate and regular rhythm.     Heart sounds: Normal heart sounds. No murmur heard.    No friction rub. No gallop.  Pulmonary:     Effort: Pulmonary effort is normal. No respiratory distress.     Breath sounds: No wheezing or rales.  Chest:     Chest wall: No tenderness.  Abdominal:     General: Bowel sounds are normal.     Palpations: Abdomen is soft.  Musculoskeletal:        General: Normal range of motion.     Cervical back: Normal range of motion and neck supple.  Lymphadenopathy:     Cervical: No cervical adenopathy.  Skin:    General: Skin is warm and dry.  Neurological:     Mental Status: She is alert and oriented to person, place, and time.     Cranial Nerves: No cranial nerve deficit.     Sensory: No sensory deficit.  Psychiatric:        Behavior: Behavior normal.        Thought Content: Thought content normal.         Judgment: Judgment normal.        Assessment/Plan: 1. Type 2 diabetes mellitus without complication, without long-term current use of insulin (HCC) - POCT HgB A1C is 6.2 which is improved from 6.3 last visit despite being off of meds for 6 weeks and will continue to control with diet and exercise  2. Essential hypertension Stable, continue current medication  3. On statin therapy Continue lipitor  General Counseling: Analyce verbalizes understanding of the findings of todays visit and agrees with plan of treatment.  I have discussed any further diagnostic evaluation that may be needed or ordered today. We also reviewed her medications today. she has been encouraged to call the office with any questions or concerns that should arise related to todays visit.    Orders Placed This Encounter  Procedures   POCT HgB A1C    No orders of the defined types were placed in this encounter.   This patient was seen by Lynn Ito, PA-C in collaboration with Dr. Beverely Risen as a part of collaborative care agreement.   Total time spent:30 Minutes Time spent includes review of chart, medications, test results, and follow up plan with the patient.      Dr Lyndon Code Internal medicine

## 2022-12-15 ENCOUNTER — Other Ambulatory Visit: Payer: Self-pay

## 2022-12-15 DIAGNOSIS — I10 Essential (primary) hypertension: Secondary | ICD-10-CM

## 2022-12-15 MED ORDER — BISOPROLOL-HYDROCHLOROTHIAZIDE 2.5-6.25 MG PO TABS
1.0000 | ORAL_TABLET | Freq: Every day | ORAL | 1 refills | Status: DC
Start: 2022-12-15 — End: 2023-06-22

## 2022-12-16 ENCOUNTER — Other Ambulatory Visit: Payer: Self-pay | Admitting: Physician Assistant

## 2022-12-18 ENCOUNTER — Other Ambulatory Visit: Payer: Self-pay

## 2023-01-02 ENCOUNTER — Telehealth: Payer: BC Managed Care – PPO | Admitting: Physician Assistant

## 2023-01-02 DIAGNOSIS — J0100 Acute maxillary sinusitis, unspecified: Secondary | ICD-10-CM | POA: Diagnosis not present

## 2023-01-02 MED ORDER — FLUTICASONE PROPIONATE 50 MCG/ACT NA SUSP
2.0000 | Freq: Every day | NASAL | 0 refills | Status: DC
Start: 2023-01-02 — End: 2024-03-27

## 2023-01-02 MED ORDER — DOXYCYCLINE HYCLATE 100 MG PO TABS
100.0000 mg | ORAL_TABLET | Freq: Two times a day (BID) | ORAL | 0 refills | Status: DC
Start: 2023-01-02 — End: 2023-11-22

## 2023-01-02 NOTE — Patient Instructions (Signed)
Margaretmary Lombard, thank you for joining [name], PA-C for today's virtual visit.  While this provider is not your primary care provider (PCP), if your PCP is located in our provider database this encounter information will be shared with them immediately following your visit.   A [facility] MyChart account gives you access to today's visit and all your visits, tests, and labs performed at [facility] " click here if you don't have a [facility] MyChart account or go to [email or web address]  Consent: (Patient) ALPHA CHOUINARD provided verbal consent for this virtual visit at the beginning of the encounter.  Current Medications:  Current Outpatient Medications:    Accu-Chek FastClix Lancets MISC, Use as directed dx e11.65, Disp: 102 each, Rfl: 11   ACCU-CHEK GUIDE test strip, TEST BLOOD SUGAR EVERY DAY AS DIRECTED, Disp: 100 strip, Rfl: 3   atorvastatin (LIPITOR) 40 MG tablet, TAKE 1 TABLET(40 MG) BY MOUTH DAILY, Disp: 90 tablet, Rfl: 3   bisoprolol-hydrochlorothiazide (ZIAC) 2.5-6.25 MG tablet, Take 1 tablet by mouth daily., Disp: 90 tablet, Rfl: 1   hydrochlorothiazide (MICROZIDE) 12.5 MG capsule, TAKE 1 CAPSULE(12.5 MG) BY MOUTH DAILY, Disp: 90 capsule, Rfl: 1   Multiple Vitamins-Minerals (EMERGEN-C VITAMIN D/CALCIUM PO), Take by mouth., Disp: , Rfl:    Medications ordered in this encounter:  No orders of the defined types were placed in this encounter.    *If you need refills on other medications prior to your next appointment, please contact your pharmacy*  Follow-Up: Call back or seek an in-person evaluation if the symptoms worsen or if the condition fails to improve as anticipated.  [facility] [phone]  Other Instructions Please take antibiotic as directed.  Increase fluid intake.  Use Saline nasal spray.  Take a daily multivitamin. Use the Flonase as directed.  Place a humidifier in the bedroom.  Please call or return clinic if  symptoms are not improving.  Sinusitis Sinusitis is redness, soreness, and swelling (inflammation) of the paranasal sinuses. Paranasal sinuses are air pockets within the bones of your face (beneath the eyes, the middle of the forehead, or above the eyes). In healthy paranasal sinuses, mucus is able to drain out, and air is able to circulate through them by way of your nose. However, when your paranasal sinuses are inflamed, mucus and air can become trapped. This can allow bacteria and other germs to grow and cause infection. Sinusitis can develop quickly and last only a short time (acute) or continue over a long period (chronic). Sinusitis that lasts for more than 12 weeks is considered chronic.  CAUSES  Causes of sinusitis include: Allergies. Structural abnormalities, such as displacement of the cartilage that separates your nostrils (deviated septum), which can decrease the air flow through your nose and sinuses and affect sinus drainage. Functional abnormalities, such as when the small hairs (cilia) that line your sinuses and help remove mucus do not work properly or are not present. SYMPTOMS  Symptoms of acute and chronic sinusitis are the same. The primary symptoms are pain and pressure around the affected sinuses. Other symptoms include: Upper toothache. Earache. Headache. Bad breath. Decreased sense of smell and taste. A cough, which worsens when you are lying flat. Fatigue. Fever. Thick drainage from your nose, which often is green and may contain pus (purulent). Swelling and warmth over the affected sinuses. DIAGNOSIS  Your caregiver will perform a physical exam. During the exam, your caregiver may: Look in your nose for signs of abnormal growths  in your nostrils (nasal polyps). Tap over the affected sinus to check for signs of infection. View the inside of your sinuses (endoscopy) with a special imaging device with a light attached (endoscope), which is inserted into your  sinuses. If your caregiver suspects that you have chronic sinusitis, one or more of the following tests may be recommended: Allergy tests. Nasal culture A sample of mucus is taken from your nose and sent to a lab and screened for bacteria. Nasal cytology A sample of mucus is taken from your nose and examined by your caregiver to determine if your sinusitis is related to an allergy. TREATMENT  Most cases of acute sinusitis are related to a viral infection and will resolve on their own within 10 days. Sometimes medicines are prescribed to help relieve symptoms (pain medicine, decongestants, nasal steroid sprays, or saline sprays).  However, for sinusitis related to a bacterial infection, your caregiver will prescribe antibiotic medicines. These are medicines that will help kill the bacteria causing the infection.  Rarely, sinusitis is caused by a fungal infection. In theses cases, your caregiver will prescribe antifungal medicine. For some cases of chronic sinusitis, surgery is needed. Generally, these are cases in which sinusitis recurs more than 3 times per year, despite other treatments. HOME CARE INSTRUCTIONS  Drink plenty of water. Water helps thin the mucus so your sinuses can drain more easily. Use a humidifier. Inhale steam 3 to 4 times a day (for example, sit in the bathroom with the shower running). Apply a warm, moist washcloth to your face 3 to 4 times a day, or as directed by your caregiver. Use saline nasal sprays to help moisten and clean your sinuses. Take over-the-counter or prescription medicines for pain, discomfort, or fever only as directed by your caregiver. SEEK IMMEDIATE MEDICAL CARE IF: You have increasing pain or severe headaches. You have nausea, vomiting, or drowsiness. You have swelling around your face. You have vision problems. You have a stiff neck. You have difficulty breathing. MAKE SURE YOU:  Understand these instructions. Will watch your condition. Will  get help right away if you are not doing well or get worse. Document Released: 05/29/2005 Document Revised: 08/21/2011 Document Reviewed: 06/13/2011 [facility] Patient Information 2014 [place], [place].    If you have been instructed to have an in-person evaluation today at a local Urgent Care facility, please use the link below. It will take you to a list of all of our available [facility], including address, phone number and hours of operation. Please do not delay care.  [facility]  If you or a family member do not have a primary care provider, use the link below to schedule a visit and establish care. When you choose a [facility] primary care physician or advanced practice provider, you gain a long-term partner in health. Find a Primary Care Provider  Learn more about [facility]'s in-office and virtual care options: [facility] - Get Care Now

## 2023-01-02 NOTE — Progress Notes (Signed)
Virtual Visit Consent   Lindsey Cruz, you are scheduled for a virtual visit with a [facility] provider today. Just as with appointments in the office, your consent must be obtained to participate. Your consent will be active for this visit and any virtual visit you may have with one of our providers in the next 365 days. If you have a MyChart account, a copy of this consent can be sent to you electronically.  As this is a virtual visit, video technology does not allow for your provider to perform a traditional examination. This may limit your provider's ability to fully assess your condition. If your provider identifies any concerns that need to be evaluated in person or the need to arrange testing (such as labs, EKG, etc.), we will make arrangements to do so. Although advances in technology are sophisticated, we cannot ensure that it will always work on either your end or our end. If the connection with a video visit is poor, the visit may have to be switched to a telephone visit. With either a video or telephone visit, we are not always able to ensure that we have a secure connection.  By engaging in this virtual visit, you consent to the provision of healthcare and authorize for your insurance to be billed (if applicable) for the services provided during this visit. Depending on your insurance coverage, you may receive a charge related to this service.  I need to obtain your verbal consent now. Are you willing to proceed with your visit today? Lindsey Cruz has provided verbal consent on 01/02/2023 for a virtual visit (video or telephone). [name], [place]  Date: 01/02/2023 5:38 PM  Virtual Visit via Video Note   I, [name], connected with  Lindsey Cruz  ([id number], 12-Dec-1965) on 01/02/23 at  5:30 PM EDT by a video-enabled telemedicine application and verified that I am speaking with the correct person using two identifiers.  Location: Patient: Virtual Visit Location  Patient: Home Provider: Virtual Visit Location Provider: Home Office   I discussed the limitations of evaluation and management by telemedicine and the availability of in person appointments. The patient expressed understanding and agreed to proceed.    History of Present Illness: Lindsey Cruz is a 57 y.o. who identifies as a female who was assigned female at birth, and is being seen today for COVID-19 exposure and ongoing symptoms. Son positive for COVID. Patient endorses symptoms starting last Thursday. Rhinorrhea and starting with aching in her joints along with malaise. Started Coricidin-HBP. Later that day, noting heaviness in her head and mild HA. Increased fluids and rest. Thursday night with upset stomach, slightly improved by Friday. Followed a BRAT diet. Noted nasal congestion, continued sinus pressure, aching since then. Had tested for COVID on 4 separate days and negative. Notes congestion is improved but substantial facial pain and sinus pain. Top teeth are hurting. Denies fever at present.    BP 136/72  HPI: HPI  Problems:  Patient Active Problem List   Diagnosis Date Noted   History of colonic polyps    Type 2 diabetes mellitus with hyperglycemia (HCC) 09/24/2019   Mixed hyperlipidemia 09/24/2019   Vaginal candidiasis 09/22/2018   Diabetes mellitus without complication (HCC) 02/27/2018   Essential hypertension 02/27/2018    Allergies: No Known Allergies Medications:  Current Outpatient Medications:    doxycycline (VIBRA-TABS) 100 MG tablet, Take 1 tablet (100 mg total) by mouth 2 (two) times daily., Disp: 20 tablet, Rfl: 0  fluticasone (FLONASE) 50 MCG/ACT nasal spray, Place 2 sprays into both nostrils daily., Disp: 16 g, Rfl: 0   Accu-Chek FastClix Lancets MISC, Use as directed dx e11.65, Disp: 102 each, Rfl: 11   ACCU-CHEK GUIDE test strip, TEST BLOOD SUGAR EVERY DAY AS DIRECTED, Disp: 100 strip, Rfl: 3   atorvastatin (LIPITOR) 40 MG tablet, TAKE 1 TABLET(40 MG) BY  MOUTH DAILY, Disp: 90 tablet, Rfl: 3   bisoprolol-hydrochlorothiazide (ZIAC) 2.5-6.25 MG tablet, Take 1 tablet by mouth daily., Disp: 90 tablet, Rfl: 1   hydrochlorothiazide (MICROZIDE) 12.5 MG capsule, TAKE 1 CAPSULE(12.5 MG) BY MOUTH DAILY, Disp: 90 capsule, Rfl: 1   Multiple Vitamins-Minerals (EMERGEN-C VITAMIN D/CALCIUM PO), Take by mouth., Disp: , Rfl:   Observations/Objective: Patient is well-developed, well-nourished in no acute distress.  Resting comfortably at home.  Head is normocephalic, atraumatic.  No labored breathing. Speech is clear and coherent with logical content.  Patient is alert and oriented at baseline.   Assessment and Plan: 1. Acute non-recurrent maxillary sinusitis - fluticasone (FLONASE) 50 MCG/ACT nasal spray; Place 2 sprays into both nostrils daily.  Dispense: 16 g; Refill: 0 - doxycycline (VIBRA-TABS) 100 MG tablet; Take 1 tablet (100 mg total) by mouth 2 (two) times daily.  Dispense: 20 tablet; Refill: 0  Question if she has had false negatives on COVID but giving this double worsening, will cover for bacterial sinusitis. Rx Doxycycline.  Increase fluids.  Rest.  Saline nasal spray.  Probiotic.  Mucinex as directed.  Humidifier in bedroom. Flonase per orders.  Call or return to clinic if symptoms are not improving.   Follow Up Instructions: I discussed the assessment and treatment plan with the patient. The patient was provided an opportunity to ask questions and all were answered. The patient agreed with the plan and demonstrated an understanding of the instructions.  A copy of instructions were sent to the patient via MyChart unless otherwise noted below.   The patient was advised to call back or seek an in-person evaluation if the symptoms worsen or if the condition fails to improve as anticipated.  Time:  I spent 10 minutes with the patient via telehealth technology discussing the above problems/concerns.    [name], PA-C

## 2023-01-03 ENCOUNTER — Telehealth: Payer: Self-pay | Admitting: Physician Assistant

## 2023-01-03 NOTE — Telephone Encounter (Signed)
Patient lvm on after-hour line yesterday evening to schedule acute appointment. Left her vm this morning to return call-Toni

## 2023-01-18 ENCOUNTER — Ambulatory Visit (INDEPENDENT_AMBULATORY_CARE_PROVIDER_SITE_OTHER): Payer: BC Managed Care – PPO | Admitting: Physician Assistant

## 2023-01-18 ENCOUNTER — Encounter: Payer: Self-pay | Admitting: Physician Assistant

## 2023-01-18 VITALS — BP 130/80 | HR 63 | Temp 97.8°F | Resp 16 | Ht 66.0 in | Wt 234.0 lb

## 2023-01-18 DIAGNOSIS — I10 Essential (primary) hypertension: Secondary | ICD-10-CM | POA: Diagnosis not present

## 2023-01-18 DIAGNOSIS — E119 Type 2 diabetes mellitus without complications: Secondary | ICD-10-CM | POA: Diagnosis not present

## 2023-01-18 LAB — POCT GLYCOSYLATED HEMOGLOBIN (HGB A1C): Hemoglobin A1C: 6.6 % — AB (ref 4.0–5.6)

## 2023-01-18 NOTE — Progress Notes (Signed)
[facility] [address] [place], [place] [address]  Internal MEDICINE  Office Visit Note  Patient Name: Lindsey Cruz  [id number]  [id number]  Date of Service: 01/23/2023  Chief Complaint  Patient presents with   Follow-up   Diabetes   Hypertension    HPI Pt is here for routine follow up -changed eating habits a few months ago to help with sugars, but now stomach grumbling after eating and goes to bathroom shortly after. Has been eating more greens and grains. Lots of fiber. Beets. Low salt. Lots of fruits. -No abdominal pain, nausea, vomiting, or diarrhea. No blood in stool.  -Going to start adjusting diet back some and limiting some of the fruit as this can raise sugars too -BG at home not checked and will start back on this  Current Medication: Outpatient Encounter Medications as of 01/18/2023  Medication Sig   Accu-Chek FastClix Lancets MISC Use as directed dx e11.65   ACCU-CHEK GUIDE test strip TEST BLOOD SUGAR EVERY DAY AS DIRECTED   atorvastatin (LIPITOR) 40 MG tablet TAKE 1 TABLET(40 MG) BY MOUTH DAILY   bisoprolol-hydrochlorothiazide (ZIAC) 2.5-6.25 MG tablet Take 1 tablet by mouth daily.   doxycycline (VIBRA-TABS) 100 MG tablet Take 1 tablet (100 mg total) by mouth 2 (two) times daily.   fluticasone (FLONASE) 50 MCG/ACT nasal spray Place 2 sprays into both nostrils daily.   hydrochlorothiazide (MICROZIDE) 12.5 MG capsule TAKE 1 CAPSULE(12.5 MG) BY MOUTH DAILY   Multiple Vitamins-Minerals (EMERGEN-C VITAMIN D/CALCIUM PO) Take by mouth.   No facility-administered encounter medications on file as of 01/18/2023.    Surgical History: Past Surgical History:  Procedure Laterality Date   BREAST CYST ASPIRATION Right 2014   neg   BREAST EXCISIONAL BIOPSY Right 1983   neg   COLONOSCOPY WITH PROPOFOL N/A 08/05/2021   Procedure: COLONOSCOPY WITH PROPOFOL;  Surgeon: Toney Reil, MD;  Location: [facility] ENDOSCOPY;  Service: Gastroenterology;  Laterality: N/A;    TUBAL LIGATION      Medical History: Past Medical History:  Diagnosis Date   Diabetes mellitus without complication (HCC)    Hypercholesteremia    Hypertension     Family History: Family History  Problem Relation Age of Onset   Hypertension Mother    Hyperlipidemia Mother    Diabetes Mother    Diabetes Maternal Grandmother    Breast cancer Neg Hx     Social History   Socioeconomic History   Marital status: Married    Spouse name: Not on file   Number of children: Not on file   Years of education: Not on file   Highest education level: Not on file  Occupational History   Not on file  Tobacco Use   Smoking status: Never   Smokeless tobacco: Never  Vaping Use   Vaping status: Never Used  Substance and Sexual Activity   Alcohol use: Yes    Comment: occasional   Drug use: Never   Sexual activity: Not on file  Other Topics Concern   Not on file  Social History Narrative   Not on file   Social Determinants of Health   Financial Resource Strain: Not on file  Food Insecurity: Not on file  Transportation Needs: Not on file  Physical Activity: Not on file  Stress: Not on file  Social Connections: Not on file  Intimate Partner Violence: Not on file      Review of Systems  Constitutional:  Negative for chills and unexpected weight change.  HENT:  Negative for congestion, postnasal drip, rhinorrhea, sneezing and sore throat.   Eyes:  Negative for redness.  Respiratory:  Negative for cough, chest tightness and shortness of breath.   Cardiovascular:  Negative for chest pain and palpitations.  Gastrointestinal:  Negative for abdominal pain, blood in stool, constipation, diarrhea, nausea and vomiting.  Genitourinary:  Negative for dysuria and frequency.  Musculoskeletal:  Negative for back pain, joint swelling and neck pain.  Skin:  Negative for rash.  Neurological: Negative.  Negative for tremors and numbness.  Hematological:  Negative for adenopathy. Does not  bruise/bleed easily.  Psychiatric/Behavioral:  Negative for behavioral problems (Depression), sleep disturbance and suicidal ideas. The patient is not nervous/anxious.     Vital Signs: BP 130/80   Pulse 63   Temp 97.8 F (36.6 C)   Resp 16   Ht 5\' 6"  (1.676 m)   Wt 234 lb (106.1 kg)   SpO2 96%   BMI 37.77 kg/m    Physical Exam Vitals and nursing note reviewed.  Constitutional:      General: She is not in acute distress.    Appearance: She is well-developed. She is obese. She is not diaphoretic.  HENT:     Head: Normocephalic and atraumatic.     Mouth/Throat:     Pharynx: No oropharyngeal exudate.  Eyes:     Pupils: Pupils are equal, round, and reactive to light.  Neck:     Thyroid: No thyromegaly.     Vascular: No JVD.     Trachea: No tracheal deviation.  Cardiovascular:     Rate and Rhythm: Normal rate and regular rhythm.     Heart sounds: Normal heart sounds. No murmur heard.    No friction rub. No gallop.  Pulmonary:     Effort: Pulmonary effort is normal. No respiratory distress.     Breath sounds: No wheezing or rales.  Chest:     Chest wall: No tenderness.  Abdominal:     General: Bowel sounds are normal.     Palpations: Abdomen is soft.  Musculoskeletal:        General: Normal range of motion.     Cervical back: Normal range of motion and neck supple.  Lymphadenopathy:     Cervical: No cervical adenopathy.  Skin:    General: Skin is warm and dry.  Neurological:     Mental Status: She is alert and oriented to person, place, and time.     Cranial Nerves: No cranial nerve deficit.     Sensory: No sensory deficit.  Psychiatric:        Behavior: Behavior normal.        Thought Content: Thought content normal.        Judgment: Judgment normal.        Assessment/Plan: 1. Type 2 diabetes mellitus without complication, without long-term current use of insulin (HCC) - POCT HgB A1C is 6.6 which is up from last visit, but still controlled with diet and  exercise. Will continue to monitor as she readjusts diet  2. Essential hypertension Stable, continue current medications   General Counseling: Lindsey Cruz verbalizes understanding of the findings of todays visit and agrees with plan of treatment. I have discussed any further diagnostic evaluation that may be needed or ordered today. We also reviewed her medications today. she has been encouraged to call the office with any questions or concerns that should arise related to todays visit.    Orders Placed This Encounter  Procedures   POCT HgB A1C  No orders of the defined types were placed in this encounter.   This patient was seen by Lynn Ito, PA-C in collaboration with Dr. Beverely Risen as a part of collaborative care agreement.   Total time spent:30 Minutes Time spent includes review of chart, medications, test results, and follow up plan with the patient.      Dr Lyndon Code Internal medicine

## 2023-02-08 ENCOUNTER — Ambulatory Visit: Payer: BC Managed Care – PPO

## 2023-02-08 DIAGNOSIS — S161XXA Strain of muscle, fascia and tendon at neck level, initial encounter: Secondary | ICD-10-CM | POA: Diagnosis not present

## 2023-02-08 DIAGNOSIS — M542 Cervicalgia: Secondary | ICD-10-CM | POA: Diagnosis not present

## 2023-02-08 DIAGNOSIS — M7551 Bursitis of right shoulder: Secondary | ICD-10-CM | POA: Diagnosis not present

## 2023-02-08 DIAGNOSIS — M25511 Pain in right shoulder: Secondary | ICD-10-CM | POA: Diagnosis not present

## 2023-02-09 ENCOUNTER — Other Ambulatory Visit: Payer: Self-pay | Admitting: Internal Medicine

## 2023-02-09 DIAGNOSIS — E782 Mixed hyperlipidemia: Secondary | ICD-10-CM

## 2023-02-21 ENCOUNTER — Other Ambulatory Visit: Payer: Self-pay | Admitting: Internal Medicine

## 2023-02-21 DIAGNOSIS — E782 Mixed hyperlipidemia: Secondary | ICD-10-CM

## 2023-02-23 DIAGNOSIS — M25511 Pain in right shoulder: Secondary | ICD-10-CM | POA: Diagnosis not present

## 2023-02-25 ENCOUNTER — Emergency Department
Admission: EM | Admit: 2023-02-25 | Discharge: 2023-02-25 | Disposition: A | Discharge status: Home / Self Care | Payer: BC Managed Care – PPO | Attending: Emergency Medicine | Admitting: Emergency Medicine

## 2023-02-25 ENCOUNTER — Other Ambulatory Visit: Payer: Self-pay

## 2023-02-25 DIAGNOSIS — E119 Type 2 diabetes mellitus without complications: Secondary | ICD-10-CM | POA: Insufficient documentation

## 2023-02-25 DIAGNOSIS — T7840XA Allergy, unspecified, initial encounter: Secondary | ICD-10-CM

## 2023-02-25 MED ORDER — FAMOTIDINE 20 MG PO TABS
20.0000 mg | ORAL_TABLET | Freq: Once | ORAL | Status: AC
  Administered 2023-02-25: 20 mg via ORAL
  Filled 2023-02-25: qty 1

## 2023-02-25 MED ORDER — DIPHENHYDRAMINE HCL 25 MG PO CAPS
25.0000 mg | ORAL_CAPSULE | Freq: Once | ORAL | Status: AC
  Administered 2023-02-25: 25 mg via ORAL
  Filled 2023-02-25: qty 1

## 2023-02-25 MED ORDER — PREDNISONE 10 MG PO TABS: 30.0000 mg | ORAL_TABLET | Freq: Every day | ORAL | 0 refills | Status: AC

## 2023-02-25 MED ORDER — HYDROXYZINE HCL 25 MG PO TABS: 25.0000 mg | ORAL_TABLET | Freq: Three times a day (TID) | ORAL | 0 refills | Status: AC | PRN

## 2023-02-25 MED ORDER — FAMOTIDINE 20 MG PO TABS: 20.0000 mg | ORAL_TABLET | Freq: Every day | ORAL | 0 refills | Status: DC

## 2023-02-25 MED ORDER — PREDNISONE 20 MG PO TABS
60.0000 mg | ORAL_TABLET | Freq: Once | ORAL | Status: AC
  Administered 2023-02-25: 60 mg via ORAL
  Filled 2023-02-25: qty 3

## 2023-02-25 NOTE — ED Triage Notes (Addendum)
Pt sts that she ate some shrimp today and than just started to feel itching in her tongue and throat. Pt sts that she has two insect bites on her left ankle and has been treating it with benadryl. Pt sts that she has been able to eat shrimp with no problems in the past. Pt is A/Ox4, NAD, able to speak in full sentences.

## 2023-02-25 NOTE — ED Notes (Signed)
Patient declined discharge vital signs. 

## 2023-02-25 NOTE — Discharge Instructions (Addendum)
You were seen in the emergency department with concerns for a seafood allergy to shellfish.  You can take Benadryl or nondrowsy Benadryl (hydroxyzine) every 6 hours as needed for itching.  You are given a prescription for steroid and famotidine.  Return to the emergency department if you have significantly worsening of symptoms and trouble breathing.  Follow-up with your primary care physician and discuss referral to an allergist for further allergy testing.  Prednisone -you are given a prescription for a steroid.  It is important that you take this medication with food.  This medication can cause an upset stomach.  It also can increase your glucose if you have a history of diabetes, so it is important that you check your glucose frequently while you are on this medication.

## 2023-02-25 NOTE — ED Provider Notes (Signed)
[facility] Provider Note    Event Date/Time   First MD Initiated Contact with Patient 02/25/23 2111     (approximate)   History   Allergic Reaction   HPI  Lindsey Cruz is a 57 y.o. female past medical history significant for diabetes who presents to the emergency department with symptoms concerning for an allergic reaction.  Patient states that she was fixing dinner and had a bite of strip and red snapper and approximately 30 minutes later started having itching in her tongue and throat.  Body itching.  Denies any feeling like her throat is closing.  No shortness of breath or wheezing.  No history of an allergic reaction in the past.  Recently bit by something approximately 1 week ago to her left ankle.     Physical Exam   Triage Vital Signs: ED Triage Vitals  Encounter Vitals Group     BP 02/25/23 1941 (!) 142/99     Systolic BP Percentile --      Diastolic BP Percentile --      Pulse Rate 02/25/23 1941 72     Resp 02/25/23 1941 17     Temp 02/25/23 1941 (!) 97.5 F (36.4 C)     Temp Source 02/25/23 1941 Oral     SpO2 02/25/23 1941 97 %     Weight 02/25/23 1942 240 lb (108.9 kg)     Height 02/25/23 1942 5\' 6"  (1.676 m)     Head Circumference --      Peak Flow --      Pain Score 02/25/23 1942 0     Pain Loc --      Pain Education --      Exclude from Growth Chart --     Most recent vital signs: Vitals:   02/25/23 1941 02/25/23 2025  BP: (!) 142/99 (!) 136/58  Pulse: 72 78  Resp: 17 16  Temp: (!) 97.5 F (36.4 C)   SpO2: 97% 99%    Physical Exam Constitutional:      Appearance: She is well-developed.  HENT:     Head: Atraumatic.  Eyes:     Conjunctiva/sclera: Conjunctivae normal.  Cardiovascular:     Rate and Rhythm: Regular rhythm.  Pulmonary:     Effort: No respiratory distress.  Abdominal:     General: There is no distension.  Musculoskeletal:        General: Normal range of motion.     Cervical back: Normal range of  motion.     Comments: Left ankle with 1 bite wound with mild surrounding edema.  No erythema warmth or induration.  Skin:    General: Skin is warm.  Neurological:     Mental Status: She is alert. Mental status is at baseline.     IMPRESSION / MDM / ASSESSMENT AND PLAN / ED COURSE  I reviewed the triage vital signs and the nursing notes.  Differential diagnosis including allergic reaction, serum sickness.  Have a low suspicion for anaphylactic reaction.  Most likely having allergic reaction secondary to shellfish.  LABS (all labs ordered are listed, but only abnormal results are displayed) Labs interpreted as -    Labs Reviewed - No data to display   MDM Treated with Benadryl, steroids and famotidine.  Protecting her airway with no symptoms consistent with anaphylactic reaction.  Will discharge the patient with Atarax, steroid and famotidine.  Discussed follow-up with primary care physician and referral to allergy specialist.  Given return precautions.  Discussed avoiding shellfish.      PROCEDURES:  Critical Care performed: No  Procedures  Patient's presentation is most consistent with acute complicated illness / injury requiring diagnostic workup.   MEDICATIONS ORDERED IN ED: Medications  diphenhydrAMINE (BENADRYL) capsule 25 mg (25 mg Oral Given 02/25/23 2143)  predniSONE (DELTASONE) tablet 60 mg (60 mg Oral Given 02/25/23 2143)  famotidine (PEPCID) tablet 20 mg (20 mg Oral Given 02/25/23 2143)    FINAL CLINICAL IMPRESSION(S) / ED DIAGNOSES   Final diagnoses:  Allergic reaction, initial encounter     Rx / DC Orders   ED Discharge Orders          Ordered    hydrOXYzine (ATARAX) 25 MG tablet  3 times daily PRN        02/25/23 2146    predniSONE (DELTASONE) 10 MG tablet  Daily        02/25/23 2146    famotidine (PEPCID) 20 MG tablet  Daily        02/25/23 2146             Note:  This document was prepared using Dragon voice recognition software and  may include unintentional dictation errors.   Corena Herter, MD 02/25/23 2200

## 2023-03-01 ENCOUNTER — Encounter: Payer: Self-pay | Admitting: Physician Assistant

## 2023-03-01 ENCOUNTER — Ambulatory Visit: Payer: BC Managed Care – PPO | Admitting: Physician Assistant

## 2023-03-01 ENCOUNTER — Telehealth: Payer: Self-pay | Admitting: Physician Assistant

## 2023-03-01 VITALS — BP 128/80 | HR 80 | Temp 97.8°F | Resp 16 | Ht 66.0 in | Wt 234.4 lb

## 2023-03-01 DIAGNOSIS — T7840XA Allergy, unspecified, initial encounter: Secondary | ICD-10-CM | POA: Diagnosis not present

## 2023-03-01 MED ORDER — EPINEPHRINE 0.3 MG/0.3ML IJ SOAJ
0.3000 mg | INTRAMUSCULAR | 0 refills | Status: AC | PRN
Start: 2023-03-01 — End: ?

## 2023-03-01 NOTE — Progress Notes (Signed)
[facility] [address] [place], [place] [address]  Internal MEDICINE  Office Visit Note  Patient Name: Lindsey Cruz  [id number]  [id number]  Date of Service: 03/01/2023  Chief Complaint  Patient presents with   Hospitalization Follow-up    HPI Pt is here for ED follow up -Went to ED on Sunday with allergic reaction -She was preparing fish and shrimp tacos and had a taste of shrimp before leaving to drive to her mom's to take her a plate and started itching so turned around and took benadryl. Also had a mosquito bite on ankle as well.  -Whole back started itching, scalp, tops of feet all itchy and splotchy. Inside of nose itching as well and was more congested and did not respond to benadryl so went to ED -No lip or tongue swelling, but throat clearing more and noticeable difference after treatment in ability to take deep breath and realized airway may have been impacted some -Given pepcid, prednisone and benadryl from ED. Done with prednisone now and is doing well -Will refer for allergy testing as this was first time she had reaction to food, but also had mosquito bite near same time frame. 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 She states ortho was worried about thoracic outlet syndrome and asked her to follow up the next day. Told if any arm swelling to go to ED but this did not happen, then allergic reaction occurred the following weekend.  Current Medication: Outpatient Encounter Medications as of 03/01/2023  Medication Sig   Accu-Chek FastClix Lancets MISC Use as directed dx e11.65   ACCU-CHEK GUIDE test strip TEST BLOOD SUGAR EVERY DAY AS DIRECTED   atorvastatin (LIPITOR) 40 MG tablet TAKE 1 TABLET(40 MG) BY MOUTH DAILY   bisoprolol-hydrochlorothiazide (ZIAC) 2.5-6.25 MG tablet Take 1 tablet by mouth daily.   doxycycline (VIBRA-TABS) 100 MG  tablet Take 1 tablet (100 mg total) by mouth 2 (two) times daily.   EPINEPHrine 0.3 mg/0.3 mL IJ SOAJ injection Inject 0.3 mg into the muscle as needed for anaphylaxis.   fluticasone (FLONASE) 50 MCG/ACT nasal spray Place 2 sprays into both nostrils daily.   hydrochlorothiazide (MICROZIDE) 12.5 MG capsule TAKE 1 CAPSULE(12.5 MG) BY MOUTH DAILY   hydrOXYzine (ATARAX) 25 MG tablet Take 1 tablet (25 mg total) by mouth 3 (three) times daily as needed for up to 5 days for itching.   Multiple Vitamins-Minerals (EMERGEN-C VITAMIN D/CALCIUM PO) Take by mouth.   famotidine (PEPCID) 20 MG tablet Take 1 tablet (20 mg total) by mouth daily for 3 days.   No facility-administered encounter medications on file as of 03/01/2023.    Surgical History: Past Surgical History:  Procedure Laterality Date   BREAST CYST ASPIRATION Right 2014   neg   BREAST EXCISIONAL BIOPSY Right 1983   neg   COLONOSCOPY WITH PROPOFOL N/A 08/05/2021   Procedure: COLONOSCOPY WITH PROPOFOL;  Surgeon: Toney Reil, MD;  Location: [facility] ENDOSCOPY;  Service: Gastroenterology;  Laterality: N/A;   TUBAL LIGATION      Medical History: Past Medical History:  Diagnosis Date   Diabetes mellitus without complication (HCC)    Hypercholesteremia    Hypertension     Family History: Family History  Problem Relation Age of Onset   Hypertension Mother    Hyperlipidemia Mother    Diabetes Mother    Diabetes Maternal Grandmother  Breast cancer Neg Hx     Social History   Socioeconomic History   Marital status: Married    Spouse name: Not on file   Number of children: Not on file   Years of education: Not on file   Highest education level: Not on file  Occupational History   Not on file  Tobacco Use   Smoking status: Never   Smokeless tobacco: Never  Vaping Use   Vaping status: Never Used  Substance and Sexual Activity   Alcohol use: Yes    Comment: occasional   Drug use: Never   Sexual activity: Not on file   Other Topics Concern   Not on file  Social History Narrative   Not on file   Social Determinants of Health   Financial Resource Strain: Not on file  Food Insecurity: Not on file  Transportation Needs: Not on file  Physical Activity: Not on file  Stress: Not on file  Social Connections: Not on file  Intimate Partner Violence: Not on file      Review of Systems  Constitutional:  Negative for chills and unexpected weight change.  HENT:  Negative for congestion, postnasal drip, rhinorrhea, sneezing and sore throat.   Eyes:  Negative for redness.  Respiratory:  Negative for cough, chest tightness and shortness of breath.   Cardiovascular:  Negative for chest pain and palpitations.  Gastrointestinal:  Negative for abdominal pain, blood in stool, constipation, diarrhea, nausea and vomiting.  Genitourinary:  Negative for dysuria and frequency.  Musculoskeletal:  Negative for back pain, joint swelling and neck pain.  Skin:  Negative for rash.  Neurological: Negative.  Negative for tremors and numbness.  Hematological:  Negative for adenopathy. Does not bruise/bleed easily.  Psychiatric/Behavioral:  Negative for behavioral problems (Depression), sleep disturbance and suicidal ideas. The patient is not nervous/anxious.     Vital Signs: BP 128/80 Comment: 130/93  Pulse 80   Temp 97.8 F (36.6 C)   Resp 16   Ht 5\' 6"  (1.676 m)   Wt 234 lb 6.4 oz (106.3 kg)   SpO2 97%   BMI 37.83 kg/m    Physical Exam Vitals and nursing note reviewed.  Constitutional:      General: She is not in acute distress.    Appearance: She is well-developed. She is obese. She is not diaphoretic.  HENT:     Head: Normocephalic and atraumatic.     Mouth/Throat:     Pharynx: No oropharyngeal exudate.  Eyes:     Pupils: Pupils are equal, round, and reactive to light.  Neck:     Thyroid: No thyromegaly.     Vascular: No JVD.     Trachea: No tracheal deviation.  Cardiovascular:     Rate and Rhythm:  Normal rate and regular rhythm.     Heart sounds: Normal heart sounds. No murmur heard.    No friction rub. No gallop.  Pulmonary:     Effort: Pulmonary effort is normal. No respiratory distress.     Breath sounds: No wheezing or rales.  Chest:     Chest wall: No tenderness.  Abdominal:     General: Bowel sounds are normal.     Palpations: Abdomen is soft.  Musculoskeletal:        General: Normal range of motion.     Cervical back: Normal range of motion and neck supple.  Lymphadenopathy:     Cervical: No cervical adenopathy.  Skin:    General: Skin is warm and dry.  Neurological:     Mental Status: She is alert and oriented to person, place, and time.     Cranial Nerves: No cranial nerve deficit.     Sensory: No sensory deficit.  Psychiatric:        Behavior: Behavior normal.        Thought Content: Thought content normal.        Judgment: Judgment normal.        Assessment/Plan: 1. Allergic reaction, initial encounter Will refer for allergy testing and she will avoid shellfish in the meantime as shrimp was possible trigger of reaction. Given epi-pen just in case - EPINEPHrine 0.3 mg/0.3 mL IJ SOAJ injection; Inject 0.3 mg into the muscle as needed for anaphylaxis.  Dispense: 1 each; Refill: 0 - Ambulatory referral to Allergy   General Counseling: afrika chop understanding of the findings of todays visit and agrees with plan of treatment. I have discussed any further diagnostic evaluation that may be needed or ordered today. We also reviewed her medications today. she has been encouraged to call the office with any questions or concerns that should arise related to todays visit.    Orders Placed This Encounter  Procedures   Ambulatory referral to Allergy    Meds ordered this encounter  Medications   EPINEPHrine 0.3 mg/0.3 mL IJ SOAJ injection    Sig: Inject 0.3 mg into the muscle as needed for anaphylaxis.    Dispense:  1 each    Refill:  0    This  patient was seen by Lynn Ito, PA-C in collaboration with Dr. Beverely Risen as a part of collaborative care agreement.   Total time spent:30 Minutes Time spent includes review of chart, medications, test results, and follow up plan with the patient.      Dr Lyndon Code Internal medicine

## 2023-03-01 NOTE — Telephone Encounter (Signed)
Allergy referral faxed to [facility]; [phone]. Notified patient. Gave pt telephone # [phone]

## 2023-04-26 ENCOUNTER — Ambulatory Visit (INDEPENDENT_AMBULATORY_CARE_PROVIDER_SITE_OTHER): Payer: BC Managed Care – PPO | Admitting: Physician Assistant

## 2023-04-26 ENCOUNTER — Encounter: Payer: Self-pay | Admitting: Physician Assistant

## 2023-04-26 VITALS — BP 115/80 | HR 80 | Temp 98.3°F | Resp 16 | Ht 66.0 in | Wt 223.6 lb

## 2023-04-26 DIAGNOSIS — E559 Vitamin D deficiency, unspecified: Secondary | ICD-10-CM

## 2023-04-26 DIAGNOSIS — E119 Type 2 diabetes mellitus without complications: Secondary | ICD-10-CM | POA: Diagnosis not present

## 2023-04-26 DIAGNOSIS — E782 Mixed hyperlipidemia: Secondary | ICD-10-CM | POA: Diagnosis not present

## 2023-04-26 DIAGNOSIS — Z124 Encounter for screening for malignant neoplasm of cervix: Secondary | ICD-10-CM

## 2023-04-26 DIAGNOSIS — Z113 Encounter for screening for infections with a predominantly sexual mode of transmission: Secondary | ICD-10-CM

## 2023-04-26 DIAGNOSIS — Z1329 Encounter for screening for other suspected endocrine disorder: Secondary | ICD-10-CM

## 2023-04-26 DIAGNOSIS — K625 Hemorrhage of anus and rectum: Secondary | ICD-10-CM | POA: Diagnosis not present

## 2023-04-26 DIAGNOSIS — R5383 Other fatigue: Secondary | ICD-10-CM

## 2023-04-26 DIAGNOSIS — E538 Deficiency of other specified B group vitamins: Secondary | ICD-10-CM

## 2023-04-26 NOTE — Progress Notes (Signed)
[facility] [address] [place], [place] [address]  Internal MEDICINE  Office Visit Note  Patient Name: Lindsey Cruz  [id number]  [id number]  Date of Service: 04/26/2023  Chief Complaint  Patient presents with   Follow-up   Diabetes   Hypertension   Hyperlipidemia   Blood In Stools    Last seen on Saturday, blood on toilet paper. Blood seen on the stool a couple days prior.    HPI Pt is here for routine follow up -Down 11lbs since last visit -Recently had a cold, has been drinking a lot of water due to cold medication making her thirsty and thinks this contributed to weight loss. -A little blood on TP on Saturday and a little in the bowl on Friday. No recurrence now. No pain or constipation. Will monitor. No dark/tarry stools or blood in stool itself seen. -pap done -due for labs before CPE, if bleeding reoccurs or if any symptoms arise then may check labs early to monitor H&H  Current Medication: Outpatient Encounter Medications as of 04/26/2023  Medication Sig   Accu-Chek FastClix Lancets MISC Use as directed dx e11.65   ACCU-CHEK GUIDE test strip TEST BLOOD SUGAR EVERY DAY AS DIRECTED   atorvastatin (LIPITOR) 40 MG tablet TAKE 1 TABLET(40 MG) BY MOUTH DAILY   bisoprolol-hydrochlorothiazide (ZIAC) 2.5-6.25 MG tablet Take 1 tablet by mouth daily.   doxycycline (VIBRA-TABS) 100 MG tablet Take 1 tablet (100 mg total) by mouth 2 (two) times daily.   EPINEPHrine 0.3 mg/0.3 mL IJ SOAJ injection Inject 0.3 mg into the muscle as needed for anaphylaxis.   fluticasone (FLONASE) 50 MCG/ACT nasal spray Place 2 sprays into both nostrils daily.   hydrochlorothiazide (MICROZIDE) 12.5 MG capsule TAKE 1 CAPSULE(12.5 MG) BY MOUTH DAILY   Multiple Vitamins-Minerals (EMERGEN-C VITAMIN D/CALCIUM PO) Take by mouth.   famotidine (PEPCID) 20 MG tablet Take 1 tablet (20 mg total) by mouth daily for 3 days.   No facility-administered encounter medications on file as of 04/26/2023.     Surgical History: Past Surgical History:  Procedure Laterality Date   BREAST CYST ASPIRATION Right 2014   neg   BREAST EXCISIONAL BIOPSY Right 1983   neg   COLONOSCOPY WITH PROPOFOL N/A 08/05/2021   Procedure: COLONOSCOPY WITH PROPOFOL;  Surgeon: Toney Reil, MD;  Location: [facility] ENDOSCOPY;  Service: Gastroenterology;  Laterality: N/A;   TUBAL LIGATION      Medical History: Past Medical History:  Diagnosis Date   Diabetes mellitus without complication (HCC)    Hypercholesteremia    Hypertension     Family History: Family History  Problem Relation Age of Onset   Hypertension Mother    Hyperlipidemia Mother    Diabetes Mother    Diabetes Maternal Grandmother    Breast cancer Neg Hx     Social History   Socioeconomic History   Marital status: Married    Spouse name: Not on file   Number of children: Not on file   Years of education: Not on file   Highest education level: Not on file  Occupational History   Not on file  Tobacco Use   Smoking status: Never   Smokeless tobacco: Never  Vaping Use   Vaping status: Never Used  Substance and Sexual Activity   Alcohol use: Yes    Comment: occasional   Drug use: Never   Sexual activity: Not on file  Other Topics Concern   Not on file  Social History Narrative   Not on  file   Social Determinants of Health   Financial Resource Strain: Not on file  Food Insecurity: Not on file  Transportation Needs: Not on file  Physical Activity: Not on file  Stress: Not on file  Social Connections: Not on file  Intimate Partner Violence: Not on file      Review of Systems  Constitutional:  Negative for chills and unexpected weight change.  HENT:  Negative for congestion, postnasal drip, rhinorrhea, sneezing and sore throat.   Eyes:  Negative for redness.  Respiratory:  Negative for cough, chest tightness and shortness of breath.   Cardiovascular:  Negative for chest pain and palpitations.  Gastrointestinal:   Positive for anal bleeding. Negative for abdominal pain, blood in stool, constipation, diarrhea, nausea, rectal pain and vomiting.  Genitourinary:  Negative for dysuria and frequency.  Musculoskeletal:  Negative for back pain, joint swelling and neck pain.  Skin:  Negative for rash.  Neurological: Negative.  Negative for tremors and numbness.  Hematological:  Negative for adenopathy. Does not bruise/bleed easily.  Psychiatric/Behavioral:  Negative for behavioral problems (Depression), sleep disturbance and suicidal ideas. The patient is not nervous/anxious.     Vital Signs: BP 115/80   Pulse 80   Temp 98.3 F (36.8 C)   Resp 16   Ht 5\' 6"  (1.676 m)   Wt 223 lb 9.6 oz (101.4 kg)   SpO2 97%   BMI 36.09 kg/m    Physical Exam Vitals and nursing note reviewed. Exam conducted with a chaperone present.  Constitutional:      General: She is not in acute distress.    Appearance: She is well-developed. She is obese. She is not diaphoretic.  HENT:     Head: Normocephalic and atraumatic.     Mouth/Throat:     Pharynx: No oropharyngeal exudate.  Eyes:     Pupils: Pupils are equal, round, and reactive to light.  Neck:     Thyroid: No thyromegaly.     Vascular: No JVD.     Trachea: No tracheal deviation.  Cardiovascular:     Rate and Rhythm: Normal rate and regular rhythm.     Heart sounds: Normal heart sounds. No murmur heard.    No friction rub. No gallop.  Pulmonary:     Effort: Pulmonary effort is normal. No respiratory distress.     Breath sounds: No wheezing or rales.  Chest:     Chest wall: No tenderness.  Abdominal:     General: Bowel sounds are normal.     Palpations: Abdomen is soft.  Genitourinary:    Exam position: Lithotomy position.     Cervix: No friability or lesion.     Comments: Pap performed Musculoskeletal:        General: Normal range of motion.     Cervical back: Normal range of motion and neck supple.  Lymphadenopathy:     Cervical: No cervical  adenopathy.  Skin:    General: Skin is warm and dry.  Neurological:     Mental Status: She is alert and oriented to person, place, and time.     Cranial Nerves: No cranial nerve deficit.     Sensory: No sensory deficit.  Psychiatric:        Behavior: Behavior normal.        Thought Content: Thought content normal.        Judgment: Judgment normal.        Assessment/Plan: 1. Routine cervical smear - IGP, Aptima HPV  2. Screening for  STDs (sexually transmitted diseases) - NuSwab Vaginitis Plus (VG+)  3. Bright red blood per rectum 2 occurrences last weekend of small amount of blood in bowl vs TP, none in stool. Will monitor for any recurrence or symptoms. May need to check labs or consider GI referral  4. Type 2 diabetes mellitus without complication, without long-term current use of insulin (HCC) - Hgb A1C w/o eAG  5. Mixed hyperlipidemia - Lipid Panel With LDL/HDL Ratio  6. Thyroid disorder screen - TSH + free T4  7. B12 deficiency - B12 and Folate Panel  8. Vitamin D deficiency - VITAMIN D 25 Hydroxy (Vit-D Deficiency, Fractures)  9. Other fatigue - CBC w/Diff/Platelet - Comprehensive metabolic panel - TSH + free T4 - Lipid Panel With LDL/HDL Ratio - Fe+TIBC+Fer - VITAMIN D 25 Hydroxy (Vit-D Deficiency, Fractures) - B12 and Folate Panel - Hgb A1C w/o eAG   General Counseling: Erene verbalizes understanding of the findings of todays visit and agrees with plan of treatment. I have discussed any further diagnostic evaluation that may be needed or ordered today. We also reviewed her medications today. she has been encouraged to call the office with any questions or concerns that should arise related to todays visit.    Orders Placed This Encounter  Procedures   NuSwab Vaginitis Plus (VG+)   CBC w/Diff/Platelet   Comprehensive metabolic panel   TSH + free T4   Lipid Panel With LDL/HDL Ratio   Fe+TIBC+Fer   VITAMIN D 25 Hydroxy (Vit-D Deficiency, Fractures)    B12 and Folate Panel   Hgb A1C w/o eAG    No orders of the defined types were placed in this encounter.   This patient was seen by Lynn Ito, PA-C in collaboration with Dr. Beverely Risen as a part of collaborative care agreement.   Total time spent:30 Minutes Time spent includes review of chart, medications, test results, and follow up plan with the patient.      Dr Lyndon Code Internal medicine

## 2023-04-29 LAB — NUSWAB VAGINITIS PLUS (VG+)
Candida albicans, NAA: NEGATIVE
Candida glabrata, NAA: NEGATIVE
Chlamydia trachomatis, NAA: NEGATIVE
Neisseria gonorrhoeae, NAA: NEGATIVE
Trich vag by NAA: NEGATIVE

## 2023-05-02 LAB — IGP, APTIMA HPV: HPV Aptima: NEGATIVE

## 2023-05-03 ENCOUNTER — Telehealth: Payer: Self-pay

## 2023-05-03 NOTE — Telephone Encounter (Signed)
-----   Message from Lindsey Cruz sent at 05/02/2023  2:26 PM EST ----- Please let her know that her pap was normal and she is negative for HPV

## 2023-05-03 NOTE — Telephone Encounter (Signed)
Spoke with patient regarding pap results.

## 2023-05-22 DIAGNOSIS — Z91013 Allergy to seafood: Secondary | ICD-10-CM | POA: Diagnosis not present

## 2023-05-22 DIAGNOSIS — J300 Vasomotor rhinitis: Secondary | ICD-10-CM | POA: Diagnosis not present

## 2023-06-14 DIAGNOSIS — Z91013 Allergy to seafood: Secondary | ICD-10-CM | POA: Diagnosis not present

## 2023-06-21 ENCOUNTER — Other Ambulatory Visit: Payer: Self-pay | Admitting: Physician Assistant

## 2023-06-22 ENCOUNTER — Other Ambulatory Visit: Payer: Self-pay

## 2023-06-22 DIAGNOSIS — I10 Essential (primary) hypertension: Secondary | ICD-10-CM

## 2023-06-22 MED ORDER — BISOPROLOL-HYDROCHLOROTHIAZIDE 2.5-6.25 MG PO TABS: 1.0000 | ORAL_TABLET | Freq: Every day | ORAL | 1 refills | Status: DC

## 2023-06-28 DIAGNOSIS — F4322 Adjustment disorder with anxiety: Secondary | ICD-10-CM | POA: Diagnosis not present

## 2023-07-16 ENCOUNTER — Encounter: Payer: BC Managed Care – PPO | Admitting: Physician Assistant

## 2023-07-20 ENCOUNTER — Encounter: Payer: Self-pay | Admitting: Physician Assistant

## 2023-07-20 ENCOUNTER — Ambulatory Visit (INDEPENDENT_AMBULATORY_CARE_PROVIDER_SITE_OTHER): Payer: BC Managed Care – PPO | Admitting: Physician Assistant

## 2023-07-20 VITALS — BP 124/78 | HR 74 | Temp 98.1°F | Resp 16 | Ht 66.0 in | Wt 238.2 lb

## 2023-07-20 DIAGNOSIS — E119 Type 2 diabetes mellitus without complications: Secondary | ICD-10-CM | POA: Diagnosis not present

## 2023-07-20 DIAGNOSIS — R52 Pain, unspecified: Secondary | ICD-10-CM

## 2023-07-20 LAB — POCT GLYCOSYLATED HEMOGLOBIN (HGB A1C): Hemoglobin A1C: 6.9 % — AB (ref 4.0–5.6)

## 2023-07-20 LAB — POCT INFLUENZA A/B
Influenza A, POC: NEGATIVE
Influenza B, POC: NEGATIVE

## 2023-07-20 NOTE — Progress Notes (Signed)
 [facility] [address] [place], [place] [address]  Internal MEDICINE  Office Visit Note  Patient Name: Lindsey Cruz  [id number]  [id number]  Date of Service: 08/01/2023  Chief Complaint  Patient presents with   Diabetes   Hypertension   Hyperlipidemia   Annual Exam     HPI Pt is here for a sick visit.  -She had initially been scheduled for CPE, but this will be rescheduled due to not feeling well -Started not feeling well past few days, shoulder painful at first on Wed, but then yesterday developed headaches and it continued.  -Took an emergency, and tried to work from home and just felt worse. -Body aches all over -Appetite ok -A little post nasal drip and throat clearing -No N/V/D, no fever or chills -did take coridicin HBP nighttime last night -drinking plenty of fluids -no sick contacts -due for labs and will get this done before rescheduled CPE, A1c already done in office prior to change to sick visit  Current Medication:  Outpatient Encounter Medications as of 07/20/2023  Medication Sig   Accu-Chek FastClix Lancets MISC Use as directed dx e11.65   ACCU-CHEK GUIDE test strip TEST BLOOD SUGAR EVERY DAY AS DIRECTED   atorvastatin  (LIPITOR) 40 MG tablet TAKE 1 TABLET(40 MG) BY MOUTH DAILY   bisoprolol -hydrochlorothiazide  (ZIAC ) 2.5-6.25 MG tablet Take 1 tablet by mouth daily.   doxycycline  (VIBRA -TABS) 100 MG tablet Take 1 tablet (100 mg total) by mouth 2 (two) times daily.   EPINEPHrine  0.3 mg/0.3 mL IJ SOAJ injection Inject 0.3 mg into the muscle as needed for anaphylaxis.   fluticasone  (FLONASE ) 50 MCG/ACT nasal spray Place 2 sprays into both nostrils daily.   hydrochlorothiazide  (MICROZIDE ) 12.5 MG capsule TAKE 1 CAPSULE(12.5 MG) BY MOUTH DAILY   Multiple Vitamins-Minerals (EMERGEN-C VITAMIN D /CALCIUM  PO) Take by mouth.   famotidine  (PEPCID ) 20 MG tablet Take 1 tablet (20 mg total) by mouth daily for 3 days.   No facility-administered encounter  medications on file as of 07/20/2023.      Medical History: Past Medical History:  Diagnosis Date   Diabetes mellitus without complication (HCC)    Hypercholesteremia    Hypertension      Vital Signs: BP 124/78   Pulse 74   Temp 98.1 F (36.7 C)   Resp 16   Ht 5' 6 (1.676 m)   Wt 238 lb 3.2 oz (108 kg)   SpO2 97%   BMI 38.45 kg/m    Review of Systems  Constitutional:  Positive for fatigue. Negative for fever.  HENT:  Positive for postnasal drip. Negative for congestion and mouth sores.   Respiratory:  Negative for cough.   Cardiovascular:  Negative for chest pain.  Gastrointestinal:  Negative for diarrhea, nausea and vomiting.  Genitourinary:  Negative for flank pain.  Musculoskeletal:  Positive for arthralgias and myalgias.  Neurological:  Positive for headaches.  Psychiatric/Behavioral: Negative.      Physical Exam Vitals and nursing note reviewed.  Constitutional:      Appearance: She is ill-appearing.  HENT:     Head: Normocephalic and atraumatic.  Cardiovascular:     Rate and Rhythm: Normal rate and regular rhythm.  Pulmonary:     Effort: Pulmonary effort is normal.     Breath sounds: Normal breath sounds.  Skin:    General: Skin is warm and dry.  Neurological:     Mental Status: She is alert.  Psychiatric:        Mood and Affect:  Mood normal.        Behavior: Behavior normal.       Assessment/Plan: 1. Body aches (Primary) Likely viral infection, though flu test negative in office. Will take covid test as well. Advised to rest and stay hydrated. May use tylenol as needed - POCT Influenza A/B  2. Type 2 diabetes mellitus without complication, without long-term current use of insulin (HCC) - POCT glycosylated hemoglobin (Hb A1C) is 6.9 which is up from 6.6 last visit. Will work on diet and exercise   General Counseling: amelie caracci understanding of the findings of todays visit and agrees with plan of treatment. I have discussed any further  diagnostic evaluation that may be needed or ordered today. We also reviewed her medications today. she has been encouraged to call the office with any questions or concerns that should arise related to todays visit.    Counseling:    Orders Placed This Encounter  Procedures   POCT glycosylated hemoglobin (Hb A1C)   POCT Influenza A/B    No orders of the defined types were placed in this encounter.   Time spent:30 Minutes

## 2023-08-03 DIAGNOSIS — R5383 Other fatigue: Secondary | ICD-10-CM | POA: Diagnosis not present

## 2023-08-03 DIAGNOSIS — E782 Mixed hyperlipidemia: Secondary | ICD-10-CM | POA: Diagnosis not present

## 2023-08-03 DIAGNOSIS — E559 Vitamin D deficiency, unspecified: Secondary | ICD-10-CM | POA: Diagnosis not present

## 2023-08-03 DIAGNOSIS — E538 Deficiency of other specified B group vitamins: Secondary | ICD-10-CM | POA: Diagnosis not present

## 2023-08-03 DIAGNOSIS — E119 Type 2 diabetes mellitus without complications: Secondary | ICD-10-CM | POA: Diagnosis not present

## 2023-08-03 DIAGNOSIS — Z1329 Encounter for screening for other suspected endocrine disorder: Secondary | ICD-10-CM | POA: Diagnosis not present

## 2023-08-04 LAB — CBC WITH DIFFERENTIAL/PLATELET
Basophils Absolute: 0 10*3/uL (ref 0.0–0.2)
Basos: 0 %
EOS (ABSOLUTE): 0.1 10*3/uL (ref 0.0–0.4)
Eos: 1 %
Hematocrit: 40.8 % (ref 34.0–46.6)
Hemoglobin: 12.6 g/dL (ref 11.1–15.9)
Immature Grans (Abs): 0 10*3/uL (ref 0.0–0.1)
Immature Granulocytes: 0 %
Lymphocytes Absolute: 2.4 10*3/uL (ref 0.7–3.1)
Lymphs: 42 %
MCH: 29.1 pg (ref 26.6–33.0)
MCHC: 30.9 g/dL — ABNORMAL LOW (ref 31.5–35.7)
MCV: 94 fL (ref 79–97)
Monocytes Absolute: 0.7 10*3/uL (ref 0.1–0.9)
Monocytes: 11 %
Neutrophils Absolute: 2.7 10*3/uL (ref 1.4–7.0)
Neutrophils: 46 %
Platelets: 229 10*3/uL (ref 150–450)
RBC: 4.33 x10E6/uL (ref 3.77–5.28)
RDW: 12.5 % (ref 11.7–15.4)
WBC: 5.8 10*3/uL (ref 3.4–10.8)

## 2023-08-04 LAB — HGB A1C W/O EAG: Hgb A1c MFr Bld: 7.2 % — ABNORMAL HIGH (ref 4.8–5.6)

## 2023-08-04 LAB — VITAMIN D 25 HYDROXY (VIT D DEFICIENCY, FRACTURES): Vit D, 25-Hydroxy: 17 ng/mL — ABNORMAL LOW (ref 30.0–100.0)

## 2023-08-04 LAB — COMPREHENSIVE METABOLIC PANEL
ALT: 19 [IU]/L (ref 0–32)
AST: 16 [IU]/L (ref 0–40)
Albumin: 4.2 g/dL (ref 3.8–4.9)
Alkaline Phosphatase: 72 [IU]/L (ref 44–121)
BUN/Creatinine Ratio: 18 (ref 9–23)
BUN: 16 mg/dL (ref 6–24)
Bilirubin Total: 0.3 mg/dL (ref 0.0–1.2)
CO2: 26 mmol/L (ref 20–29)
Calcium: 9.6 mg/dL (ref 8.7–10.2)
Chloride: 101 mmol/L (ref 96–106)
Creatinine, Ser: 0.91 mg/dL (ref 0.57–1.00)
Globulin, Total: 2.9 g/dL (ref 1.5–4.5)
Glucose: 141 mg/dL — ABNORMAL HIGH (ref 70–99)
Potassium: 4.6 mmol/L (ref 3.5–5.2)
Sodium: 140 mmol/L (ref 134–144)
Total Protein: 7.1 g/dL (ref 6.0–8.5)
eGFR: 74 mL/min/{1.73_m2} (ref >59)

## 2023-08-04 LAB — B12 AND FOLATE PANEL
Folate: 16 ng/mL (ref >3.0)
Vitamin B-12: 532 pg/mL (ref 232–1245)

## 2023-08-04 LAB — IRON,TIBC AND FERRITIN PANEL
Ferritin: 285 ng/mL — ABNORMAL HIGH (ref 15–150)
Iron Saturation: 23 % (ref 15–55)
Iron: 74 ug/dL (ref 27–159)
Total Iron Binding Capacity: 317 ug/dL (ref 250–450)
UIBC: 243 ug/dL (ref 131–425)

## 2023-08-04 LAB — LIPID PANEL WITH LDL/HDL RATIO
Cholesterol, Total: 174 mg/dL (ref 100–199)
HDL: 38 mg/dL — ABNORMAL LOW (ref >39)
LDL Chol Calc (NIH): 108 mg/dL — ABNORMAL HIGH (ref 0–99)
LDL/HDL Ratio: 2.8 {ratio} (ref 0.0–3.2)
Triglycerides: 157 mg/dL — ABNORMAL HIGH (ref 0–149)
VLDL Cholesterol Cal: 28 mg/dL (ref 5–40)

## 2023-08-04 LAB — TSH+FREE T4
Free T4: 1.24 ng/dL (ref 0.82–1.77)
TSH: 1.43 u[IU]/mL (ref 0.450–4.500)

## 2023-08-09 ENCOUNTER — Ambulatory Visit (INDEPENDENT_AMBULATORY_CARE_PROVIDER_SITE_OTHER): Payer: BC Managed Care – PPO | Admitting: Physician Assistant

## 2023-08-09 ENCOUNTER — Encounter: Payer: Self-pay | Admitting: Physician Assistant

## 2023-08-09 VITALS — BP 112/80 | HR 66 | Temp 98.4°F | Resp 16 | Ht 66.0 in | Wt 240.2 lb

## 2023-08-09 DIAGNOSIS — E559 Vitamin D deficiency, unspecified: Secondary | ICD-10-CM

## 2023-08-09 DIAGNOSIS — F439 Reaction to severe stress, unspecified: Secondary | ICD-10-CM | POA: Diagnosis not present

## 2023-08-09 DIAGNOSIS — Z1231 Encounter for screening mammogram for malignant neoplasm of breast: Secondary | ICD-10-CM | POA: Diagnosis not present

## 2023-08-09 DIAGNOSIS — F419 Anxiety disorder, unspecified: Secondary | ICD-10-CM | POA: Diagnosis not present

## 2023-08-09 DIAGNOSIS — Z0001 Encounter for general adult medical examination with abnormal findings: Secondary | ICD-10-CM

## 2023-08-09 DIAGNOSIS — E119 Type 2 diabetes mellitus without complications: Secondary | ICD-10-CM | POA: Diagnosis not present

## 2023-08-09 DIAGNOSIS — Z818 Family history of other mental and behavioral disorders: Secondary | ICD-10-CM

## 2023-08-09 DIAGNOSIS — E782 Mixed hyperlipidemia: Secondary | ICD-10-CM

## 2023-08-09 MED ORDER — ERGOCALCIFEROL 1.25 MG (50000 UT) PO CAPS
ORAL_CAPSULE | ORAL | 3 refills | Status: DC
Start: 2023-08-09 — End: 2024-03-27

## 2023-08-09 NOTE — Progress Notes (Signed)
 [facility] [address] [place], [place] [address]  Internal MEDICINE  Office Visit Note  Patient Name: Lindsey Cruz  [id number]  [id number]  Date of Service: 08/16/2023  Chief Complaint  Patient presents with   Annual Exam    Review labs   Diabetes   Hypertension   Hyperlipidemia     HPI Pt is here for routine health maintenance examination -Feeling better now -labs reviewed: LDL up some, ferrtin a little high--monitor, Vit D--will start drisdol, A1c elevated at 7.2--will work on this -BG 130 recently -eye exam coming up soon -colonoscopy UTD, due in 2033 -needs to call with dates for shingles vaccines done last year -does mention left hip bothersome when Lindsey Cruz hits around 2200 steps, but then improves after -Fhx of dementia, but states this may have been brought on by depression. No symptoms of any memory concerns personally. Thinks more stress and being very busy. Interested in seeing a therapist and will place referral now  Current Medication: Outpatient Encounter Medications as of 08/09/2023  Medication Sig   Accu-Chek FastClix Lancets MISC Use as directed dx e11.65   ACCU-CHEK GUIDE test strip TEST BLOOD SUGAR EVERY DAY AS DIRECTED   atorvastatin (LIPITOR) 40 MG tablet TAKE 1 TABLET(40 MG) BY MOUTH DAILY   bisoprolol-hydrochlorothiazide (ZIAC) 2.5-6.25 MG tablet Take 1 tablet by mouth daily.   doxycycline (VIBRA-TABS) 100 MG tablet Take 1 tablet (100 mg total) by mouth 2 (two) times daily.   EPINEPHrine 0.3 mg/0.3 mL IJ SOAJ injection Inject 0.3 mg into the muscle as needed for anaphylaxis.   ergocalciferol (DRISDOL) 1.25 MG (50000 UT) capsule Take one cap q week   fluticasone (FLONASE) 50 MCG/ACT nasal spray Place 2 sprays into both nostrils daily.   hydrochlorothiazide (MICROZIDE) 12.5 MG capsule TAKE 1 CAPSULE(12.5 MG) BY MOUTH DAILY   Multiple Vitamins-Minerals (EMERGEN-C VITAMIN D/CALCIUM PO) Take by mouth.   famotidine (PEPCID) 20 MG tablet Take 1  tablet (20 mg total) by mouth daily for 3 days.   No facility-administered encounter medications on file as of 08/09/2023.    Surgical History: Past Surgical History:  Procedure Laterality Date   BREAST CYST ASPIRATION Right 2014   neg   BREAST EXCISIONAL BIOPSY Right 1983   neg   COLONOSCOPY WITH PROPOFOL N/A 08/05/2021   Procedure: COLONOSCOPY WITH PROPOFOL;  Surgeon: Toney Reil, MD;  Location: [facility] ENDOSCOPY;  Service: Gastroenterology;  Laterality: N/A;   TUBAL LIGATION      Medical History: Past Medical History:  Diagnosis Date   Diabetes mellitus without complication (HCC)    Hypercholesteremia    Hypertension     Family History: Family History  Problem Relation Age of Onset   Hypertension Mother    Hyperlipidemia Mother    Diabetes Mother    Diabetes Maternal Grandmother    Breast cancer Neg Hx       Review of Systems  Constitutional:  Negative for chills and unexpected weight change.  HENT:  Negative for congestion, postnasal drip, rhinorrhea, sneezing and sore throat.   Eyes:  Negative for redness.  Respiratory:  Negative for cough, chest tightness and shortness of breath.   Cardiovascular:  Negative for chest pain and palpitations.  Gastrointestinal:  Negative for abdominal pain, blood in stool, constipation, diarrhea, nausea and vomiting.  Genitourinary:  Negative for dysuria and frequency.  Musculoskeletal:  Positive for arthralgias. Negative for back pain, joint swelling and neck pain.  Skin:  Negative for rash.  Neurological: Negative.  Negative for  tremors and numbness.  Hematological:  Negative for adenopathy. Does not bruise/bleed easily.  Psychiatric/Behavioral:  Negative for behavioral problems (Depression), sleep disturbance and suicidal ideas. The patient is nervous/anxious.      Vital Signs: BP 112/80   Pulse 66   Temp 98.4 F (36.9 C)   Resp 16   Ht 5\' 6"  (1.676 m)   Wt 240 lb 3.2 oz (109 kg)   SpO2 96%   BMI 38.77 kg/m     Physical Exam Vitals and nursing note reviewed.  Constitutional:      General: Lindsey Cruz is not in acute distress.    Appearance: Lindsey Cruz is well-developed. Lindsey Cruz is obese. Lindsey Cruz is not diaphoretic.  HENT:     Head: Normocephalic and atraumatic.     Mouth/Throat:     Pharynx: No oropharyngeal exudate.  Eyes:     Pupils: Pupils are equal, round, and reactive to light.  Neck:     Thyroid: No thyromegaly.     Vascular: No JVD.     Trachea: No tracheal deviation.  Cardiovascular:     Rate and Rhythm: Normal rate and regular rhythm.     Heart sounds: Normal heart sounds. No murmur heard.    No friction rub. No gallop.  Pulmonary:     Effort: Pulmonary effort is normal. No respiratory distress.     Breath sounds: No wheezing or rales.  Chest:     Chest wall: No tenderness.  Abdominal:     General: Bowel sounds are normal.     Palpations: Abdomen is soft.     Tenderness: There is no abdominal tenderness.  Musculoskeletal:        General: Normal range of motion.     Cervical back: Normal range of motion and neck supple.  Lymphadenopathy:     Cervical: No cervical adenopathy.  Skin:    General: Skin is warm and dry.  Neurological:     Mental Status: Lindsey Cruz is alert and oriented to person, place, and time.     Cranial Nerves: No cranial nerve deficit.     Sensory: No sensory deficit.  Psychiatric:        Behavior: Behavior normal.        Thought Content: Thought content normal.        Judgment: Judgment normal.      LABS: Recent Results (from the past 2160 hours)  POCT glycosylated hemoglobin (Hb A1C)     Status: Abnormal   Collection Time: 07/20/23 11:42 AM  Result Value Ref Range   Hemoglobin A1C 6.9 (A) 4.0 - 5.6 %   HbA1c POC (<> result, manual entry)     HbA1c, POC (prediabetic range)     HbA1c, POC (controlled diabetic range)    POCT Influenza A/B     Status: None   Collection Time: 07/20/23 11:58 AM  Result Value Ref Range   Influenza A, POC Negative Negative   Influenza  B, POC Negative Negative  CBC w/Diff/Platelet     Status: Abnormal   Collection Time: 08/03/23  7:10 AM  Result Value Ref Range   WBC 5.8 3.4 - 10.8 x10E3/uL   RBC 4.33 3.77 - 5.28 x10E6/uL   Hemoglobin 12.6 11.1 - 15.9 g/dL   Hematocrit 63.8 75.6 - 46.6 %   MCV 94 79 - 97 fL   MCH 29.1 26.6 - 33.0 pg   MCHC 30.9 (L) 31.5 - 35.7 g/dL   RDW 43.3 29.5 - 18.8 %   Platelets 229 150 - 450  x10E3/uL   Neutrophils 46 Not Estab. %   Lymphs 42 Not Estab. %   Monocytes 11 Not Estab. %   Eos 1 Not Estab. %   Basos 0 Not Estab. %   Neutrophils Absolute 2.7 1.4 - 7.0 x10E3/uL   Lymphocytes Absolute 2.4 0.7 - 3.1 x10E3/uL   Monocytes Absolute 0.7 0.1 - 0.9 x10E3/uL   EOS (ABSOLUTE) 0.1 0.0 - 0.4 x10E3/uL   Basophils Absolute 0.0 0.0 - 0.2 x10E3/uL   Immature Granulocytes 0 Not Estab. %   Immature Grans (Abs) 0.0 0.0 - 0.1 x10E3/uL  Comprehensive metabolic panel     Status: Abnormal   Collection Time: 08/03/23  7:10 AM  Result Value Ref Range   Glucose 141 (H) 70 - 99 mg/dL   BUN 16 6 - 24 mg/dL   Creatinine, Ser 6.23 0.57 - 1.00 mg/dL   eGFR 74 >76 EG/BTD/1.76   BUN/Creatinine Ratio 18 9 - 23   Sodium 140 134 - 144 mmol/L   Potassium 4.6 3.5 - 5.2 mmol/L   Chloride 101 96 - 106 mmol/L   CO2 26 20 - 29 mmol/L   Calcium 9.6 8.7 - 10.2 mg/dL   Total Protein 7.1 6.0 - 8.5 g/dL   Albumin 4.2 3.8 - 4.9 g/dL   Globulin, Total 2.9 1.5 - 4.5 g/dL   Bilirubin Total 0.3 0.0 - 1.2 mg/dL   Alkaline Phosphatase 72 44 - 121 IU/L   AST 16 0 - 40 IU/L   ALT 19 0 - 32 IU/L  TSH + free T4     Status: None   Collection Time: 08/03/23  7:10 AM  Result Value Ref Range   TSH 1.430 0.450 - 4.500 uIU/mL   Free T4 1.24 0.82 - 1.77 ng/dL  Lipid Panel With LDL/HDL Ratio     Status: Abnormal   Collection Time: 08/03/23  7:10 AM  Result Value Ref Range   Cholesterol, Total 174 100 - 199 mg/dL   Triglycerides 160 (H) 0 - 149 mg/dL   HDL 38 (L) >73 mg/dL   VLDL Cholesterol Cal 28 5 - 40 mg/dL   LDL Chol  Calc (NIH) 108 (H) 0 - 99 mg/dL   LDL/HDL Ratio 2.8 0.0 - 3.2 ratio    Comment:                                     LDL/HDL Ratio                                             Men  Women                               1/2 Avg.Risk  1.0    1.5                                   Avg.Risk  3.6    3.2                                2X Avg.Risk  6.2    5.0  3X Avg.Risk  8.0    6.1   Fe+TIBC+Fer     Status: Abnormal   Collection Time: 08/03/23  7:10 AM  Result Value Ref Range   Total Iron Binding Capacity 317 250 - 450 ug/dL   UIBC 161 096 - 045 ug/dL   Iron 74 27 - 409 ug/dL   Iron Saturation 23 15 - 55 %   Ferritin 285 (H) 15 - 150 ng/mL  VITAMIN D 25 Hydroxy (Vit-D Deficiency, Fractures)     Status: Abnormal   Collection Time: 08/03/23  7:10 AM  Result Value Ref Range   Vit D, 25-Hydroxy 17.0 (L) 30.0 - 100.0 ng/mL    Comment: Vitamin D deficiency has been defined by the Institute of Medicine and an Endocrine Society practice guideline as a level of serum 25-OH vitamin D less than 20 ng/mL (1,2). The Endocrine Society went on to further define vitamin D insufficiency as a level between 21 and 29 ng/mL (2). 1. IOM (Institute of Medicine). 2010. Dietary reference    intakes for calcium and D. [place]: The    [employer]. 2. Holick MF, Binkley Tullos, Bischoff-Ferrari HA, et al.    Evaluation, treatment, and prevention of vitamin D    deficiency: an Endocrine Society clinical practice    guideline. JCEM. 2011 Jul; 96(7):1911-30.   B12 and Folate Panel     Status: None   Collection Time: 08/03/23  7:10 AM  Result Value Ref Range   Vitamin B-12 532 232 - 1,245 pg/mL   Folate 16.0 >3.0 ng/mL    Comment: A serum folate concentration of less than 3.1 ng/mL is considered to represent clinical deficiency.   Hgb A1C w/o eAG     Status: Abnormal   Collection Time: 08/03/23  7:10 AM  Result Value Ref Range   Hgb A1c MFr Bld 7.2 (H) 4.8 - 5.6 %     Comment:          Prediabetes: 5.7 - 6.4          Diabetes: >6.4          Glycemic control for adults with diabetes: <7.0   Urine Microalbumin w/creat. ratio     Status: None   Collection Time: 08/09/23  3:45 PM  Result Value Ref Range   Creatinine, Urine 47.2 Not Estab. mg/dL   Microalbumin, Urine <8.1 Not Estab. ug/mL   Microalb/Creat Ratio <6 0 - 29 mg/g creat    Comment:                        Normal:                0 -  29                        Moderately increased: 30 - 300                        Severely increased:       >300     .MAMM    Assessment/Plan: 1. Encounter for general adult medical examination with abnormal findings (Primary) CPE performed, will call with vaccine dates, has eye exam soon, otherwise UTD on PHM  2. Type 2 diabetes mellitus without complication, without long-term current use of insulin (HCC) A1c up to 7.2 now and will work on this. Will continue to work on diet and exercise. If not improving will need  medication to help lower glucose - Urine Microalbumin w/creat. ratio  3. Stress Will refer to therapist for counseling - Ambulatory referral to Psychology  4. Anxiety - Ambulatory referral to Psychology  5. Visit for screening mammogram - MM 3D SCREENING MAMMOGRAM BILATERAL BREAST; Future  6. Vitamin D deficiency - ergocalciferol (DRISDOL) 1.25 MG (50000 UT) capsule; Take one cap q week  Dispense: 12 capsule; Refill: 3  7. Mixed hyperlipidemia Continue lipitor   General Counseling: Lindsey Cruz verbalizes understanding of the findings of todays visit and agrees with plan of treatment. I have discussed any further diagnostic evaluation that may be needed or ordered today. We also reviewed her medications today. Lindsey Cruz has been encouraged to call the office with any questions or concerns that should arise related to todays visit.    Counseling:    Orders Placed This Encounter  Procedures   MM 3D SCREENING MAMMOGRAM BILATERAL BREAST   Urine  Microalbumin w/creat. ratio   Ambulatory referral to Psychology    Meds ordered this encounter  Medications   ergocalciferol (DRISDOL) 1.25 MG (50000 UT) capsule    Sig: Take one cap q week    Dispense:  12 capsule    Refill:  3    This patient was seen by Lynn Ito, PA-C in collaboration with Dr. Beverely Risen as a part of collaborative care agreement.  Total time spent:35 Minutes  Time spent includes review of chart, medications, test results, and follow up plan with the patient.     Lyndon Code, MD  Internal Medicine

## 2023-08-10 ENCOUNTER — Telehealth: Payer: Self-pay | Admitting: Physician Assistant

## 2023-08-10 NOTE — Telephone Encounter (Signed)
 Awaiting 08/09/23 office notes for psychology referral-Toni

## 2023-08-12 LAB — MICROALBUMIN / CREATININE URINE RATIO
Creatinine, Urine: 47.2 mg/dL
Microalb/Creat Ratio: <6 mg/g{creat} (ref 0–29)
Microalbumin, Urine: <3 ug/mL

## 2023-08-16 ENCOUNTER — Telehealth: Payer: Self-pay | Admitting: Physician Assistant

## 2023-08-16 NOTE — Telephone Encounter (Signed)
 Psychology referral faxed to Dr. Maryruth Bun; [phone] Lvm notifying patient. Gave pt telephone [phone]

## 2023-11-08 ENCOUNTER — Ambulatory Visit: Payer: BC Managed Care – PPO | Admitting: Physician Assistant

## 2023-11-22 ENCOUNTER — Encounter: Payer: Self-pay | Admitting: Physician Assistant

## 2023-11-22 ENCOUNTER — Ambulatory Visit (INDEPENDENT_AMBULATORY_CARE_PROVIDER_SITE_OTHER): Admitting: Physician Assistant

## 2023-11-22 VITALS — BP 122/80 | HR 67 | Temp 97.8°F | Resp 16 | Ht 66.0 in | Wt 241.0 lb

## 2023-11-22 DIAGNOSIS — E1165 Type 2 diabetes mellitus with hyperglycemia: Secondary | ICD-10-CM

## 2023-11-22 DIAGNOSIS — F419 Anxiety disorder, unspecified: Secondary | ICD-10-CM | POA: Diagnosis not present

## 2023-11-22 DIAGNOSIS — I10 Essential (primary) hypertension: Secondary | ICD-10-CM | POA: Diagnosis not present

## 2023-11-22 LAB — POCT GLYCOSYLATED HEMOGLOBIN (HGB A1C): Hemoglobin A1C: 7 % — AB (ref 4.0–5.6)

## 2023-11-22 MED ORDER — HYDROCHLOROTHIAZIDE 12.5 MG PO CAPS: 12.5000 mg | ORAL_CAPSULE | Freq: Every day | ORAL | 1 refills | Status: DC

## 2023-11-22 MED ORDER — BISOPROLOL-HYDROCHLOROTHIAZIDE 2.5-6.25 MG PO TABS: 1.0000 | ORAL_TABLET | Freq: Every day | ORAL | 1 refills | Status: DC

## 2023-11-22 NOTE — Progress Notes (Signed)
 [facility] [address] [place], [place] [address]  Internal MEDICINE  Office Visit Note  Patient Name: Lindsey Cruz  [id number]  [id number]  Date of Service: 11/22/2023  Chief Complaint  Patient presents with   Follow-up   Diabetes   Hypertension   Quality Metric Gaps    Eye exam and mammogram    HPI Pt is here for routine follow up -needs to schedule eye exam -needs to schedule mammogram -needs shingles shot -work is offering therapy/counseling so going through them instead of referral. Also has a book that is helping with some journaling and word find in it and is helping her -states when she is doing this it helps stress and limits stress eating which helps her sugars -BP stable, needs refills  Current Medication: Outpatient Encounter Medications as of 11/22/2023  Medication Sig   Accu-Chek FastClix Lancets MISC Use as directed dx e11.65   ACCU-CHEK GUIDE test strip TEST BLOOD SUGAR EVERY DAY AS DIRECTED   atorvastatin  (LIPITOR) 40 MG tablet TAKE 1 TABLET(40 MG) BY MOUTH DAILY   EPINEPHrine  0.3 mg/0.3 mL IJ SOAJ injection Inject 0.3 mg into the muscle as needed for anaphylaxis.   ergocalciferol  (DRISDOL ) 1.25 MG (50000 UT) capsule Take one cap q week   famotidine  (PEPCID ) 20 MG tablet Take 1 tablet (20 mg total) by mouth daily for 3 days.   fluticasone  (FLONASE ) 50 MCG/ACT nasal spray Place 2 sprays into both nostrils daily.   Multiple Vitamins-Minerals (EMERGEN-C VITAMIN D /CALCIUM  PO) Take by mouth.   [DISCONTINUED] bisoprolol -hydrochlorothiazide  (ZIAC ) 2.5-6.25 MG tablet Take 1 tablet by mouth daily.   [DISCONTINUED] doxycycline  (VIBRA -TABS) 100 MG tablet Take 1 tablet (100 mg total) by mouth 2 (two) times daily.   [DISCONTINUED] hydrochlorothiazide  (MICROZIDE ) 12.5 MG capsule TAKE 1 CAPSULE(12.5 MG) BY MOUTH DAILY   bisoprolol -hydrochlorothiazide  (ZIAC ) 2.5-6.25 MG tablet Take 1 tablet by mouth daily.   hydrochlorothiazide  (MICROZIDE ) 12.5 MG capsule  Take 1 capsule (12.5 mg total) by mouth daily.   No facility-administered encounter medications on file as of 11/22/2023.    Surgical History: Past Surgical History:  Procedure Laterality Date   BREAST CYST ASPIRATION Right 2014   neg   BREAST EXCISIONAL BIOPSY Right 1983   neg   COLONOSCOPY WITH PROPOFOL  N/A 08/05/2021   Procedure: COLONOSCOPY WITH PROPOFOL ;  Surgeon: Selena Daily, MD;  Location: [facility] ENDOSCOPY;  Service: Gastroenterology;  Laterality: N/A;   TUBAL LIGATION      Medical History: Past Medical History:  Diagnosis Date   Diabetes mellitus without complication (HCC)    Hypercholesteremia    Hypertension     Family History: Family History  Problem Relation Age of Onset   Hypertension Mother    Hyperlipidemia Mother    Diabetes Mother    Diabetes Maternal Grandmother    Breast cancer Neg Hx     Social History   Socioeconomic History   Marital status: Married    Spouse name: Not on file   Number of children: Not on file   Years of education: Not on file   Highest education level: Not on file  Occupational History   Not on file  Tobacco Use   Smoking status: Never   Smokeless tobacco: Never  Vaping Use   Vaping status: Never Used  Substance and Sexual Activity   Alcohol use: Yes    Comment: occasional   Drug use: Never   Sexual activity: Not on file  Other Topics Concern   Not on file  Social History Narrative   Not on file   Social Drivers of Health   Financial Resource Strain: Not on file  Food Insecurity: Not on file  Transportation Needs: Not on file  Physical Activity: Not on file  Stress: Not on file  Social Connections: Not on file  Intimate Partner Violence: Not on file      Review of Systems  Constitutional:  Negative for chills and unexpected weight change.  HENT:  Negative for congestion, postnasal drip, rhinorrhea, sneezing and sore throat.   Eyes:  Negative for redness.  Respiratory:  Negative for cough, chest  tightness and shortness of breath.   Cardiovascular:  Negative for chest pain and palpitations.  Gastrointestinal:  Negative for abdominal pain, blood in stool, constipation, diarrhea, nausea and vomiting.  Genitourinary:  Negative for dysuria and frequency.  Musculoskeletal:  Negative for back pain, joint swelling and neck pain.  Skin:  Negative for rash.  Neurological: Negative.  Negative for tremors and numbness.  Hematological:  Negative for adenopathy. Does not bruise/bleed easily.  Psychiatric/Behavioral:  Negative for behavioral problems (Depression), sleep disturbance and suicidal ideas. The patient is not nervous/anxious.     Vital Signs: BP 122/80   Pulse 67   Temp 97.8 F (36.6 C)   Resp 16   Ht 5' 6 (1.676 m)   Wt 241 lb (109.3 kg)   SpO2 97%   BMI 38.90 kg/m    Physical Exam Vitals and nursing note reviewed.  Constitutional:      General: She is not in acute distress.    Appearance: She is well-developed. She is obese. She is not diaphoretic.  HENT:     Head: Normocephalic and atraumatic.   Eyes:     Extraocular Movements: Extraocular movements intact.   Neck:     Thyroid: No thyromegaly.     Vascular: No JVD.     Trachea: No tracheal deviation.   Cardiovascular:     Rate and Rhythm: Normal rate and regular rhythm.     Heart sounds: Normal heart sounds. No murmur heard.    No friction rub. No gallop.  Pulmonary:     Effort: Pulmonary effort is normal. No respiratory distress.     Breath sounds: No wheezing or rales.  Chest:     Chest wall: No tenderness.  Abdominal:     General: Bowel sounds are normal.   Musculoskeletal:        General: Normal range of motion.   Skin:    General: Skin is warm and dry.   Neurological:     Mental Status: She is alert and oriented to person, place, and time.   Psychiatric:        Behavior: Behavior normal.        Thought Content: Thought content normal.        Judgment: Judgment normal.         Assessment/Plan: 1. Type 2 diabetes mellitus with hyperglycemia, without long-term current use of insulin (HCC) (Primary) - POCT HgB A1C is 7.0 which is improved from 7.2 last visit. Continue to work on diet and exercise  2. Essential hypertension Stable, continue current medications - bisoprolol -hydrochlorothiazide  (ZIAC ) 2.5-6.25 MG tablet; Take 1 tablet by mouth daily.  Dispense: 90 tablet; Refill: 1 - hydrochlorothiazide  (MICROZIDE ) 12.5 MG capsule; Take 1 capsule (12.5 mg total) by mouth daily.  Dispense: 90 capsule; Refill: 1  3. Anxiety Improving, seeing therapist through work benefits    General Counseling: dalana pfahler understanding of the findings  of todays visit and agrees with plan of treatment. I have discussed any further diagnostic evaluation that may be needed or ordered today. We also reviewed her medications today. she has been encouraged to call the office with any questions or concerns that should arise related to todays visit.    Orders Placed This Encounter  Procedures   POCT HgB A1C    Meds ordered this encounter  Medications   bisoprolol -hydrochlorothiazide  (ZIAC ) 2.5-6.25 MG tablet    Sig: Take 1 tablet by mouth daily.    Dispense:  90 tablet    Refill:  1    Requesting 1 year supply   hydrochlorothiazide  (MICROZIDE ) 12.5 MG capsule    Sig: Take 1 capsule (12.5 mg total) by mouth daily.    Dispense:  90 capsule    Refill:  1    To be taken with ziac     This patient was seen by Taylor Favia, PA-C in collaboration with Dr. Verneta Gone as a part of collaborative care agreement.   Total time spent:30 Minutes Time spent includes review of chart, medications, test results, and follow up plan with the patient.      Dr Fozia M Khan Internal medicine

## 2023-11-29 ENCOUNTER — Other Ambulatory Visit
Admission: RE | Admit: 2023-11-29 | Discharge: 2023-11-29 | Disposition: A | Discharge status: Home / Self Care | Source: Ambulatory Visit | Attending: Family Medicine | Admitting: Family Medicine

## 2023-11-29 DIAGNOSIS — M7989 Other specified soft tissue disorders: Secondary | ICD-10-CM | POA: Diagnosis not present

## 2023-11-29 DIAGNOSIS — R252 Cramp and spasm: Secondary | ICD-10-CM | POA: Insufficient documentation

## 2023-11-29 LAB — D-DIMER, QUANTITATIVE: D-Dimer, Quant: 0.46 ug{FEU}/mL (ref 0.00–0.50)

## 2024-01-02 ENCOUNTER — Ambulatory Visit
Admission: RE | Admit: 2024-01-02 | Discharge: 2024-01-02 | Disposition: A | Discharge status: Home / Self Care | Source: Ambulatory Visit | Attending: Physician Assistant | Admitting: Physician Assistant

## 2024-01-02 DIAGNOSIS — Z1231 Encounter for screening mammogram for malignant neoplasm of breast: Secondary | ICD-10-CM | POA: Insufficient documentation

## 2024-02-05 DIAGNOSIS — T781XXA Other adverse food reactions, not elsewhere classified, initial encounter: Secondary | ICD-10-CM | POA: Diagnosis not present

## 2024-02-05 DIAGNOSIS — J300 Vasomotor rhinitis: Secondary | ICD-10-CM | POA: Diagnosis not present

## 2024-02-05 DIAGNOSIS — Z91013 Allergy to seafood: Secondary | ICD-10-CM | POA: Diagnosis not present

## 2024-02-14 DIAGNOSIS — F4311 Post-traumatic stress disorder, acute: Secondary | ICD-10-CM | POA: Diagnosis not present

## 2024-02-18 DIAGNOSIS — F4311 Post-traumatic stress disorder, acute: Secondary | ICD-10-CM | POA: Diagnosis not present

## 2024-02-25 ENCOUNTER — Ambulatory Visit (INDEPENDENT_AMBULATORY_CARE_PROVIDER_SITE_OTHER): Admitting: Physician Assistant

## 2024-02-25 VITALS — BP 120/70 | HR 78 | Temp 98.4°F | Resp 16 | Ht 66.0 in | Wt 240.0 lb

## 2024-02-25 DIAGNOSIS — F439 Reaction to severe stress, unspecified: Secondary | ICD-10-CM | POA: Diagnosis not present

## 2024-02-25 DIAGNOSIS — I10 Essential (primary) hypertension: Secondary | ICD-10-CM

## 2024-02-25 DIAGNOSIS — E1165 Type 2 diabetes mellitus with hyperglycemia: Secondary | ICD-10-CM | POA: Diagnosis not present

## 2024-02-25 LAB — POCT GLYCOSYLATED HEMOGLOBIN (HGB A1C): Hemoglobin A1C: 7.3 % — AB (ref 4.0–5.6)

## 2024-02-25 MED ORDER — TIRZEPATIDE 2.5 MG/0.5ML ~~LOC~~ SOAJ
2.5000 mg | SUBCUTANEOUS | 2 refills | Status: DC
Start: 2024-02-25 — End: 2024-03-27

## 2024-02-25 NOTE — Progress Notes (Signed)
 [facility] [address] [place], [place] [address]  Internal MEDICINE  Office Visit Note  Patient Name: Lindsey Cruz  [id number]  [id number]  Date of Service: 02/25/2024  Chief Complaint  Patient presents with   Follow-up    diabetes    HPI Pt is here for routine follow up -BP stable -relocated in the last 3 weeks due to separation, states husband is an alcoholic and has not admitted need for help nor will he ask for it. She states she has left previously, but went back after a week or so. This time is really making a change -living at mom's now, which is less stressful. Though sleep is not as good away from her own bed/routine -seeing therapist and it is helping. Taking one day at a time -Side effects to metformin previously -previously tolerated trulicity  and mounjaro , would like to retry mounjaro  to help get sugars better controlled -mammogram updated, eye exam on 01/02/24  Current Medication: Outpatient Encounter Medications as of 02/25/2024  Medication Sig   Accu-Chek FastClix Lancets MISC Use as directed dx e11.65   ACCU-CHEK GUIDE test strip TEST BLOOD SUGAR EVERY DAY AS DIRECTED   atorvastatin  (LIPITOR) 40 MG tablet TAKE 1 TABLET(40 MG) BY MOUTH DAILY   bisoprolol -hydrochlorothiazide  (ZIAC ) 2.5-6.25 MG tablet Take 1 tablet by mouth daily.   EPINEPHrine  0.3 mg/0.3 mL IJ SOAJ injection Inject 0.3 mg into the muscle as needed for anaphylaxis.   ergocalciferol  (DRISDOL ) 1.25 MG (50000 UT) capsule Take one cap q week   famotidine  (PEPCID ) 20 MG tablet Take 1 tablet (20 mg total) by mouth daily for 3 days.   fluticasone  (FLONASE ) 50 MCG/ACT nasal spray Place 2 sprays into both nostrils daily.   hydrochlorothiazide  (MICROZIDE ) 12.5 MG capsule Take 1 capsule (12.5 mg total) by mouth daily.   Multiple Vitamins-Minerals (EMERGEN-C VITAMIN D /CALCIUM  PO) Take by mouth.   tirzepatide  (MOUNJARO ) 2.5 MG/0.5ML Pen Inject 2.5 mg into the skin once a week.   No  facility-administered encounter medications on file as of 02/25/2024.    Surgical History: Past Surgical History:  Procedure Laterality Date   BREAST CYST ASPIRATION Right 2014   neg   BREAST EXCISIONAL BIOPSY Right 1983   neg   COLONOSCOPY WITH PROPOFOL  N/A 08/05/2021   Procedure: COLONOSCOPY WITH PROPOFOL ;  Surgeon: Unk Corinn Skiff, MD;  Location: [facility] ENDOSCOPY;  Service: Gastroenterology;  Laterality: N/A;   TUBAL LIGATION      Medical History: Past Medical History:  Diagnosis Date   Diabetes mellitus without complication (HCC)    Hypercholesteremia    Hypertension     Family History: Family History  Problem Relation Age of Onset   Hypertension Mother    Hyperlipidemia Mother    Diabetes Mother    Diabetes Maternal Grandmother    Breast cancer Neg Hx     Social History   Socioeconomic History   Marital status: Married    Spouse name: Not on file   Number of children: Not on file   Years of education: Not on file   Highest education level: Not on file  Occupational History   Not on file  Tobacco Use   Smoking status: Never   Smokeless tobacco: Never  Vaping Use   Vaping status: Never Used  Substance and Sexual Activity   Alcohol use: Yes    Comment: occasional   Drug use: Never   Sexual activity: Not on file  Other Topics Concern   Not on file  Social History  Narrative   Not on file   Social Drivers of Health   Financial Resource Strain: Low Risk  (11/29/2023)   Received from [facility]   Overall Financial Resource Strain (CARDIA)    Difficulty of Paying Living Expenses: Not very hard  Food Insecurity: No Food Insecurity (11/29/2023)   Received from [facility]   Hunger Vital Sign    Within the past 12 months, you worried that your food would run out before you got the money to buy more.: Never true    Within the past 12 months, the food you bought just didn't last and you didn't have money to get more.: Never  true  Transportation Needs: No Transportation Needs (11/29/2023)   Received from [facility] - Transportation    In the past 12 months, has lack of transportation kept you from medical appointments or from getting medications?: No    Lack of Transportation (Non-Medical): No  Physical Activity: Not on file  Stress: Not on file  Social Connections: Not on file  Intimate Partner Violence: Not on file      Review of Systems  Constitutional:  Negative for chills and unexpected weight change.  HENT:  Negative for congestion, postnasal drip, rhinorrhea, sneezing and sore throat.   Eyes:  Negative for redness.  Respiratory:  Negative for cough, chest tightness and shortness of breath.   Cardiovascular:  Negative for chest pain and palpitations.  Gastrointestinal:  Negative for abdominal pain, blood in stool, constipation, diarrhea, nausea and vomiting.  Genitourinary:  Negative for dysuria and frequency.  Musculoskeletal:  Negative for back pain, joint swelling and neck pain.  Skin:  Negative for rash.  Neurological: Negative.  Negative for tremors and numbness.  Hematological:  Negative for adenopathy. Does not bruise/bleed easily.  Psychiatric/Behavioral:  Negative for behavioral problems (Depression), sleep disturbance and suicidal ideas. The patient is not nervous/anxious.     Vital Signs: BP 120/70   Pulse 78   Temp 98.4 F (36.9 C)   Resp 16   Ht 5' 6 (1.676 m)   Wt 240 lb (108.9 kg)   LMP 05/26/2020   SpO2 96%   BMI 38.74 kg/m    Physical Exam Vitals and nursing note reviewed.  Constitutional:      General: She is not in acute distress.    Appearance: She is well-developed. She is obese. She is not diaphoretic.  HENT:     Head: Normocephalic and atraumatic.  Eyes:     Extraocular Movements: Extraocular movements intact.  Neck:     Thyroid: No thyromegaly.     Vascular: No JVD.     Trachea: No tracheal deviation.  Cardiovascular:      Rate and Rhythm: Normal rate and regular rhythm.     Heart sounds: Normal heart sounds. No murmur heard.    No friction rub. No gallop.  Pulmonary:     Effort: Pulmonary effort is normal. No respiratory distress.     Breath sounds: No wheezing or rales.  Chest:     Chest wall: No tenderness.  Abdominal:     General: Bowel sounds are normal.  Musculoskeletal:        General: Normal range of motion.  Skin:    General: Skin is warm and dry.  Neurological:     Mental Status: She is alert and oriented to person, place, and time.  Psychiatric:        Behavior: Behavior normal.  Thought Content: Thought content normal.        Judgment: Judgment normal.        Assessment/Plan: 1. Type 2 diabetes mellitus with hyperglycemia, without long-term current use of insulin (HCC) (Primary) - POCT HgB A1C is 7.3 which is up from 7.0 last visit. Will restart mounjaro  and titrate as needed - tirzepatide  (MOUNJARO ) 2.5 MG/0.5ML Pen; Inject 2.5 mg into the skin once a week.  Dispense: 2 mL; Refill: 2  2. Essential hypertension Stable, continue current medication  3. Stress More stress with separation but is managing and seeing therapist   General Counseling: ameenah prosser understanding of the findings of todays visit and agrees with plan of treatment. I have discussed any further diagnostic evaluation that may be needed or ordered today. We also reviewed her medications today. she has been encouraged to call the office with any questions or concerns that should arise related to todays visit.    Orders Placed This Encounter  Procedures   POCT HgB A1C    Meds ordered this encounter  Medications   tirzepatide  (MOUNJARO ) 2.5 MG/0.5ML Pen    Sig: Inject 2.5 mg into the skin once a week.    Dispense:  2 mL    Refill:  2    This patient was seen by Tinnie Pro, PA-C in collaboration with Dr. Sigrid Bathe as a part of collaborative care agreement.   Total time spent:30  Minutes Time spent includes review of chart, medications, test results, and follow up plan with the patient.      Dr Fozia M Khan Internal medicine

## 2024-03-11 DIAGNOSIS — F4311 Post-traumatic stress disorder, acute: Secondary | ICD-10-CM | POA: Diagnosis not present

## 2024-03-13 DIAGNOSIS — F4311 Post-traumatic stress disorder, acute: Secondary | ICD-10-CM | POA: Diagnosis not present

## 2024-03-21 ENCOUNTER — Other Ambulatory Visit: Payer: Self-pay | Admitting: Physician Assistant

## 2024-03-21 DIAGNOSIS — E782 Mixed hyperlipidemia: Secondary | ICD-10-CM

## 2024-03-27 ENCOUNTER — Ambulatory Visit (INDEPENDENT_AMBULATORY_CARE_PROVIDER_SITE_OTHER): Admitting: Physician Assistant

## 2024-03-27 ENCOUNTER — Encounter: Payer: Self-pay | Admitting: Physician Assistant

## 2024-03-27 VITALS — BP 128/78 | HR 77 | Temp 98.0°F | Resp 16 | Ht 66.0 in | Wt 239.0 lb

## 2024-03-27 DIAGNOSIS — E559 Vitamin D deficiency, unspecified: Secondary | ICD-10-CM

## 2024-03-27 DIAGNOSIS — E669 Obesity, unspecified: Secondary | ICD-10-CM

## 2024-03-27 DIAGNOSIS — E1165 Type 2 diabetes mellitus with hyperglycemia: Secondary | ICD-10-CM | POA: Diagnosis not present

## 2024-03-27 DIAGNOSIS — F4311 Post-traumatic stress disorder, acute: Secondary | ICD-10-CM | POA: Diagnosis not present

## 2024-03-27 MED ORDER — TIRZEPATIDE 5 MG/0.5ML ~~LOC~~ SOAJ: 5.0000 mg | SUBCUTANEOUS | 2 refills | Status: DC

## 2024-03-27 MED ORDER — ERGOCALCIFEROL 1.25 MG (50000 UT) PO CAPS: ORAL_CAPSULE | ORAL | 3 refills | Status: AC

## 2024-03-27 NOTE — Progress Notes (Signed)
 [facility] [address] [place], [place] [address]  Internal MEDICINE  Office Visit Note  Patient Name: Lindsey Cruz  [id number]  [id number]  Date of Service: 03/27/2024  Chief Complaint  Patient presents with   Follow-up   Diabetes   Hypertension   Hyperlipidemia   Medication Refill    Vitamin D     HPI Pt is here for routine follow up -Started on mounjaro  again and doing well with this, no S/E. Would like to go up on dose -down 1lb since last visit -BG doing ok -Walking more during work breaks -BP stable  Current Medication: Outpatient Encounter Medications as of 03/27/2024  Medication Sig   atorvastatin  (LIPITOR) 40 MG tablet TAKE 1 TABLET(40 MG) BY MOUTH DAILY   bisoprolol -hydrochlorothiazide  (ZIAC ) 2.5-6.25 MG tablet Take 1 tablet by mouth daily.   EPINEPHrine  0.3 mg/0.3 mL IJ SOAJ injection Inject 0.3 mg into the muscle as needed for anaphylaxis.   hydrochlorothiazide  (MICROZIDE ) 12.5 MG capsule Take 1 capsule (12.5 mg total) by mouth daily.   Multiple Vitamins-Minerals (EMERGEN-C VITAMIN D /CALCIUM  PO) Take by mouth.   tirzepatide  (MOUNJARO ) 5 MG/0.5ML Pen Inject 5 mg into the skin once a week.   [DISCONTINUED] Accu-Chek FastClix Lancets MISC Use as directed dx e11.65   [DISCONTINUED] ACCU-CHEK GUIDE test strip TEST BLOOD SUGAR EVERY DAY AS DIRECTED   [DISCONTINUED] ergocalciferol  (DRISDOL ) 1.25 MG (50000 UT) capsule Take one cap q week   [DISCONTINUED] famotidine  (PEPCID ) 20 MG tablet Take 1 tablet (20 mg total) by mouth daily for 3 days.   [DISCONTINUED] fluticasone  (FLONASE ) 50 MCG/ACT nasal spray Place 2 sprays into both nostrils daily.   [DISCONTINUED] tirzepatide  (MOUNJARO ) 2.5 MG/0.5ML Pen Inject 2.5 mg into the skin once a week.   ergocalciferol  (DRISDOL ) 1.25 MG (50000 UT) capsule Take one cap q week   No facility-administered encounter medications on file as of 03/27/2024.    Surgical History: Past Surgical History:  Procedure  Laterality Date   BREAST CYST ASPIRATION Right 2014   neg   BREAST EXCISIONAL BIOPSY Right 1983   neg   COLONOSCOPY WITH PROPOFOL  N/A 08/05/2021   Procedure: COLONOSCOPY WITH PROPOFOL ;  Surgeon: Unk Corinn Skiff, MD;  Location: [facility] ENDOSCOPY;  Service: Gastroenterology;  Laterality: N/A;   TUBAL LIGATION      Medical History: Past Medical History:  Diagnosis Date   Diabetes mellitus without complication (HCC)    Hypercholesteremia    Hypertension     Family History: Family History  Problem Relation Age of Onset   Hypertension Mother    Hyperlipidemia Mother    Diabetes Mother    Diabetes Maternal Grandmother    Breast cancer Neg Hx     Social History   Socioeconomic History   Marital status: Married    Spouse name: Not on file   Number of children: Not on file   Years of education: Not on file   Highest education level: Not on file  Occupational History   Not on file  Tobacco Use   Smoking status: Never   Smokeless tobacco: Never  Vaping Use   Vaping status: Never Used  Substance and Sexual Activity   Alcohol use: Yes    Comment: occasional   Drug use: Never   Sexual activity: Not on file  Other Topics Concern   Not on file  Social History Narrative   Not on file   Social Drivers of Health   Financial Resource Strain: Low Risk  (11/29/2023)   Received from  [facility]   Overall Financial Resource Strain (CARDIA)    Difficulty of Paying Living Expenses: Not very hard  Food Insecurity: No Food Insecurity (11/29/2023)   Received from [facility]   Hunger Vital Sign    Within the past 12 months, you worried that your food would run out before you got the money to buy more.: Never true    Within the past 12 months, the food you bought just didn't last and you didn't have money to get more.: Never true  Transportation Needs: No Transportation Needs (11/29/2023)   Received from [facility]  Transportation    In the past 12 months, has lack of transportation kept you from medical appointments or from getting medications?: No    Lack of Transportation (Non-Medical): No  Physical Activity: Not on file  Stress: Not on file  Social Connections: Not on file  Intimate Partner Violence: Not on file      Review of Systems  Constitutional:  Negative for chills and unexpected weight change.  HENT:  Negative for congestion, postnasal drip, rhinorrhea, sneezing and sore throat.   Eyes:  Negative for redness.  Respiratory:  Negative for cough, chest tightness and shortness of breath.   Cardiovascular:  Negative for chest pain and palpitations.  Gastrointestinal:  Negative for abdominal pain, blood in stool, constipation, diarrhea, nausea and vomiting.  Genitourinary:  Negative for dysuria and frequency.  Musculoskeletal:  Negative for back pain, joint swelling and neck pain.  Skin:  Negative for rash.  Neurological: Negative.  Negative for tremors and numbness.  Hematological:  Negative for adenopathy. Does not bruise/bleed easily.  Psychiatric/Behavioral:  Negative for behavioral problems (Depression), sleep disturbance and suicidal ideas. The patient is not nervous/anxious.     Vital Signs: BP 128/78   Pulse 77   Temp 98 F (36.7 C)   Resp 16   Ht 5' 6 (1.676 m)   Wt 239 lb (108.4 kg)   LMP 05/26/2020   SpO2 95%   BMI 38.58 kg/m    Physical Exam Vitals and nursing note reviewed.  Constitutional:      General: She is not in acute distress.    Appearance: She is well-developed. She is obese. She is not diaphoretic.  HENT:     Head: Normocephalic and atraumatic.  Eyes:     Extraocular Movements: Extraocular movements intact.  Neck:     Thyroid: No thyromegaly.     Vascular: No JVD.     Trachea: No tracheal deviation.  Cardiovascular:     Rate and Rhythm: Normal rate and regular rhythm.     Heart sounds: Normal heart sounds. No murmur heard.    No friction rub.  No gallop.  Pulmonary:     Effort: Pulmonary effort is normal. No respiratory distress.     Breath sounds: No wheezing or rales.  Chest:     Chest wall: No tenderness.  Skin:    General: Skin is warm and dry.  Neurological:     Mental Status: She is alert and oriented to person, place, and time.  Psychiatric:        Behavior: Behavior normal.        Thought Content: Thought content normal.        Judgment: Judgment normal.        Assessment/Plan: 1. Type 2 diabetes mellitus with hyperglycemia, without long-term current use of insulin (HCC) (Primary) Doing well, will increase to 5mg   mounjaro  weekly and monitor - tirzepatide  (MOUNJARO ) 5 MG/0.5ML Pen; Inject 5 mg into the skin once a week.  Dispense: 6 mL; Refill: 2  2. Vitamin D  deficiency - ergocalciferol  (DRISDOL ) 1.25 MG (50000 UT) capsule; Take one cap q week  Dispense: 12 capsule; Refill: 3  3. Obesity (BMI 30-39.9) Down 1 lbs since last visit and is working on this with increased exercise and may also see benefit of wt loss along with BG reduction on mounjaro    General Counseling: Luvern verbalizes understanding of the findings of todays visit and agrees with plan of treatment. I have discussed any further diagnostic evaluation that may be needed or ordered today. We also reviewed her medications today. she has been encouraged to call the office with any questions or concerns that should arise related to todays visit.    No orders of the defined types were placed in this encounter.   Meds ordered this encounter  Medications   tirzepatide  (MOUNJARO ) 5 MG/0.5ML Pen    Sig: Inject 5 mg into the skin once a week.    Dispense:  6 mL    Refill:  2   ergocalciferol  (DRISDOL ) 1.25 MG (50000 UT) capsule    Sig: Take one cap q week    Dispense:  12 capsule    Refill:  3    This patient was seen by Tinnie Pro, PA-C in collaboration with Dr. Sigrid Bathe as a part of collaborative care agreement.   Total time  spent:30 Minutes Time spent includes review of chart, medications, test results, and follow up plan with the patient.      Dr Fozia M Khan Internal medicine

## 2024-04-01 DIAGNOSIS — F4311 Post-traumatic stress disorder, acute: Secondary | ICD-10-CM | POA: Diagnosis not present

## 2024-04-08 DIAGNOSIS — F4311 Post-traumatic stress disorder, acute: Secondary | ICD-10-CM | POA: Diagnosis not present

## 2024-04-15 DIAGNOSIS — F4311 Post-traumatic stress disorder, acute: Secondary | ICD-10-CM | POA: Diagnosis not present

## 2024-04-28 ENCOUNTER — Ambulatory Visit
Admission: EM | Admit: 2024-04-28 | Discharge: 2024-04-28 | Disposition: A | Discharge status: Home / Self Care | Attending: Emergency Medicine | Admitting: Emergency Medicine

## 2024-04-28 ENCOUNTER — Encounter: Payer: Self-pay | Admitting: Emergency Medicine

## 2024-04-28 DIAGNOSIS — M5441 Lumbago with sciatica, right side: Secondary | ICD-10-CM | POA: Diagnosis not present

## 2024-04-28 DIAGNOSIS — S76311A Strain of muscle, fascia and tendon of the posterior muscle group at thigh level, right thigh, initial encounter: Secondary | ICD-10-CM

## 2024-04-28 MED ORDER — KETOROLAC TROMETHAMINE 30 MG/ML IJ SOLN
30.0000 mg | Freq: Once | INTRAMUSCULAR | Status: AC
  Administered 2024-04-28: 30 mg via INTRAMUSCULAR

## 2024-04-28 MED ORDER — CYCLOBENZAPRINE HCL 10 MG PO TABS: 10.0000 mg | ORAL_TABLET | Freq: Every day | ORAL | 0 refills | Status: AC

## 2024-04-28 MED ORDER — MELOXICAM 7.5 MG PO TABS: 7.5000 mg | ORAL_TABLET | Freq: Every day | ORAL | 0 refills | Status: AC

## 2024-04-28 NOTE — ED Notes (Signed)
 Patient triage by  Teresa Shelba SAUNDERS, NP

## 2024-04-28 NOTE — Discharge Instructions (Addendum)
 Your pain is most likely caused by irritation to the muscles on the right side and irritation to your hamstring which is compressing nerves causing the numbness and pain down the leg  You have been given an injection of Toradol  to help reduce inflammation and help calm pain and should start to see improvement within the next 30 minutes to an hour  Starting tomorrow you may take 1 to 2 tablets of meloxicam  once a day with food to continue the process above, may use Tylenol or any topical medicines throughout the day, we have held off on treatment with steroids due to history of diabetes  May use muscle relaxant at bedtime as needed for additional comfort and to allow you to rest  You may use heating pad in 15 minute intervals as needed for additional comfort, or you may find comfort in using ice in 10-15 minutes over affected area  Begin stretching affected area daily for 10 minutes as tolerated to further loosen muscles, stretches within the packet  When sitting and lying down place pillow underneath the back, the butt and between knees for support  If pain persist after recommended treatment or reoccurs if may be beneficial to follow up with orthopedic specialist for evaluation, this doctor specializes in the bones and can manage your symptoms long-term with options such as but not limited to imaging, medications or physical therapy

## 2024-04-28 NOTE — ED Provider Notes (Signed)
 Lindsey Cruz    CSN: [id number] Arrival date & time: 04/28/24  1928      History   Chief Complaint No chief complaint on file.   HPI Lindsey Cruz is a 58 y.o. female.   Patient presents for evaluation of right hip beginning 2 days ago and right lower back pain beginning 1 day ago, progressively worsening.  Was sitting when she felt a pop in the hip, pain started shortly after making it difficult to bear weight, has been limping to compensate.  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  Symptoms are exacerbated by sitting which causes a pulling sensation to the back of the leg, experiencing numbness and tingling all the way to the bottom of the foot on the right side only.  Has attempted use of Tylenol, magnesium and lidocaine and menthol patches.  Denies prior injury or trauma.  Denies urinary changes.   Past Medical History:  Diagnosis Date   Diabetes mellitus without complication (HCC)    Hypercholesteremia    Hypertension     Patient Active Problem List   Diagnosis Date Noted   History of colonic polyps    Type 2 diabetes mellitus with hyperglycemia (HCC) 09/24/2019   Mixed hyperlipidemia 09/24/2019   Vaginal candidiasis 09/22/2018   Diabetes mellitus without complication (HCC) 02/27/2018   Essential hypertension 02/27/2018    Past Surgical History:  Procedure Laterality Date   BREAST CYST ASPIRATION Right 2014   neg   BREAST EXCISIONAL BIOPSY Right 1983   neg   COLONOSCOPY WITH PROPOFOL  N/A 08/05/2021   Procedure: COLONOSCOPY WITH PROPOFOL ;  Surgeon: Unk Corinn Skiff, MD;  Location: [facility] ENDOSCOPY;  Service: Gastroenterology;  Laterality: N/A;   TUBAL LIGATION      OB History   No obstetric history on file.      Home Medications    Prior to Admission medications   Medication Sig Start Date End Date Taking? Authorizing Provider  atorvastatin  (LIPITOR) 40 MG  tablet TAKE 1 TABLET(40 MG) BY MOUTH DAILY 03/21/24   McDonough, Lauren K, PA-C  bisoprolol -hydrochlorothiazide  (ZIAC ) 2.5-6.25 MG tablet Take 1 tablet by mouth daily. 11/22/23   McDonough, Tinnie POUR, PA-C  EPINEPHrine  0.3 mg/0.3 mL IJ SOAJ injection Inject 0.3 mg into the muscle as needed for anaphylaxis. 03/01/23   McDonough, Tinnie POUR, PA-C  ergocalciferol  (DRISDOL ) 1.25 MG (50000 UT) capsule Take one cap q week 03/27/24   McDonough, Lauren K, PA-C  hydrochlorothiazide  (MICROZIDE ) 12.5 MG capsule Take 1 capsule (12.5 mg total) by mouth daily. 11/22/23   McDonough, Tinnie POUR, PA-C  Multiple Vitamins-Minerals (EMERGEN-C VITAMIN D /CALCIUM  PO) Take by mouth.    [provider]  tirzepatide  (MOUNJARO ) 5 MG/0.5ML Pen Inject 5 mg into the skin once a week. 03/27/24   McDonough, Tinnie POUR, PA-C    Family History Family History  Problem Relation Age of Onset   Hypertension Mother    Hyperlipidemia Mother    Diabetes Mother    Diabetes Maternal Grandmother    Breast cancer Neg Hx     Social History Social History   Tobacco Use   Smoking status: Never   Smokeless tobacco: Never  Vaping Use   Vaping status: Never Used  Substance Use Topics   Alcohol use: Not Currently    Comment: occasional   Drug use: Never     Allergies   Patient has no known allergies.   Review  of Systems Review of Systems   Physical Exam Triage Vital Signs ED Triage Vitals  Encounter Vitals Group     BP      Girls Systolic BP Percentile      Girls Diastolic BP Percentile      Boys Systolic BP Percentile      Boys Diastolic BP Percentile      Pulse      Resp      Temp      Temp src      SpO2      Weight      Height      Head Circumference      Peak Flow      Pain Score      Pain Loc      Pain Education      Exclude from Growth Chart    No data found.  Updated Vital Signs LMP 05/26/2020   Visual Acuity Right Eye Distance:   Left Eye Distance:   Bilateral Distance:    Right Eye  Near:   Left Eye Near:    Bilateral Near:     Physical Exam Constitutional:      Appearance: Normal appearance.  Eyes:     Extraocular Movements: Extraocular movements intact.  Pulmonary:     Effort: Pulmonary effort is normal.  Musculoskeletal:     Comments: Tenderness to the right groin and into the posterior right thigh without ecchymosis swelling or deformity, pain elicited with adduction and flexion of the leg, able to bear weight but pain elicited, 2+ femoral pulse  Tenderness present to the right lower lumbar region of the back without ecchymosis swelling or deformity, difficulty sitting erect with positive straight leg test on the right side  Neurological:     Mental Status: She is alert and oriented to person, place, and time. Mental status is at baseline.      UC Treatments / Results  Labs (all labs ordered are listed, but only abnormal results are displayed) Labs Reviewed - No data to display  EKG   Radiology No results found.  Procedures Procedures (including critical care time)  Medications Ordered in UC Medications - No data to display  Initial Impression / Assessment and Plan / UC Course  I have reviewed the triage vital signs and the nursing notes.  Pertinent labs & imaging results that were available during my care of the patient were reviewed by me and considered in my medical decision making (see chart for details).  Acute right-sided low back pain with right-sided sciatica, hamstring strain, right  Etiology appears to be muscular, no spinal tenderness or signs of saddle anesthesia, discussed this with patient, imaging deferred, Toradol  IM given and prescribed meloxicam  and Flexeril, defer use of steroids due to history of diabetes, last A1c 7.3, recommended supportive care through [facility], heat massage stretching with activity as tolerated, back exercises and hamstring stretches given to patient via handout, advised follow-up with urgent care or  orthopedics if symptoms continue to persist, work note given Final Clinical Impressions(s) / UC Diagnoses   Final diagnoses:  None   Discharge Instructions   None    ED Prescriptions   None    PDMP not reviewed this encounter.   Teresa Shelba SAUNDERS, NP 04/28/24 [id number]

## 2024-05-05 ENCOUNTER — Other Ambulatory Visit: Payer: Self-pay | Admitting: Physician Assistant

## 2024-05-05 DIAGNOSIS — I10 Essential (primary) hypertension: Secondary | ICD-10-CM

## 2024-05-29 ENCOUNTER — Ambulatory Visit: Admitting: Physician Assistant

## 2024-06-26 ENCOUNTER — Ambulatory Visit (INDEPENDENT_AMBULATORY_CARE_PROVIDER_SITE_OTHER): Admitting: Physician Assistant

## 2024-06-26 VITALS — BP 110/70 | HR 67 | Temp 98.0°F | Resp 16 | Ht 66.0 in | Wt 236.0 lb

## 2024-06-26 DIAGNOSIS — I10 Essential (primary) hypertension: Secondary | ICD-10-CM | POA: Diagnosis not present

## 2024-06-26 DIAGNOSIS — R5383 Other fatigue: Secondary | ICD-10-CM

## 2024-06-26 DIAGNOSIS — E538 Deficiency of other specified B group vitamins: Secondary | ICD-10-CM

## 2024-06-26 DIAGNOSIS — E782 Mixed hyperlipidemia: Secondary | ICD-10-CM | POA: Diagnosis not present

## 2024-06-26 DIAGNOSIS — E559 Vitamin D deficiency, unspecified: Secondary | ICD-10-CM

## 2024-06-26 DIAGNOSIS — E1165 Type 2 diabetes mellitus with hyperglycemia: Secondary | ICD-10-CM

## 2024-06-26 DIAGNOSIS — Z1329 Encounter for screening for other suspected endocrine disorder: Secondary | ICD-10-CM | POA: Diagnosis not present

## 2024-06-26 LAB — POCT GLYCOSYLATED HEMOGLOBIN (HGB A1C): Hemoglobin A1C: 6.8 % — AB (ref 4.0–5.6)

## 2024-06-26 MED ORDER — TIRZEPATIDE 2.5 MG/0.5ML ~~LOC~~ SOAJ: 2.5000 mg | SUBCUTANEOUS | 2 refills | Status: AC

## 2024-06-26 MED ORDER — BISOPROLOL-HYDROCHLOROTHIAZIDE 2.5-6.25 MG PO TABS: 1.0000 | ORAL_TABLET | Freq: Every day | ORAL | 1 refills | Status: AC

## 2024-06-26 MED ORDER — HYDROCHLOROTHIAZIDE 12.5 MG PO CAPS: 12.5000 mg | ORAL_CAPSULE | Freq: Every day | ORAL | 1 refills | Status: DC

## 2024-06-26 NOTE — Progress Notes (Signed)
 [facility] [address] [place], [place] [address]  Internal MEDICINE  Office Visit Note  Patient Name: Lindsey Cruz  [id number]  [id number]  Date of Service: 06/26/2024  Chief Complaint  Patient presents with   Follow-up   Medication Refill   Hyperlipidemia   Hypertension   Diabetes    HPI Pt is here for routine follow up -states it has been a month for her with the costs of scripts went up, trying [facility] to see if cheaper -lost her mom Jan 5th as well and reports she is doing ok -did not pick up mounjaro  due to cost, will try coupon now. Will lower dose since she has been without it for a few months -tried flavored sea moss and thinks that this is helping sugars -Bp stable  Current Medication: Outpatient Encounter Medications as of 06/26/2024  Medication Sig   atorvastatin  (LIPITOR) 40 MG tablet TAKE 1 TABLET(40 MG) BY MOUTH DAILY   bisoprolol -hydrochlorothiazide  (ZIAC ) 2.5-6.25 MG tablet TAKE 1 TABLET BY MOUTH DAILY   cyclobenzaprine  (FLEXERIL ) 10 MG tablet Take 1 tablet (10 mg total) by mouth at bedtime.   EPINEPHrine  0.3 mg/0.3 mL IJ SOAJ injection Inject 0.3 mg into the muscle as needed for anaphylaxis.   ergocalciferol  (DRISDOL ) 1.25 MG (50000 UT) capsule Take one cap q week   hydrochlorothiazide  (MICROZIDE ) 12.5 MG capsule Take 1 capsule (12.5 mg total) by mouth daily.   meloxicam  (MOBIC ) 7.5 MG tablet Take 1 tablet (7.5 mg total) by mouth daily.   Multiple Vitamins-Minerals (EMERGEN-C VITAMIN D /CALCIUM  PO) Take by mouth.   tirzepatide  (MOUNJARO ) 5 MG/0.5ML Pen Inject 5 mg into the skin once a week.   No facility-administered encounter medications on file as of 06/26/2024.    Surgical History: Past Surgical History:  Procedure Laterality Date   BREAST CYST ASPIRATION Right 2014   neg   BREAST EXCISIONAL BIOPSY Right 1983   neg   COLONOSCOPY WITH PROPOFOL  N/A 08/05/2021   Procedure: COLONOSCOPY WITH PROPOFOL ;  Surgeon: Unk Corinn Skiff, MD;   Location: [facility] ENDOSCOPY;  Service: Gastroenterology;  Laterality: N/A;   TUBAL LIGATION      Medical History: Past Medical History:  Diagnosis Date   Diabetes mellitus without complication (HCC)    Hypercholesteremia    Hypertension     Family History: Family History  Problem Relation Age of Onset   Hypertension Mother    Hyperlipidemia Mother    Diabetes Mother    Diabetes Maternal Grandmother    Breast cancer Neg Hx     Social History   Socioeconomic History   Marital status: Married    Spouse name: Not on file   Number of children: Not on file   Years of education: Not on file   Highest education level: Not on file  Occupational History   Not on file  Tobacco Use   Smoking status: Never   Smokeless tobacco: Never  Vaping Use   Vaping status: Never Used  Substance and Sexual Activity   Alcohol use: Not Currently    Comment: occasional   Drug use: Never   Sexual activity: Not on file  Other Topics Concern   Not on file  Social History Narrative   Not on file   Social Drivers of Health   Tobacco Use: Low Risk (04/28/2024)   Patient History    Smoking Tobacco Use: Never    Smokeless Tobacco Use: Never    Passive Exposure: Not on file  Financial Resource Strain: Low Risk  (  11/29/2023)   Received from [facility]   Overall Financial Resource Strain (CARDIA)    Difficulty of Paying Living Expenses: Not very hard  Food Insecurity: No Food Insecurity (11/29/2023)   Received from [facility]   Epic    Within the past 12 months, you worried that your food would run out before you got the money to buy more.: Never true    Within the past 12 months, the food you bought just didn't last and you didn't have money to get more.: Never true  Transportation Needs: No Transportation Needs (11/29/2023)   Received from [facility] - Transportation    In the past 12 months, has lack of transportation kept you  from medical appointments or from getting medications?: No    Lack of Transportation (Non-Medical): No  Physical Activity: Not on file  Stress: Not on file  Social Connections: Not on file  Intimate Partner Violence: Not on file  Depression (PHQ2-9): Low Risk (06/26/2024)   Depression (PHQ2-9)    PHQ-2 Score: 0  Alcohol Screen: Low Risk (02/23/2022)   Alcohol Screen    Last Alcohol Screening Score (AUDIT): 2  Housing: Low Risk  (11/29/2023)   Received from [facility]   Epic    In the last 12 months, was there a time when you were not able to pay the mortgage or rent on time?: No    In the past 12 months, how many times have you moved where you were living?: 0    At any time in the past 12 months, were you homeless or living in a shelter (including now)?: No  Utilities: Not At Risk (11/29/2023)   Received from [facility]   Epic    In the past 12 months has the electric, gas, oil, or water company threatened to shut off services in your home?: No  Health Literacy: Not on file      Review of Systems  Constitutional:  Negative for chills and unexpected weight change.  HENT:  Negative for congestion, postnasal drip, rhinorrhea, sneezing and sore throat.   Eyes:  Negative for redness.  Respiratory:  Negative for cough, chest tightness and shortness of breath.   Cardiovascular:  Negative for chest pain and palpitations.  Gastrointestinal:  Negative for abdominal pain, blood in stool, constipation, diarrhea, nausea and vomiting.  Genitourinary:  Negative for dysuria and frequency.  Musculoskeletal:  Negative for back pain, joint swelling and neck pain.  Skin:  Negative for rash.  Neurological: Negative.  Negative for tremors and numbness.  Hematological:  Negative for adenopathy. Does not bruise/bleed easily.  Psychiatric/Behavioral:  Negative for behavioral problems (Depression), sleep disturbance and suicidal ideas. The patient is not  nervous/anxious.     Vital Signs: BP 110/70   Pulse 67   Temp 98 F (36.7 C)   Resp 16   Ht 5' 6 (1.676 m)   Wt 236 lb (107 kg)   LMP 05/26/2020   SpO2 99%   BMI 38.09 kg/m    Physical Exam Vitals and nursing note reviewed.  Constitutional:      General: She is not in acute distress.    Appearance: She is well-developed. She is obese. She is not diaphoretic.  HENT:     Head: Normocephalic and atraumatic.  Eyes:     Extraocular Movements: Extraocular movements intact.  Neck:     Thyroid: No thyromegaly.     Vascular:  No JVD.     Trachea: No tracheal deviation.  Cardiovascular:     Rate and Rhythm: Normal rate and regular rhythm.     Heart sounds: Normal heart sounds. No murmur heard.    No friction rub. No gallop.  Pulmonary:     Effort: Pulmonary effort is normal. No respiratory distress.     Breath sounds: No wheezing or rales.  Chest:     Chest wall: No tenderness.  Skin:    General: Skin is warm and dry.  Neurological:     Mental Status: She is alert and oriented to person, place, and time.  Psychiatric:        Behavior: Behavior normal.        Thought Content: Thought content normal.        Judgment: Judgment normal.        Assessment/Plan: 1. Type 2 diabetes mellitus with hyperglycemia, without long-term current use of insulin (HCC) (Primary) - POCT HgB A1C is 6.8 which is improved from 7.3 last visit despite being without mounjaro  for over 1 month. Will plan to restart with using coupon card to see if more affordable. Continue to work on diet/exercise - tirzepatide  (MOUNJARO ) 2.5 MG/0.5ML Pen; Inject 2.5 mg into the skin once a week.  Dispense: 2 mL; Refill: 2  2. Essential hypertension Stable, continue current medications, will send via [facility] due to med cost at local pharmacy increasing - bisoprolol -hydrochlorothiazide  (ZIAC ) 2.5-6.25 MG tablet; Take 1 tablet by mouth daily.  Dispense: 90 tablet; Refill: 1 - hydrochlorothiazide  (MICROZIDE ) 12.5  MG capsule; Take 1 capsule (12.5 mg total) by mouth daily.  Dispense: 90 capsule; Refill: 1  3. Vitamin D  deficiency - VITAMIN D  25 Hydroxy (Vit-D Deficiency, Fractures)  4. Mixed hyperlipidemia - Lipid Panel With LDL/HDL Ratio  5. Thyroid disorder screen - TSH + free T4  6. B12 deficiency - B12 and Folate Panel  7. Other fatigue - CBC w/Diff/Platelet - Comprehensive metabolic panel with GFR - TSH + free T4 - Lipid Panel With LDL/HDL Ratio - VITAMIN D  25 Hydroxy (Vit-D Deficiency, Fractures) - Fe+TIBC+Fer - B12 and Folate Panel   General Counseling: Arshi verbalizes understanding of the findings of todays visit and agrees with plan of treatment. I have discussed any further diagnostic evaluation that may be needed or ordered today. We also reviewed her medications today. she has been encouraged to call the office with any questions or concerns that should arise related to todays visit.    No orders of the defined types were placed in this encounter.   No orders of the defined types were placed in this encounter.   This patient was seen by Tinnie Pro, PA-C in collaboration with Dr. Sigrid Bathe as a part of collaborative care agreement.   Total time spent:30 Minutes Time spent includes review of chart, medications, test results, and follow up plan with the patient.      Dr Fozia M Khan Internal medicine

## 2024-07-03 LAB — IRON,TIBC AND FERRITIN PANEL
Ferritin: 147 ng/mL (ref 15–150)
Iron Saturation: 25 % (ref 15–55)
Iron: 79 ug/dL (ref 27–159)
Total Iron Binding Capacity: 317 ug/dL (ref 250–450)
UIBC: 238 ug/dL (ref 131–425)

## 2024-07-03 LAB — COMPREHENSIVE METABOLIC PANEL WITH GFR
ALT: 17 IU/L (ref 0–32)
AST: 17 IU/L (ref 0–40)
Albumin: 4.3 g/dL (ref 3.8–4.9)
Alkaline Phosphatase: 72 IU/L (ref 49–135)
BUN/Creatinine Ratio: 21 (ref 9–23)
BUN: 15 mg/dL (ref 6–24)
Bilirubin Total: 0.3 mg/dL (ref 0.0–1.2)
CO2: 23 mmol/L (ref 20–29)
Calcium: 9.3 mg/dL (ref 8.7–10.2)
Chloride: 103 mmol/L (ref 96–106)
Creatinine, Ser: 0.71 mg/dL (ref 0.57–1.00)
Globulin, Total: 2.7 g/dL (ref 1.5–4.5)
Glucose: 123 mg/dL — ABNORMAL HIGH (ref 70–99)
Potassium: 4.4 mmol/L (ref 3.5–5.2)
Sodium: 139 mmol/L (ref 134–144)
Total Protein: 7 g/dL (ref 6.0–8.5)
eGFR: 99 mL/min/1.73

## 2024-07-03 LAB — CBC WITH DIFFERENTIAL/PLATELET
Basophils Absolute: 0 x10E3/uL (ref 0.0–0.2)
Basos: 1 %
EOS (ABSOLUTE): 0.1 x10E3/uL (ref 0.0–0.4)
Eos: 2 %
Hematocrit: 38.8 % (ref 34.0–46.6)
Hemoglobin: 13.1 g/dL (ref 11.1–15.9)
Immature Grans (Abs): 0 x10E3/uL (ref 0.0–0.1)
Immature Granulocytes: 0 %
Lymphocytes Absolute: 2 x10E3/uL (ref 0.7–3.1)
Lymphs: 34 %
MCH: 31.3 pg (ref 26.6–33.0)
MCHC: 33.8 g/dL (ref 31.5–35.7)
MCV: 93 fL (ref 79–97)
Monocytes Absolute: 0.7 x10E3/uL (ref 0.1–0.9)
Monocytes: 12 %
Neutrophils Absolute: 3 x10E3/uL (ref 1.4–7.0)
Neutrophils: 51 %
Platelets: 224 x10E3/uL (ref 150–450)
RBC: 4.18 x10E6/uL (ref 3.77–5.28)
RDW: 12.7 % (ref 11.7–15.4)
WBC: 5.9 x10E3/uL (ref 3.4–10.8)

## 2024-07-03 LAB — B12 AND FOLATE PANEL
Folate: 15.4 ng/mL
Vitamin B-12: 403 pg/mL (ref 232–1245)

## 2024-07-03 LAB — LIPID PANEL WITH LDL/HDL RATIO
Cholesterol, Total: 219 mg/dL — ABNORMAL HIGH (ref 100–199)
HDL: 50 mg/dL
LDL Chol Calc (NIH): 151 mg/dL — ABNORMAL HIGH (ref 0–99)
LDL/HDL Ratio: 3 ratio (ref 0.0–3.2)
Triglycerides: 101 mg/dL (ref 0–149)
VLDL Cholesterol Cal: 18 mg/dL (ref 5–40)

## 2024-07-03 LAB — VITAMIN D 25 HYDROXY (VIT D DEFICIENCY, FRACTURES): Vit D, 25-Hydroxy: 33.1 ng/mL (ref 30.0–100.0)

## 2024-07-03 LAB — TSH+FREE T4
Free T4: 1.41 ng/dL (ref 0.82–1.77)
TSH: 1.33 u[IU]/mL (ref 0.450–4.500)

## 2024-07-15 ENCOUNTER — Other Ambulatory Visit: Payer: Self-pay

## 2024-07-15 DIAGNOSIS — I10 Essential (primary) hypertension: Secondary | ICD-10-CM

## 2024-07-15 DIAGNOSIS — E782 Mixed hyperlipidemia: Secondary | ICD-10-CM

## 2024-07-15 MED ORDER — HYDROCHLOROTHIAZIDE 12.5 MG PO CAPS: 12.5000 mg | ORAL_CAPSULE | Freq: Every day | ORAL | 1 refills | Status: AC

## 2024-07-15 MED ORDER — ATORVASTATIN CALCIUM 40 MG PO TABS: ORAL_TABLET | ORAL | 3 refills | Status: AC

## 2024-07-16 ENCOUNTER — Ambulatory Visit: Payer: Self-pay | Admitting: Physician Assistant

## 2024-07-24 ENCOUNTER — Encounter: Payer: BC Managed Care – PPO | Admitting: Physician Assistant

## 2024-08-11 ENCOUNTER — Encounter: Payer: BC Managed Care – PPO | Admitting: Physician Assistant
